# Patient Record
Sex: Female | Born: 1980 | Hispanic: No | Marital: Married | Smoking: Never smoker
Health system: Southern US, Community
[De-identification: ages and names within clinical notes are randomized; demographics above are authoritative.]

## PROBLEM LIST (undated history)

## (undated) ENCOUNTER — Inpatient Hospital Stay (HOSPITAL_COMMUNITY): Payer: Self-pay

## (undated) DIAGNOSIS — O34219 Maternal care for unspecified type scar from previous cesarean delivery: Secondary | ICD-10-CM

## (undated) DIAGNOSIS — O26612 Liver and biliary tract disorders in pregnancy, second trimester: Secondary | ICD-10-CM

## (undated) DIAGNOSIS — F329 Major depressive disorder, single episode, unspecified: Secondary | ICD-10-CM

## (undated) DIAGNOSIS — D509 Iron deficiency anemia, unspecified: Secondary | ICD-10-CM

## (undated) DIAGNOSIS — F32A Depression, unspecified: Secondary | ICD-10-CM

## (undated) DIAGNOSIS — D62 Acute posthemorrhagic anemia: Secondary | ICD-10-CM

## (undated) DIAGNOSIS — K5909 Other constipation: Secondary | ICD-10-CM

## (undated) DIAGNOSIS — K831 Obstruction of bile duct: Secondary | ICD-10-CM

## (undated) DIAGNOSIS — E559 Vitamin D deficiency, unspecified: Secondary | ICD-10-CM

## (undated) HISTORY — DX: Other constipation: K59.09

## (undated) HISTORY — DX: Vitamin D deficiency, unspecified: E55.9

---

## 2011-10-25 ENCOUNTER — Other Ambulatory Visit: Payer: Self-pay | Admitting: Infectious Diseases

## 2011-10-25 ENCOUNTER — Ambulatory Visit
Admission: RE | Admit: 2011-10-25 | Discharge: 2011-10-25 | Disposition: A | Discharge status: Home / Self Care | Payer: No Typology Code available for payment source | Source: Ambulatory Visit | Attending: Infectious Diseases | Admitting: Infectious Diseases

## 2011-10-25 DIAGNOSIS — R7611 Nonspecific reaction to tuberculin skin test without active tuberculosis: Secondary | ICD-10-CM

## 2012-07-28 ENCOUNTER — Encounter (HOSPITAL_COMMUNITY): Payer: Self-pay | Admitting: *Deleted

## 2012-07-28 ENCOUNTER — Inpatient Hospital Stay (HOSPITAL_COMMUNITY): Payer: BC Managed Care – PPO

## 2012-07-28 ENCOUNTER — Inpatient Hospital Stay (HOSPITAL_COMMUNITY)
Admission: AD | Admit: 2012-07-28 | Discharge: 2012-07-28 | Disposition: A | Discharge status: Home / Self Care | Payer: BC Managed Care – PPO | Source: Ambulatory Visit | Attending: Obstetrics & Gynecology | Admitting: Obstetrics & Gynecology

## 2012-07-28 DIAGNOSIS — Z3490 Encounter for supervision of normal pregnancy, unspecified, unspecified trimester: Secondary | ICD-10-CM

## 2012-07-28 DIAGNOSIS — R55 Syncope and collapse: Secondary | ICD-10-CM

## 2012-07-28 DIAGNOSIS — R109 Unspecified abdominal pain: Secondary | ICD-10-CM | POA: Insufficient documentation

## 2012-07-28 DIAGNOSIS — M549 Dorsalgia, unspecified: Secondary | ICD-10-CM | POA: Insufficient documentation

## 2012-07-28 DIAGNOSIS — O99891 Other specified diseases and conditions complicating pregnancy: Secondary | ICD-10-CM | POA: Insufficient documentation

## 2012-07-28 LAB — URINALYSIS, ROUTINE W REFLEX MICROSCOPIC
Bilirubin Urine: NEGATIVE
Ketones, ur: NEGATIVE mg/dL
Protein, ur: NEGATIVE mg/dL
Urobilinogen, UA: 0.2 mg/dL (ref 0.0–1.0)

## 2012-07-28 LAB — URINE MICROSCOPIC-ADD ON

## 2012-07-28 NOTE — MAU Note (Signed)
Lower abd & back pain x 1 week, worse at night.  White discharge, no bleeding.  +HPT & +UPT at medical clinic.

## 2012-07-28 NOTE — MAU Provider Note (Signed)
Chief Complaint: Abdominal Pain and Back Pain   First Provider Initiated Contact with Patient 07/28/12 1742     SUBJECTIVE HPI: Kristine [name] is a 31 y.o. G1P0 at [redacted]w[redacted]d by LMP who presents with 1 wk hx lower abd crampy pain and LBP. Vaginal discharge not irritative but more than usual amount. No vaginal bleeding. No dysuria, urgency, frequency. No N/V/C/D/fever.   No past medical history on file. OB History    Grav Para Term Preterm Abortions TAB SAB Ect Mult Living   1              # Outc Date GA Lbr Len/2nd Wgt Sex Del Anes PTL Lv   1 CUR              No past surgical history on file. History   Social History  . Marital Status: Married    Spouse Name: N/A    Number of Children: N/A  . Years of Education: N/A   Occupational History  . Not on file.   Social History Main Topics  . Smoking status: Never Smoker   . Smokeless tobacco: Not on file  . Alcohol Use: No  . Drug Use: No  . Sexually Active:    Other Topics Concern  . Not on file   Social History Narrative  . No narrative on file   No current facility-administered medications on file prior to encounter.   No current outpatient prescriptions on file prior to encounter.   No Known Allergies  ROS: Pertinent items in HPI  OBJECTIVE Blood pressure 101/68, pulse 84, temperature 97.9 F (36.6 C), temperature source Oral, resp. rate 16, height 5\' 3"  (1.6 m), weight 133 lb 12.8 oz (60.691 kg), last menstrual period 05/28/2012. GENERAL: Well-developed, well-nourished female in no acute distress.  HEENT: Normocephalic HEART: normal rate RESP: normal effort ABDOMEN: Soft, non-tender EXTREMITIES: Nontender, no edema NEURO: Alert and oriented SPECULUM EXAM: NEFG, physiologic discharge, no blood noted, cervix clean BIMANUAL: cervix L/C; uterus 8 wk size, no adnexal tenderness or masses  LAB RESULTS Results for orders placed during the hospital encounter of 07/28/12 (from the past 24 hour(s))  WET PREP, GENITAL      Status: Abnormal   Collection Time   07/28/12  5:50 PM      Component Value Range   Yeast Wet Prep HPF POC NONE SEEN  NONE SEEN   Trich, Wet Prep NONE SEEN  NONE SEEN   Clue Cells Wet Prep HPF POC NONE SEEN  NONE SEEN   WBC, Wet Prep HPF POC FEW (*) NONE SEEN    IMAGING Bedside US> fetal activity and normal HR *RADIOLOGY REPORT*  Clinical Data: 31 year old female with pain, positive urine pregnancy test. Gestational age by LMP 8 weeks 5 days.  OBSTETRIC <14 WK ULTRASOUND  Technique: Transabdominal ultrasound was performed for evaluation of the gestation as well as the maternal uterus and adnexal regions.  Comparison: None.  Intrauterine gestational sac: Single Yolk sac: Visible Embryo: Visible Cardiac Activity: Detected Heart Rate: 176 bpm  CRL: 2.4 mm 9 w 1 d [place] EDC: 03/01/2013  Maternal uterus/Adnexae: No subchorionic hemorrhage or pelvic free fluid. Normal left ovary measuring 2.9 x 1.7 x 1.5 cm. Slightly larger right ovary likely containing a corpus luteum measures 3.4 x 2.9 x 2.6 cm (and corpus luteum up to 2.6 cm.  IMPRESSION: Viable singleton intrauterine pregnancy with estimated gestational age of [redacted] weeks and 1 days by crown-rump length.   Original Report Authenticated By: Odessa Fleming  III, M.D.    ASSESSMENT 1. Abdominal pain complicating pregnancy   2. Viable pregnancy    G1 @[redacted]w[redacted]d  BEGA by LMP confirmed by [place]  PLAN Discharge home    Medication List     As of 07/28/2012  7:33 PM    TAKE these medications         prenatal multivitamin Tabs   Take 1 tablet by mouth at bedtime.          Danae Orleans, CNM 07/28/2012  5:54 PM

## 2012-07-28 NOTE — MAU Provider Note (Signed)
Attestation of Attending Supervision of Advanced Practitioner (CNM/NP): Evaluation and management procedures were performed by the Advanced Practitioner under my supervision and collaboration.  I have reviewed the Advanced Practitioner's note and chart, and I agree with the management and plan.  HARRAWAY-SMITH, Duane Trias 7:41 PM     

## 2012-07-29 LAB — GC/CHLAMYDIA PROBE AMP: GC Probe RNA: NEGATIVE

## 2012-08-11 ENCOUNTER — Inpatient Hospital Stay (HOSPITAL_COMMUNITY)
Admission: AD | Admit: 2012-08-11 | Discharge: 2012-08-11 | Disposition: A | Discharge status: Home / Self Care | Payer: BC Managed Care – PPO | Source: Ambulatory Visit | Attending: Obstetrics and Gynecology | Admitting: Obstetrics and Gynecology

## 2012-08-11 ENCOUNTER — Encounter (HOSPITAL_COMMUNITY): Payer: Self-pay | Admitting: *Deleted

## 2012-08-11 DIAGNOSIS — R55 Syncope and collapse: Secondary | ICD-10-CM

## 2012-08-11 DIAGNOSIS — O2650 Maternal hypotension syndrome, unspecified trimester: Secondary | ICD-10-CM | POA: Insufficient documentation

## 2012-08-11 DIAGNOSIS — Z3490 Encounter for supervision of normal pregnancy, unspecified, unspecified trimester: Secondary | ICD-10-CM

## 2012-08-11 LAB — COMPREHENSIVE METABOLIC PANEL
ALT: 11 U/L (ref 0–35)
AST: 12 U/L (ref 0–37)
Albumin: 3.4 g/dL — ABNORMAL LOW (ref 3.5–5.2)
CO2: 18 mEq/L — ABNORMAL LOW (ref 19–32)
Calcium: 8.8 mg/dL (ref 8.4–10.5)
GFR calc non Af Amer: >90 mL/min (ref >90)
Sodium: 133 mEq/L — ABNORMAL LOW (ref 135–145)

## 2012-08-11 LAB — CBC WITH DIFFERENTIAL/PLATELET
Basophils Absolute: 0 10*3/uL (ref 0.0–0.1)
Eosinophils Relative: 1 % (ref 0–5)
Lymphocytes Relative: 34 % (ref 12–46)
MCV: 86.4 fL (ref 78.0–100.0)
Neutro Abs: 4.7 10*3/uL (ref 1.7–7.7)
Platelets: 264 10*3/uL (ref 150–400)
RDW: 13 % (ref 11.5–15.5)
WBC: 7.9 10*3/uL (ref 4.0–10.5)

## 2012-08-11 LAB — URINALYSIS, ROUTINE W REFLEX MICROSCOPIC
Bilirubin Urine: NEGATIVE
Glucose, UA: NEGATIVE mg/dL
Specific Gravity, Urine: 1.01 (ref 1.005–1.030)
Urobilinogen, UA: 0.2 mg/dL (ref 0.0–1.0)
pH: 8 (ref 5.0–8.0)

## 2012-08-11 LAB — URINE MICROSCOPIC-ADD ON

## 2012-08-11 MED ORDER — DEXTROSE IN LACTATED RINGERS 5 % IV SOLN
INTRAVENOUS | Status: DC
  Administered 2012-08-11: 11:00:00 via INTRAVENOUS

## 2012-08-11 NOTE — MAU Note (Signed)
Pt refused wc to car

## 2012-08-11 NOTE — MAU Note (Signed)
Body feels numb- heavy, can not move. But can feel touch.

## 2012-08-11 NOTE — MAU Provider Note (Signed)
History     CSN: [id number]  Arrival date and time: 08/11/12 4098   None     Chief Complaint  Patient presents with  . Loss of Consciousness   HPI Aimi Essner is 31 y.o. G1P0 [redacted]w[redacted]d weeks presented by EMS after a syncopal episode at work.  She has not had anything to eat since 9pm last.  Episode was witnessed.  Patient slide down to the floor.  Denies injury.  States her body is all numb and she is having difficulty speaking per her report.  Her mother is here and denies history of seizures.  She has not begun prenatal care, waiting for Medicaid approval.   Patient is able to answer questions.      History reviewed. No pertinent past medical history.  History reviewed. No pertinent past surgical history.  Family History  Problem Relation Age of Onset  . Other Neg Hx   . Hearing loss Neg Hx     History  Substance Use Topics  . Smoking status: Never Smoker   . Smokeless tobacco: Never Used  . Alcohol Use: No    Allergies: No Known Allergies  Prescriptions prior to admission  Medication Sig Dispense Refill  . Prenatal Vit-Fe Fumarate-FA (PRENATAL MULTIVITAMIN) TABS Take 1 tablet by mouth at bedtime.        Review of Systems  Neurological: Positive for speech change.       Generalized numbness.  Difficulty speaking  Syncope   Physical Exam   Last menstrual period 05/28/2012.  Physical Exam  Constitutional: She appears well-developed and well-nourished.       Difficulty speaking.  HENT:  Head: Normocephalic.  Respiratory: Effort normal.  Genitourinary:       Not indicated  Neurological:       Patient unable to squeeze by fingers.  Speech is quiet, difficult to ellicit conversation but does answer questions  Skin: Skin is warm and dry.   Results for orders placed during the hospital encounter of 08/11/12 (from the past 24 hour(s))  CBC WITH DIFFERENTIAL     Status: Abnormal   Collection Time   08/11/12 10:35 AM      Component Value Range   WBC 7.9   4.0 - 10.5 K/uL   RBC 3.81 (*) 3.87 - 5.11 MIL/uL   Hemoglobin 11.6 (*) 12.0 - 15.0 g/dL   HCT 11.9 (*) 14.7 - 82.9 %   MCV 86.4  78.0 - 100.0 fL   MCH 30.4  26.0 - 34.0 pg   MCHC 35.3  30.0 - 36.0 g/dL   RDW 56.2  13.0 - 86.5 %   Platelets 264  150 - 400 K/uL   Neutrophils Relative 60  43 - 77 %   Neutro Abs 4.7  1.7 - 7.7 K/uL   Lymphocytes Relative 34  12 - 46 %   Lymphs Abs 2.7  0.7 - 4.0 K/uL   Monocytes Relative 6  3 - 12 %   Monocytes Absolute 0.4  0.1 - 1.0 K/uL   Eosinophils Relative 1  0 - 5 %   Eosinophils Absolute 0.1  0.0 - 0.7 K/uL   Basophils Relative 0  0 - 1 %   Basophils Absolute 0.0  0.0 - 0.1 K/uL  COMPREHENSIVE METABOLIC PANEL     Status: Abnormal   Collection Time   08/11/12 10:35 AM      Component Value Range   Sodium 133 (*) 135 - 145 mEq/L   Potassium 3.0 (*) 3.5 -  5.1 mEq/L   Chloride 102  96 - 112 mEq/L   CO2 18 (*) 19 - 32 mEq/L   Glucose, Bld 90  70 - 99 mg/dL   BUN 7  6 - 23 mg/dL   Creatinine, Ser 1.61 (*) 0.50 - 1.10 mg/dL   Calcium 8.8  8.4 - 09.6 mg/dL   Total Protein 6.3  6.0 - 8.3 g/dL   Albumin 3.4 (*) 3.5 - 5.2 g/dL   AST 12  0 - 37 U/L   ALT 11  0 - 35 U/L   Alkaline Phosphatase 50  39 - 117 U/L   Total Bilirubin 0.6  0.3 - 1.2 mg/dL   GFR calc non Af Amer >90  >90 mL/min   GFR calc Af Amer >90  >90 mL/min  URINALYSIS, ROUTINE W REFLEX MICROSCOPIC     Status: Abnormal   Collection Time   08/11/12 10:55 AM      Component Value Range   Color, Urine YELLOW  YELLOW   APPearance CLEAR  CLEAR   Specific Gravity, Urine 1.010  1.005 - 1.030   pH 8.0  5.0 - 8.0   Glucose, UA NEGATIVE  NEGATIVE mg/dL   Hgb urine dipstick TRACE (*) NEGATIVE   Bilirubin Urine NEGATIVE  NEGATIVE   Ketones, ur 15 (*) NEGATIVE mg/dL   Protein, ur NEGATIVE  NEGATIVE mg/dL   Urobilinogen, UA 0.2  0.0 - 1.0 mg/dL   Nitrite NEGATIVE  NEGATIVE   Leukocytes, UA NEGATIVE  NEGATIVE  URINE MICROSCOPIC-ADD ON     Status: Abnormal   Collection Time   08/11/12  10:55 AM      Component Value Range   Squamous Epithelial / LPF FEW (*) RARE   WBC, UA 0-2  <3 WBC/hpf    MAU Course  Procedures  MDM 10:32  Reported MSE to Dr. Jolayne Panther.  Asked her to come to evaluate patient. 10:36  Dr. Jolayne Panther in the room.  She examined patient as well.   CBC diff and CMP pending.  Dr. Jolayne Panther said if labs are normal, she spoke to the patient about sending her for neuro eval at [facility].  Will continue IV hydration.   12:00 Jolynn, RN reports the patient is much better with  Normal orthostatic BPs.  Reported to Dr. Jolayne Panther.  May discharge to home.  No further evaluation needed. I went in to see patient.  She is alert, oriented, talking, smiling and ready for discharge.  Discussed importance to eat 3 meals and mid-meal snack and stay well hydrated.  Prenatal Vits daily.  Begin prenatal care  Assessment and Plan  A:  Syncopal episode at [redacted]w[redacted]d      IV hydration  P:  Discussed importance of staying well hydrated and eat 5-6 small meals a day     Prenatal Vits qd     Add bananas to diet for K+    Begin prenatal care  Romeka Scifres,EVE M 08/11/2012, 10:25 AM

## 2012-08-11 NOTE — MAU Provider Note (Signed)
Attestation of Attending Supervision of Advanced Practitioner (CNM/NP): Evaluation and management procedures were performed by the Advanced Practitioner under my supervision and collaboration.  I have reviewed the Advanced Practitioner's note and chart, and I agree with the management and plan.  Jaidev Sanger 08/11/2012 3:25 PM

## 2012-08-11 NOTE — MAU Note (Addendum)
Has been having nausea and vomiting, denies diarrhea or fever. Was at work, became dizzy- "sat down' on the floor".  Co-worker told emds she passed out.  Pt confused on arrival- states was at work.  Was sick last night.  Can not remember birthday.

## 2012-08-11 NOTE — MAU Note (Signed)
"  she's back". Alert, color has improved, eyes bright and responsive.  Able to move all limbs; numbness is gone.  Feeling better. Conversing- oriented. Knows birthdate now.

## 2012-09-11 LAB — OB RESULTS CONSOLE HIV ANTIBODY (ROUTINE TESTING): HIV: NONREACTIVE

## 2012-09-11 LAB — OB RESULTS CONSOLE RUBELLA ANTIBODY, IGM: Rubella: IMMUNE

## 2012-09-11 LAB — OB RESULTS CONSOLE HEPATITIS B SURFACE ANTIGEN: Hepatitis B Surface Ag: NEGATIVE

## 2012-09-16 LAB — OB RESULTS CONSOLE GC/CHLAMYDIA
Chlamydia: NEGATIVE
Gonorrhea: NEGATIVE

## 2012-10-26 ENCOUNTER — Inpatient Hospital Stay (HOSPITAL_COMMUNITY): Payer: BC Managed Care – PPO

## 2012-10-26 ENCOUNTER — Encounter (HOSPITAL_COMMUNITY): Payer: Self-pay | Admitting: Obstetrics and Gynecology

## 2012-10-26 ENCOUNTER — Inpatient Hospital Stay (HOSPITAL_COMMUNITY)
Admission: AD | Admit: 2012-10-26 | Discharge: 2012-10-26 | Disposition: A | Discharge status: Home / Self Care | Payer: BC Managed Care – PPO | Source: Ambulatory Visit | Attending: Obstetrics & Gynecology | Admitting: Obstetrics & Gynecology

## 2012-10-26 DIAGNOSIS — O99891 Other specified diseases and conditions complicating pregnancy: Secondary | ICD-10-CM | POA: Insufficient documentation

## 2012-10-26 LAB — URINALYSIS, ROUTINE W REFLEX MICROSCOPIC
Leukocytes, UA: NEGATIVE
Nitrite: NEGATIVE
Specific Gravity, Urine: 1.015 (ref 1.005–1.030)
Urobilinogen, UA: 0.2 mg/dL (ref 0.0–1.0)
pH: 7 (ref 5.0–8.0)

## 2012-10-26 LAB — POCT FERN TEST: POCT Fern Test: NEGATIVE

## 2012-10-26 NOTE — MAU Note (Addendum)
"  At 0930 this morning, I wasn't having any pain but a lot of water came out.  I thought I was peeing on myself, but I had just gone to the [place].  My pants and underwear all got wet.  About 1.5-2 hours later, I started having pressure in my vagina.  My friend called the afterhours nurse and she told us to come right away."

## 2012-10-26 NOTE — Progress Notes (Signed)
Dr. Juliene Pina performing bedside U/S

## 2012-10-26 NOTE — MAU Note (Signed)
Pt presents with complaints of a large gush of clear fluid at 9:30 this morning, States some right lower back pain since she had the leakage. Denies any bleeding

## 2012-10-26 NOTE — MAU Provider Note (Signed)
  History     CSN: [id number]  Arrival date and time: 10/26/12 1420  First Provider Initiated Contact with Patient 10/26/12 1454    Chief Complaint  Patient presents with  . Possible rupture of membranes    HPI 32 yo, G1 at 22 wks, here for leaking fluid and pelvic pressure. Felt gush of fluid at 10 am this morning and cramping since few hours. Was seen in office few days back for decreased FMs, but has been feeling good FMs. No vaginal bleeding.   Family History  Problem Relation Age of Onset  . Other Neg Hx   . Hearing loss Neg Hx     History  Substance Use Topics  . Smoking status: Never Smoker   . Smokeless tobacco: Never Used  . Alcohol Use: No    Allergies: No Known Allergies  Prescriptions prior to admission  Medication Sig Dispense Refill  . Prenatal Vit-Fe Fumarate-FA (PRENATAL MULTIVITAMIN) TABS Take 1 tablet by mouth at bedtime.        ROS Physical Exam  Physical Exam Blood pressure 117/74, pulse 97, temperature 97.9 F (36.6 C), temperature source Oral, resp. rate 18, height 5\' 2"  (1.575 m), weight 143 lb (64.864 kg), last menstrual period 05/28/2012. A&O x 3, NAD Abdomen soft, non tender, no palpable contractions. Speculum exam- neg pooling and neg ferning. Cervix appeared long and closed. NO bleeding   MAU Course  Procedures Pelvic sono for AFI and CL were ordered but there is long wait time and hence bedside sono performed, noted grossly normal amniotic fluid, active fetal movements and fetal cardiac activity. Cervix appeared closed without any funneling.   Assessment and Plan  22 wks pregnancy, no evidence of membrane rupture and no evidence of preterm labor.  PTL and PROM warning signs reviewed. F/up in office in 2 wks as scheduled and sooner as needed.  Loreen Bankson R 10/26/2012, 2:57 PM

## 2012-12-12 ENCOUNTER — Encounter (INDEPENDENT_AMBULATORY_CARE_PROVIDER_SITE_OTHER): Payer: Self-pay

## 2012-12-12 ENCOUNTER — Ambulatory Visit (INDEPENDENT_AMBULATORY_CARE_PROVIDER_SITE_OTHER): Admitting: Surgery

## 2012-12-12 ENCOUNTER — Encounter (INDEPENDENT_AMBULATORY_CARE_PROVIDER_SITE_OTHER): Payer: Self-pay | Admitting: Surgery

## 2012-12-12 VITALS — BP 100/60 | HR 78 | Temp 98.5°F | Resp 18 | Ht 63.0 in | Wt 145.4 lb

## 2012-12-12 DIAGNOSIS — K5909 Other constipation: Secondary | ICD-10-CM

## 2012-12-12 DIAGNOSIS — K5900 Constipation, unspecified: Secondary | ICD-10-CM

## 2012-12-12 DIAGNOSIS — K645 Perianal venous thrombosis: Secondary | ICD-10-CM | POA: Insufficient documentation

## 2012-12-12 HISTORY — DX: Other constipation: K59.09

## 2012-12-12 MED ORDER — OXYCODONE HCL 5 MG PO TABS: 5.0000 mg | ORAL_TABLET | Freq: Four times a day (QID) | ORAL | Status: DC | PRN

## 2012-12-12 NOTE — Patient Instructions (Addendum)
ANORECTAL SURGERY: POST OP INSTRUCTIONS  1. Take your usually prescribed home medications unless otherwise directed. 2. DIET: Follow a light bland diet the first 24 hours after arrival home, such as soup, liquids, crackers, etc.  Be sure to include lots of fluids daily.  Avoid fast food or heavy meals as your are more likely to get nauseated.  Eat a low fat the next few days after surgery.   3. PAIN CONTROL: a. Pain is best controlled by a usual combination of three different methods TOGETHER: i. Ice/Heat ii. Over the counter pain medication iii. Prescription pain medication b. Most patients will experience some swelling and discomfort in the anus/rectal area. and incisions.  Ice packs or heat (30-60 minutes up to 6 times a day) will help. Use ice for the first few days to help decrease swelling and bruising, then switch to heat such as warm towels, sitz baths, warm baths, etc to help relax tight/sore spots and speed recovery.  Some people prefer to use ice alone, heat alone, alternating between ice & heat.  Experiment to what works for you.  Swelling and bruising can take several weeks to resolve.   i. It is helpful to take an over-the-counter pain medication regularly for the first few weeks.  Choose Acetaminophen (Tylenol, etc) 500-650mg  four times a day (every meal & bedtime) c. A  prescription for pain medication (such as oxycodone, hydrocodone, etc) should be given to you upon discharge.  Take your pain medication as prescribed.  i. If you are having problems/concerns with the prescription medicine (does not control pain, nausea, vomiting, rash, itching, etc), please call us [phone] to see if we need to switch you to a different pain medicine that will work better for you and/or control your side effect better. ii. If you need a refill on your pain medication, please contact your pharmacy.  They will contact our office to request authorization. Prescriptions will not be filled after 5 pm or  on week-ends. 4. KEEP YOUR BOWELS REGULAR a. The goal is one bowel movement a day b. Avoid getting constipated.  Between the surgery and the pain medications, it is common to experience some constipation.  Increasing fluid intake and taking a fiber supplement (such as Metamucil, Citrucel, FiberCon, MiraLax, etc) 1-2 times a day regularly will usually help prevent this problem from occurring.  A mild laxative (prune juice, Milk of Magnesia, MiraLax, etc) should be taken according to package directions if there are no bowel movements after 48 hours. c. Watch out for diarrhea.  If you have many loose bowel movements, simplify your diet to bland foods & liquids for a few days.  Stop any stool softeners and decrease your fiber supplement.  Switching to mild anti-diarrheal medications (Kayopectate, Pepto Bismol) can help.  If this worsens or does not improve, please call us.  5. Wound Care a. Remove your bandages the day after surgery.  Unless discharge instructions indicate otherwise, leave your bandage dry and in place overnight.  Remove the bandage during your first bowel movement.   b. Allow the wound packing to fall out over the next few days.  You can trim exposed gauze / ribbon as it falls out.  You do not need to repack the wound unless instructed otherwise.  Wear an absorbent pad or soft cotton gauze in your underwear as needed to catch any drainage and help keep the area  c. Keep the area clean and dry.  Bathe / shower every day.  Keep  the area clean by showering / bathing over the incision / wound.   It is okay to soak an open wound to help wash it.  Wet wipes or showers / gentle washing after bowel movements is often less traumatic than regular toilet paper. d. Kristine [name] may have some styrofoam-like soft packing in the rectum which will come out with the first bowel movement.  e. You will often notice bleeding with bowel movements.  This should slow down by the end of the first week of surgery f. Expect  some drainage.  This should slow down, too, by the end of the first week of surgery.  Wear an absorbent pad or soft cotton gauze in your underwear until the drainage stops. 6. ACTIVITIES as tolerated:   a. You may resume regular (light) daily activities beginning the next day-such as daily self-care, walking, climbing stairs-gradually increasing activities as tolerated.  If you can walk 30 minutes without difficulty, it is safe to try more intense activity such as jogging, treadmill, bicycling, low-impact aerobics, swimming, etc. b. Save the most intensive and strenuous activity for last such as sit-ups, heavy lifting, contact sports, etc  Refrain from any heavy lifting or straining until you are off narcotics for pain control.   c. DO NOT PUSH THROUGH PAIN.  Let pain be your guide: If it hurts to do something, don't do it.  Pain is your body warning you to avoid that activity for another week until the pain goes down. d. You may drive when you are no longer taking prescription pain medication, you can comfortably sit for long periods of time, and you can safely maneuver your car and apply brakes. e. Kristine [name] may have sexual intercourse when it is comfortable.  7. FOLLOW UP in our office a. Please call [facility] at [phone] to set up an appointment to see your surgeon in the office for a follow-up appointment approximately 2 weeks after your surgery. b. Make sure that you call for this appointment the day you arrive home to insure a convenient appointment time. 10. IF YOU HAVE DISABILITY OR FAMILY LEAVE FORMS, BRING THEM TO THE OFFICE FOR PROCESSING.  DO NOT GIVE THEM TO YOUR DOCTOR.        WHEN TO CALL us [phone]: 1. Poor pain control 2. Reactions / problems with new medications (rash/itching, nausea, etc)  3. Fever over 101.5 F (38.5 C) 4. Inability to urinate 5. Nausea and/or vomiting 6. Worsening swelling or bruising 7. Continued bleeding from incision. 8. Increased pain, redness, or  drainage from the incision  The clinic staff is available to answer your questions during regular business hours (8:30am-5pm).  Please don't hesitate to call and ask to speak to one of our nurses for clinical concerns.   A surgeon from [facility] is always on call at the hospitals   If you have a medical emergency, go to the nearest emergency room or call 911.    [facility] [address], [address], [place], [place]  [address] ? MAIN: [phone] ? TOLL FREE: [phone] ? FAX [phone] [email or web address]   HEMORRHOIDS  The rectum is the last foot of your colon, and it naturally stretches to hold stool.  Hemorrhoidal piles are natural clusters of blood vessels that help the rectum and anal canal stretch to hold stool and allow bowel movements to eliminate feces.   Hemorrhoids are abnormally swollen blood vessels in the rectum.  Too much pressure in the rectum  causes hemorrhoids by forcing blood to stretch and bulge the walls of the veins, sometimes even rupturing them.  Hemorrhoids can become like varicose veins you might see on a person's legs.  Most people will develop a flare of hemorrhoids in their lifetime.  When bulging hemorrhoidal veins are irritated, they can swell, burn, itch, cause pain, and bleed.  Most flares will calm down gradually own within a few weeks.  However, once hemorrhoids are created, they are difficult to get rid of completely and tend to flare more easily than the first flare.   Fortunately, good habits and simple medical treatment usually control hemorrhoids well, and surgery is needed only in severe cases. Types of Hemorrhoids:  Internal hemorrhoids usually don't initially hurt or itch; they are deep inside the rectum and usually have no sensation. If they begin to push out (prolapse), pain and burning can occur.  However, internal hemorrhoids can bleed.  Anal bleeding should not be ignored since bleeding  could come from a dangerous source like colorectal cancer, so persistent rectal bleeding should be investigated by a doctor, sometimes with a colonoscopy.  External hemorrhoids cause most of the symptoms - pain, burning, and itching. Nonirritated hemorrhoids can look like small skin tags coming out of the anus.   Thrombosed hemorrhoids can form when a hemorrhoid blood vessel bursts and causes the hemorrhoid to suddenly swell.  A purple blood clot can form in it and become an excruciatingly painful lump at the anus. Because of these unpleasant symptoms, immediate incision and drainage by a surgeon at an office visit can provide much relief of the pain.    PREVENTION Avoiding the most frequent causes listed below will prevent most cases of hemorrhoids: Constipation Hard stools Diarrhea  Constant sitting  Straining with bowel movements Sitting on the toilet for a long time  Severe coughing  episodes Pregnancy / Childbirth  Heavy Lifting  Sometimes avoiding the above triggers is difficult:  How can you avoid sitting all day if you have a seated job? Also, we try to avoid coughing and diarrhea, but sometimes it's beyond your control.  Still, there are some practical hints to help: Keep the anal and genital area clean.  Moistened tissues such as flushable wet wipes are less irritating than toilet paper.  Using irrigating showers or bottle irrigation washing gently cleans this sensitive area.   Avoid dry toilet paper when cleaning after bowel movements.  Marland Kitchen Keep the anal and genital area dry.  Lightly pat the rectal area dry.  Avoid rubbing.  Talcum or baby powders can help GET YOUR STOOLS SOFT.   This is the most important way to prevent irritated hemorrhoids.  Hard stools are like sandpaper to the anorectal canal and will cause more problems.  The goal: ONE SOFT BOWEL MOVEMENT A DAY!  BMs from every other day to 3 times a day is a tolerable range Treat coughing, diarrhea and constipation early since  irritated hemorrhoids may soon follow.  If your main job activity is seated, always stand or walk during your breaks. Make it a point to stand and walk at least 5 minutes every hour and try to shift frequently in your chair to avoid direct rectal pressure.  Always exhale as you strain or lift. Don't hold your breath.  Do not delay or try to prevent a bowel movement when the urge is present. Exercise regularly (walking or jogging 60 minutes a day) to stimulate the bowels to move. No reading or other activity while  on the toilet. If bowel movements take longer than 5 minutes, you are too constipated. AVOID CONSTIPATION Drink plenty of liquids (1 1/2 to 2 quarts of water and other fluids a day unless fluid restricted for another medical condition). Liquids that contain caffeine (coffee a, tea, soft drinks) can be dehydrating and should be avoided until constipation is controlled. Consider minimizing milk, as dairy products may be constipating. Eat plenty of fiber (30g a day ideal, more if needed).  Fiber is the undigested part of plant food that passes into the colon, acting as "natures broom" to encourage bowel motility and movement.  Fiber can absorb and hold large amounts of water. This results in a larger, bulkier stool, which is soft and easier to pass.  Eating foods high in fiber - 12 servings - such as  Vegetables: Root (potatoes, carrots, turnips), Leafy green (lettuce, salad greens, celery, spinach), High residue (cabbage, broccoli, etc.) Fruit: Fresh, Dried (prunes, apricots, cherries), Stewed (applesauce)  Whole grain breads, pasta, whole wheat Bran cereals, muffins, etc. Consider adding supplemental bulking fiber which retains large volumes of water: Psyllium ground seeds --available as Metamucil, Konsyl, Effersyllium, Per Diem Fiber, or the less expensive generic forms.  Citrucel  (methylcellulose wood fiber) . FiberCon (Polycarbophil) Polyethylene Glycol - and "artificial" fiber commonly  called Miralax or Glycolax.  It is helpful for people with gassy or bloated feelings with regular fiber Flax Seed - a less gassy natural fiber  Laxatives can be useful for a short period if constipation is severe Osmotics (Milk of Magnesia, Fleets Phospho-Soda, Magnesium Citrate)  Stimulants (Senokot,   Castor Oil,  Dulcolax, Ex-Lax)    Laxatives are not a good long-term solution as it can stress the bowels and cause too much mineral loss and dehydration.   Avoid taking laxatives for more than 7 days in a row.  AVOID DIARRHEA Switch to liquids and simpler foods for a few days to avoid stressing your intestines further. Avoid dairy products (especially milk & ice cream) for a short time.  The intestines often can lose the ability to digest lactose when stressed. Avoid foods that cause gassiness or bloating.  Typical foods include beans and other legumes, cabbage, broccoli, and dairy foods.  Every person has some sensitivity to other foods, so listen to your body and avoid those foods that trigger problems for you. Adding fiber (Citrucel, Metamucil, FiberCon, Flax seed, Miralax) gradually can help thicken stools by absorbing excess fluid and retrain the intestines to act more normally.  Slowly increase the dose over a few weeks.  Too much fiber too soon can backfire and cause cramping & bloating. Probiotics (such as active yogurt, Align, etc) may help repopulate the intestines and colon with normal bacteria and calm down a sensitive digestive tract.  Most studies show it to be of mild help, though, and such products can be costly. Medicines: Bismuth subsalicylate (ex. Kayopectate, Pepto Bismol) every 30 minutes for up to 6 doses can help control diarrhea.  Avoid if pregnant. Loperamide (Immodium) can slow down diarrhea.  Start with two tablets (4mg  total) first and then try one tablet every 6 hours.  Avoid if you are having fevers or severe pain.  If you are not better or start feeling worse, stop all  medicines and call your doctor for advice Call your doctor if you are getting worse or not better.  Sometimes further testing (cultures, endoscopy, X-ray studies, bloodwork, etc) may be needed to help diagnose and treat the cause of the diarrhea.  TREATMENT OF HEMORRHOID FLARE If these preventive measures fail, you must take action right away! Hemorrhoids are one condition that can be mild in the morning and become intolerable by nightfall. Most hemorrhoidal flares take several weeks to calm down.  These suggestions can help: Warm soaks.  This helps more than any topical medication.  Use up to 8 times a day.  Usually sitz baths or sitting in a warm bathtub helps.  Sitting on moist warm towels are helpful.  Switching to ice packs/cool compresses can be helpful Normalize your bowels.  Extremes of diarrhea or constipation will make hemorrhoids worse.  One soft bowel movement a day is the goal.  Fiber can help get your bowels regular Wet wipes instead of toilet paper Pain control with a NSAID such as ibuprofen (Advil) or naproxen (Aleve) or acetaminophen (Tylenol) around the clock.  Narcotics are constipating and should be minimized if possible Topical creams contain steroids (bydrocortisone) or local anesthetic (xylocaine) can help make pain and itching more tolerable.   EVALUATION If hemorrhoids are still causing problems, you could benefit by an evaluation by a surgeon.  The surgeon will obtain a history and examine you.  If hemorrhoids are diagnosed, some therapies can be offered in the office, usually with an anoscope into the less sensitive area of the rectum: -injection of hemorrhoids (sclerotherapy) can scar the blood vessels of the swollen/enlarged hemorrhoids to help shrink them down to a more normal size -rubber banding of the enlarged hemorrhoids to help shrink them down to a more normal size -drainage of the blood clot causing a thrombosed hemorrhoid,  to relieve the severe pain   While 90%  of the time such problems from hemorrhoids can be managed without preceding to surgery, sometimes the hemorrhoids require a operation to control the problem (uncontrolled bleeding, prolapse, pain, etc.).   This involves being placed under general anesthesia where the surgeon can confirm the diagnosis and remove, suture, or staple the hemorrhoid(s).  Your surgeon can help you treat the problem appropriately.

## 2012-12-12 NOTE — Progress Notes (Signed)
Subjective:     Patient ID: Kristine [name], female   DOB: July 04, 1981, 32 y.o.   MRN: [id number]  HPI  Kristine [name]  [birth year] [id number]  Patient Care Team: Robley Fries, MD as Consulting Physician (Obstetrics and Gynecology)  This patient is a 32 y.o.female who presents today for surgical evaluation at the request of Dr. Juliene Pina.   Reason for visit: Painful hemorrhoid.  Pleasant woman originally from [place].  Going into her third trimester.  Struggles with chronic constipation, especially since she moved to the [place].  Has a bowel movement once a week.  Three days sig oh, had a painful bowel movement and noticed pain and swelling.  His become severe.  She has had anymore problems in the past but usually then mild and treated with creams and suppositories.  Nothing is helping now.  She was concerned.  She saw her obstetrician.  Dr. Juliene Pina sent the patient to [place] for surgical evaluation.  No personal nor family history of GI/colon cancer, inflammatory bowel disease, irritable bowel syndrome, allergy such as Celiac Sprue, dietary/dairy problems, colitis, ulcers nor gastritis.  No recent sick contacts/gastroenteritis.  No travel outside the country.  No changes in diet.    Patient Active Problem List   Diagnosis Date Noted  . Hemorrhoids, external, thrombosed 12/12/2012  . Constipation, chronic 12/12/2012    Past Medical History  Diagnosis Date  . Medical history non-contributory     Past Surgical History  Procedure Laterality Date  . No past surgeries      History   Social History  . Marital Status: Married    Spouse Name: N/A    Number of Children: N/A  . Years of Education: N/A   Occupational History  . Not on file.   Social History Main Topics  . Smoking status: Never Smoker   . Smokeless tobacco: Never Used  . Alcohol Use: No  . Drug Use: No  . Sexually Active: Yes    Birth Control/ Protection: None   Other Topics Concern  . Not on file    Social History Narrative  . No narrative on file    Family History  Problem Relation Age of Onset  . Other Neg Hx   . Hearing loss Neg Hx   . Alcohol abuse Neg Hx   . Arthritis Neg Hx   . Asthma Neg Hx   . Birth defects Neg Hx   . Cancer Neg Hx   . COPD Neg Hx   . Depression Neg Hx   . Diabetes Neg Hx   . Drug abuse Neg Hx   . Early death Neg Hx   . Heart disease Neg Hx   . Hyperlipidemia Neg Hx   . Hypertension Neg Hx   . Kidney disease Neg Hx   . Learning disabilities Neg Hx   . Mental illness Neg Hx   . Mental retardation Neg Hx   . Miscarriages / Stillbirths Neg Hx   . Stroke Neg Hx   . Vision loss Neg Hx     Current Outpatient Prescriptions  Medication Sig Dispense Refill  . Docusate Calcium (STOOL SOFTENER PO) Take by mouth.      . IRON PO Take 1 tablet by mouth daily.      . Prenatal Vit-Fe Fumarate-FA (PRENATAL MULTIVITAMIN) TABS Take 1 tablet by mouth at bedtime.       No current facility-administered medications for this visit.     No Known Allergies  BP 100/60  Pulse  78  Temp(Src) 98.5 F (36.9 C) (Temporal)  Resp 18  Ht 5\' 3"  (1.6 m)  Wt 145 lb 6.4 oz (65.953 kg)  BMI 25.76 kg/m2  LMP 05/28/2012  No results found.   Review of Systems  Constitutional: Negative for fever, chills and diaphoresis.  HENT: Negative for ear pain, sore throat and trouble swallowing.   Eyes: Negative for photophobia and visual disturbance.  Respiratory: Negative for cough and choking.   Cardiovascular: Negative for chest pain and palpitations.  Gastrointestinal: Positive for anal bleeding and rectal pain. Negative for nausea, vomiting, abdominal pain, diarrhea, constipation and abdominal distention.  Genitourinary: Negative for dysuria, frequency and difficulty urinating.  Musculoskeletal: Negative for myalgias and gait problem.  Skin: Negative for color change, pallor and rash.  Neurological: Negative for dizziness, speech difficulty, weakness and numbness.   Hematological: Negative for adenopathy.  Psychiatric/Behavioral: Negative for confusion and agitation. The patient is not nervous/anxious.        Objective:   Physical Exam  Constitutional: She is oriented to person, place, and time. She appears well-developed and well-nourished. No distress.  HENT:  Head: Normocephalic.  Mouth/Throat: Oropharynx is clear and moist. No oropharyngeal exudate.  Eyes: Conjunctivae and EOM are normal. Pupils are equal, round, and reactive to light. No scleral icterus.  Neck: Normal range of motion. No tracheal deviation present.  Cardiovascular: Normal rate and intact distal pulses.   Pulmonary/Chest: Effort normal. No respiratory distress. She exhibits no tenderness.  Abdominal: Soft. She exhibits no distension. There is no tenderness. Hernia confirmed negative in the right inguinal area and confirmed negative in the left inguinal area.     Genitourinary: No vaginal discharge found.  Exam done with assistance of female Medical Assistant in the room.  Perianal skin clean with good hygiene.  No pruritis.  No pilonidal disease.  No fissure.  No abscess/fistula.    Anterior midline thrombosed hemorrhoid.  Exquisitely tender.  No external skin tags / hemorrhoids of significance.    Musculoskeletal: Normal range of motion. She exhibits no tenderness.  Lymphadenopathy:       Right: No inguinal adenopathy present.       Left: No inguinal adenopathy present.  Neurological: She is alert and oriented to person, place, and time. No cranial nerve deficit. She exhibits normal muscle tone. Coordination normal.  Skin: Skin is warm and dry. No rash noted. She is not diaphoretic.  Psychiatric: She has a normal mood and affect. Her behavior is normal.       Assessment:     Painful thrombosed external hemorrhoid     Plan:     Excision:  The anatomy & physiology of the anorectal region was discussed.  The pathophysiology of hemorrhoids and differential diagnosis  was discussed.  Natural history progression  of worsening swelling with eventual resolution over weeks to months was discussed.   Natural history progression  was discussed.   I stressed the importance of a bowel regimen to have daily soft bowel movements to minimize progression of disease.   Goal of one BM / day ideal.  Use of wet wipes, warm baths, avoiding straining, etc were emphasized. The patient's symptoms are not adequately controlled.  Therefore, I recommended incision to drain the thrombosed hemorrhoid..  I went over the technique, risks, benefits, and alternatives.   Questions were answered.  The patient expressed understanding & wished to proceed.  The patient was positioned in the lateral decubitus position.  I placed a field block of local anaesthetic.  Perianal &  rectal examination was done.  Rest of the hemorrhoidal piles seemed normal.  No rectal masses.  I incised & removed the thrombosed hemorrhoid longitidinally.  I closed the wound using 3-0 chromic running suture.  I left a small opening externally for drainage.  The patient a little bit of dizziness and shakiness.  110/60.  No tachycardia.  No syncope.  Fully oriented.However it was mild and went away after a few minutes.    Educational handouts further explaining the pathology, treatment options, and bowel regimen were given as well.   The patient expressed understanding.

## 2012-12-29 ENCOUNTER — Ambulatory Visit (INDEPENDENT_AMBULATORY_CARE_PROVIDER_SITE_OTHER): Payer: BC Managed Care – PPO | Admitting: Surgery

## 2012-12-29 ENCOUNTER — Encounter (INDEPENDENT_AMBULATORY_CARE_PROVIDER_SITE_OTHER): Payer: Self-pay | Admitting: Surgery

## 2012-12-29 VITALS — BP 98/68 | HR 70 | Resp 18 | Ht 63.0 in | Wt 150.0 lb

## 2012-12-29 DIAGNOSIS — K5909 Other constipation: Secondary | ICD-10-CM

## 2012-12-29 DIAGNOSIS — K645 Perianal venous thrombosis: Secondary | ICD-10-CM

## 2012-12-29 DIAGNOSIS — K5900 Constipation, unspecified: Secondary | ICD-10-CM

## 2012-12-29 NOTE — Patient Instructions (Signed)
HEMORRHOIDS  The rectum is the last foot of your colon, and it naturally stretches to hold stool.  Hemorrhoidal piles are natural clusters of blood vessels that help the rectum and anal canal stretch to hold stool and allow bowel movements to eliminate feces.   Hemorrhoids are abnormally swollen blood vessels in the rectum.  Too much pressure in the rectum causes hemorrhoids by forcing blood to stretch and bulge the walls of the veins, sometimes even rupturing them.  Hemorrhoids can become like varicose veins you might see on a person's legs.  Most people will develop a flare of hemorrhoids in their lifetime.  When bulging hemorrhoidal veins are irritated, they can swell, burn, itch, cause pain, and bleed.  Most flares will calm down gradually own within a few weeks.  However, once hemorrhoids are created, they are difficult to get rid of completely and tend to flare more easily than the first flare.   Fortunately, good habits and simple medical treatment usually control hemorrhoids well, and surgery is needed only in severe cases. Types of Hemorrhoids:  Internal hemorrhoids usually don't initially hurt or itch; they are deep inside the rectum and usually have no sensation. If they begin to push out (prolapse), pain and burning can occur.  However, internal hemorrhoids can bleed.  Anal bleeding should not be ignored since bleeding could come from a dangerous source like colorectal cancer, so persistent rectal bleeding should be investigated by a doctor, sometimes with a colonoscopy.  External hemorrhoids cause most of the symptoms - pain, burning, and itching. Nonirritated hemorrhoids can look like small skin tags coming out of the anus.   Thrombosed hemorrhoids can form when a hemorrhoid blood vessel bursts and causes the hemorrhoid to suddenly swell.  A purple blood clot can form in it and become an excruciatingly painful lump at the anus. Because of these unpleasant symptoms, immediate incision and  drainage by a surgeon at an office visit can provide much relief of the pain.    PREVENTION Avoiding the most frequent causes listed below will prevent most cases of hemorrhoids: Constipation Hard stools Diarrhea  Constant sitting  Straining with bowel movements Sitting on the toilet for a long time  Severe coughing  episodes Pregnancy / Childbirth  Heavy Lifting  Sometimes avoiding the above triggers is difficult:  How can you avoid sitting all day if you have a seated job? Also, we try to avoid coughing and diarrhea, but sometimes it's beyond your control.  Still, there are some practical hints to help: Keep the anal and genital area clean.  Moistened tissues such as flushable wet wipes are less irritating than toilet paper.  Using irrigating showers or bottle irrigation washing gently cleans this sensitive area.   Avoid dry toilet paper when cleaning after bowel movements.  Marland Kitchen Keep the anal and genital area dry.  Lightly pat the rectal area dry.  Avoid rubbing.  Talcum or baby powders can help GET YOUR STOOLS SOFT.   This is the most important way to prevent irritated hemorrhoids.  Hard stools are like sandpaper to the anorectal canal and will cause more problems.  The goal: ONE SOFT BOWEL MOVEMENT A DAY!  BMs from every other day to 3 times a day is a tolerable range Treat coughing, diarrhea and constipation early since irritated hemorrhoids may soon follow.  If your main job activity is seated, always stand or walk during your breaks. Make it a point to stand and walk at least 5 minutes every hour  and try to shift frequently in your chair to avoid direct rectal pressure.  Always exhale as you strain or lift. Don't hold your breath.  Do not delay or try to prevent a bowel movement when the urge is present. Exercise regularly (walking or jogging 60 minutes a day) to stimulate the bowels to move. No reading or other activity while on the toilet. If bowel movements take longer than 5 minutes,  you are too constipated. AVOID CONSTIPATION Drink plenty of liquids (1 1/2 to 2 quarts of water and other fluids a day unless fluid restricted for another medical condition). Liquids that contain caffeine (coffee a, tea, soft drinks) can be dehydrating and should be avoided until constipation is controlled. Consider minimizing milk, as dairy products may be constipating. Eat plenty of fiber (30g a day ideal, more if needed).  Fiber is the undigested part of plant food that passes into the colon, acting as "natures broom" to encourage bowel motility and movement.  Fiber can absorb and hold large amounts of water. This results in a larger, bulkier stool, which is soft and easier to pass.  Eating foods high in fiber - 12 servings - such as  Vegetables: Root (potatoes, carrots, turnips), Leafy green (lettuce, salad greens, celery, spinach), High residue (cabbage, broccoli, etc.) Fruit: Fresh, Dried (prunes, apricots, cherries), Stewed (applesauce)  Whole grain breads, pasta, whole wheat Bran cereals, muffins, etc. Consider adding supplemental bulking fiber which retains large volumes of water: Psyllium ground seeds --available as Metamucil, Konsyl, Effersyllium, Per Diem Fiber, or the less expensive generic forms.  Citrucel  (methylcellulose wood fiber) . FiberCon (Polycarbophil) Polyethylene Glycol - and "artificial" fiber commonly called Miralax or Glycolax.  It is helpful for people with gassy or bloated feelings with regular fiber Flax Seed - a less gassy natural fiber  Laxatives can be useful for a short period if constipation is severe Osmotics (Milk of Magnesia, Fleets Phospho-Soda, Magnesium Citrate)  Stimulants (Senokot,   Castor Oil,  Dulcolax, Ex-Lax)    Laxatives are not a good long-term solution as it can stress the bowels and cause too much mineral loss and dehydration.   Avoid taking laxatives for more than 7 days in a row.  AVOID DIARRHEA Switch to liquids and simpler foods for a few  days to avoid stressing your intestines further. Avoid dairy products (especially milk & ice cream) for a short time.  The intestines often can lose the ability to digest lactose when stressed. Avoid foods that cause gassiness or bloating.  Typical foods include beans and other legumes, cabbage, broccoli, and dairy foods.  Every person has some sensitivity to other foods, so listen to your body and avoid those foods that trigger problems for you. Adding fiber (Citrucel, Metamucil, FiberCon, Flax seed, Miralax) gradually can help thicken stools by absorbing excess fluid and retrain the intestines to act more normally.  Slowly increase the dose over a few weeks.  Too much fiber too soon can backfire and cause cramping & bloating. Probiotics (such as active yogurt, Align, etc) may help repopulate the intestines and colon with normal bacteria and calm down a sensitive digestive tract.  Most studies show it to be of mild help, though, and such products can be costly. Medicines: Bismuth subsalicylate (ex. Kayopectate, Pepto Bismol) every 30 minutes for up to 6 doses can help control diarrhea.  Avoid if pregnant. Loperamide (Immodium) can slow down diarrhea.  Start with two tablets (48m total) first and then try one tablet every 6 hours.  Avoid if you are having fevers or severe pain.  If you are not better or start feeling worse, stop all medicines and call your doctor for advice Call your doctor if you are getting worse or not better.  Sometimes further testing (cultures, endoscopy, X-ray studies, bloodwork, etc) may be needed to help diagnose and treat the cause of the diarrhea. TREATMENT OF HEMORRHOID FLARE If these preventive measures fail, you must take action right away! Hemorrhoids are one condition that can be mild in the morning and become intolerable by nightfall. Most hemorrhoidal flares take several weeks to calm down.  These suggestions can help: Warm soaks.  This helps more than any topical  medication.  Use up to 8 times a day.  Usually sitz baths or sitting in a warm bathtub helps.  Sitting on moist warm towels are helpful.  Switching to ice packs/cool compresses can be helpful Normalize your bowels.  Extremes of diarrhea or constipation will make hemorrhoids worse.  One soft bowel movement a day is the goal.  Fiber can help get your bowels regular Wet wipes instead of toilet paper Pain control with a NSAID such as ibuprofen (Advil) or naproxen (Aleve) or acetaminophen (Tylenol) around the clock.  Narcotics are constipating and should be minimized if possible Topical creams contain steroids (bydrocortisone) or local anesthetic (xylocaine) can help make pain and itching more tolerable.   EVALUATION If hemorrhoids are still causing problems, you could benefit by an evaluation by a surgeon.  The surgeon will obtain a history and examine you.  If hemorrhoids are diagnosed, some therapies can be offered in the office, usually with an anoscope into the less sensitive area of the rectum: -injection of hemorrhoids (sclerotherapy) can scar the blood vessels of the swollen/enlarged hemorrhoids to help shrink them down to a more normal size -rubber banding of the enlarged hemorrhoids to help shrink them down to a more normal size -drainage of the blood clot causing a thrombosed hemorrhoid,  to relieve the severe pain   While 90% of the time such problems from hemorrhoids can be managed without preceding to surgery, sometimes the hemorrhoids require a operation to control the problem (uncontrolled bleeding, prolapse, pain, etc.).   This involves being placed under general anesthesia where the surgeon can confirm the diagnosis and remove, suture, or staple the hemorrhoid(s).  Your surgeon can help you treat the problem appropriately.

## 2012-12-29 NOTE — Progress Notes (Signed)
Subjective:     Patient ID: Kristine [name], female   DOB: 05-24-1981, 32 y.o.   MRN: [id number]  HPI   Kristine [name]  23-Jan-1981 [id number]  Patient Care Team: Robley Fries, MD as Consulting Physician (Obstetrics and Gynecology)  This patient is a 32 y.o.female who presents today for surgical evaluation at the request of Dr. Juliene Pina.   Reason for visit: Painful hemorrhoid.  Pleasant woman originally from [place].  Going into her third trimester.  Struggles with chronic constipation, especially since she moved to the [place].  Has a bowel movement once a week.  She developed a painful thrombosed hemorrhoid.  Dr. Juliene Pina sent the patient to [place] for surgical evaluation. I did incision and drainage of the thrombosed hemorrhoid earlier this month.  Since that was done, she is "98% better."  However, she does note blood with bowel movements.  It concerns her.  Some mild sensitivity but not severe.  She is taking Metamucil once a day.  Some nausea with meals but better on an empty stomach.  Still feels constipated but moving bowels more regularly.  No fevers or chills.  No drainage.    Patient Active Problem List   Diagnosis Date Noted  . Hemorrhoids, external, thrombosed s/p excision 12/12/2012 12/12/2012  . Constipation, chronic 12/12/2012    Past Medical History  Diagnosis Date  . Medical history non-contributory   . Constipation, chronic 12/12/2012    Past Surgical History  Procedure Laterality Date  . No past surgeries      History   Social History  . Marital Status: Married    Spouse Name: N/A    Number of Children: N/A  . Years of Education: N/A   Occupational History  . Not on file.   Social History Main Topics  . Smoking status: Never Smoker   . Smokeless tobacco: Never Used  . Alcohol Use: No  . Drug Use: No  . Sexually Active: Yes    Birth Control/ Protection: None   Other Topics Concern  . Not on file   Social History Narrative  . No  narrative on file    Family History  Problem Relation Age of Onset  . Other Neg Hx   . Hearing loss Neg Hx   . Alcohol abuse Neg Hx   . Arthritis Neg Hx   . Asthma Neg Hx   . Birth defects Neg Hx   . Cancer Neg Hx   . COPD Neg Hx   . Depression Neg Hx   . Diabetes Neg Hx   . Drug abuse Neg Hx   . Early death Neg Hx   . Heart disease Neg Hx   . Hyperlipidemia Neg Hx   . Hypertension Neg Hx   . Kidney disease Neg Hx   . Learning disabilities Neg Hx   . Mental illness Neg Hx   . Mental retardation Neg Hx   . Miscarriages / Stillbirths Neg Hx   . Stroke Neg Hx   . Vision loss Neg Hx     Current Outpatient Prescriptions  Medication Sig Dispense Refill  . Docusate Calcium (STOOL SOFTENER PO) Take by mouth.      . IRON PO Take 1 tablet by mouth daily.      . Prenatal Vit-Fe Fumarate-FA (PRENATAL MULTIVITAMIN) TABS Take 1 tablet by mouth at bedtime.      Marland Kitchen oxyCODONE (OXY IR/ROXICODONE) 5 MG immediate release tablet Take 1-2 tablets (5-10 mg total) by mouth every 6 (six) hours  as needed for pain.  30 tablet  0   No current facility-administered medications for this visit.     No Known Allergies  BP 98/68  Pulse 70  Resp 18  Ht 5\' 3"  (1.6 m)  Wt 150 lb (68.04 kg)  BMI 26.58 kg/m2  LMP 05/28/2012  No results found.   Review of Systems  Constitutional: Negative for fever, chills and diaphoresis.  HENT: Negative for ear pain, sore throat and trouble swallowing.   Eyes: Negative for photophobia and visual disturbance.  Respiratory: Negative for cough and choking.   Cardiovascular: Negative for chest pain and palpitations.  Gastrointestinal: Positive for anal bleeding. Negative for nausea, vomiting, abdominal pain, diarrhea, constipation, abdominal distention and rectal pain.  Genitourinary: Negative for dysuria, frequency and difficulty urinating.  Musculoskeletal: Negative for myalgias and gait problem.  Skin: Negative for color change, pallor and rash.  Neurological:  Negative for dizziness, speech difficulty, weakness and numbness.  Hematological: Negative for adenopathy.  Psychiatric/Behavioral: Negative for confusion and agitation. The patient is not nervous/anxious.        Objective:   Physical Exam  Constitutional: She is oriented to person, place, and time. She appears well-developed and well-nourished. No distress.  HENT:  Head: Normocephalic.  Mouth/Throat: Oropharynx is clear and moist. No oropharyngeal exudate.  Eyes: Conjunctivae and EOM are normal. Pupils are equal, round, and reactive to light. No scleral icterus.  Neck: Normal range of motion. No tracheal deviation present.  Cardiovascular: Normal rate and intact distal pulses.   Pulmonary/Chest: Effort normal. No respiratory distress. She exhibits no tenderness.  Abdominal: Soft. She exhibits no distension. There is no tenderness. Hernia confirmed negative in the right inguinal area and confirmed negative in the left inguinal area.     Genitourinary: No vaginal discharge found.  Exam done with assistance of female Medical Assistant in the room.  Perianal skin clean with good hygiene.  No pruritis.  No pilonidal disease.  No fissure.  No abscess/fistula.  No external wound  Tolerated DRE.  Anterior midline narrow wound at hemorrhoidectomy site in anal canal - much improved.  No skin tags / hemorrhoids of significance.  No prolapse   Musculoskeletal: Normal range of motion. She exhibits no tenderness.  Lymphadenopathy:       Right: No inguinal adenopathy present.       Left: No inguinal adenopathy present.  Neurological: She is alert and oriented to person, place, and time. No cranial nerve deficit. She exhibits normal muscle tone. Coordination normal.  Skin: Skin is warm and dry. No rash noted. She is not diaphoretic.  Psychiatric: She has a normal mood and affect. Her behavior is normal.       Assessment:     Painful thrombosed external hemorrhoid 2 weeks s/p I&D    Plan:      I reassured her that the area is much improved.  Mild bleeding until the raw wound closes up and is normal.  That should improve over the next month.  Increase fiber to twice a day so that her bowels are more soft and more regular.  She feels reassured.  I did discuss hemorrhoid management/prevention with her as well:  The anatomy & physiology of the anorectal region was discussed.  The pathophysiology of hemorrhoids and differential diagnosis was discussed.  Natural history progression  was discussed.   I stressed the importance of a bowel regimen to have daily soft bowel movements to minimize progression of disease.   Goal of one BM /  day ideal.  Use of wet wipes, warm baths, avoiding straining, etc were emphasized.  Educational handouts further explaining the pathology, treatment options, and bowel regimen were given as well.   The patient expressed understanding.

## 2013-02-19 ENCOUNTER — Encounter (HOSPITAL_COMMUNITY): Payer: Self-pay | Admitting: *Deleted

## 2013-02-19 ENCOUNTER — Inpatient Hospital Stay (HOSPITAL_COMMUNITY)
Admission: AD | Admit: 2013-02-19 | Discharge: 2013-02-22 | DRG: 765 | Disposition: A | Discharge status: Home / Self Care | Source: Ambulatory Visit | Attending: Obstetrics & Gynecology | Admitting: Obstetrics & Gynecology

## 2013-02-19 DIAGNOSIS — Z22330 Carrier of Group B streptococcus: Secondary | ICD-10-CM

## 2013-02-19 DIAGNOSIS — K649 Unspecified hemorrhoids: Secondary | ICD-10-CM | POA: Diagnosis present

## 2013-02-19 DIAGNOSIS — O9902 Anemia complicating childbirth: Secondary | ICD-10-CM | POA: Diagnosis present

## 2013-02-19 DIAGNOSIS — D509 Iron deficiency anemia, unspecified: Secondary | ICD-10-CM | POA: Diagnosis present

## 2013-02-19 DIAGNOSIS — O99892 Other specified diseases and conditions complicating childbirth: Secondary | ICD-10-CM | POA: Diagnosis present

## 2013-02-19 DIAGNOSIS — O368190 Decreased fetal movements, unspecified trimester, not applicable or unspecified: Secondary | ICD-10-CM | POA: Diagnosis present

## 2013-02-19 DIAGNOSIS — O878 Other venous complications in the puerperium: Secondary | ICD-10-CM | POA: Diagnosis present

## 2013-02-19 HISTORY — DX: Iron deficiency anemia, unspecified: D50.9

## 2013-02-19 HISTORY — DX: Acute posthemorrhagic anemia: D62

## 2013-02-19 NOTE — MAU Note (Signed)
Pt presents with complaint of contractions.  

## 2013-02-20 ENCOUNTER — Encounter (HOSPITAL_COMMUNITY)
Admission: AD | Disposition: A | Discharge status: Home / Self Care | Payer: Self-pay | Source: Ambulatory Visit | Attending: Obstetrics & Gynecology

## 2013-02-20 ENCOUNTER — Encounter (HOSPITAL_COMMUNITY): Payer: Self-pay | Admitting: Anesthesiology

## 2013-02-20 ENCOUNTER — Inpatient Hospital Stay (HOSPITAL_COMMUNITY): Admitting: Anesthesiology

## 2013-02-20 ENCOUNTER — Encounter (HOSPITAL_COMMUNITY): Payer: Self-pay | Admitting: *Deleted

## 2013-02-20 LAB — CBC
HCT: 32.4 % — ABNORMAL LOW (ref 36.0–46.0)
MCH: 30.6 pg (ref 26.0–34.0)
MCV: 90 fL (ref 78.0–100.0)
RBC: 3.6 MIL/uL — ABNORMAL LOW (ref 3.87–5.11)
WBC: 13.2 10*3/uL — ABNORMAL HIGH (ref 4.0–10.5)

## 2013-02-20 LAB — ABO/RH: ABO/RH(D): O POS

## 2013-02-20 SURGERY — Surgical Case
Anesthesia: Epidural | Wound class: Clean Contaminated

## 2013-02-20 MED ORDER — SIMETHICONE 80 MG PO CHEW
80.0000 mg | CHEWABLE_TABLET | Freq: Three times a day (TID) | ORAL | Status: DC
  Administered 2013-02-20 – 2013-02-22 (×6): 80 mg via ORAL

## 2013-02-20 MED ORDER — IBUPROFEN 600 MG PO TABS
600.0000 mg | ORAL_TABLET | Freq: Four times a day (QID) | ORAL | Status: DC
  Administered 2013-02-20 – 2013-02-22 (×7): 600 mg via ORAL
  Filled 2013-02-20 (×6): qty 1

## 2013-02-20 MED ORDER — MORPHINE SULFATE (PF) 0.5 MG/ML IJ SOLN
INTRAMUSCULAR | Status: DC | PRN
  Administered 2013-02-20: 1 mg via INTRAVENOUS

## 2013-02-20 MED ORDER — ONDANSETRON HCL 4 MG/2ML IJ SOLN: 4.0000 mg | Freq: Four times a day (QID) | INTRAMUSCULAR | Status: DC | PRN

## 2013-02-20 MED ORDER — DIPHENHYDRAMINE HCL 25 MG PO CAPS
25.0000 mg | ORAL_CAPSULE | Freq: Four times a day (QID) | ORAL | Status: DC | PRN
  Filled 2013-02-20: qty 1

## 2013-02-20 MED ORDER — EPHEDRINE 5 MG/ML INJ
10.0000 mg | INTRAVENOUS | Status: DC | PRN
  Filled 2013-02-20: qty 4

## 2013-02-20 MED ORDER — FLEET ENEMA 7-19 GM/118ML RE ENEM: 1.0000 | ENEMA | Freq: Once | RECTAL | Status: DC

## 2013-02-20 MED ORDER — KETOROLAC TROMETHAMINE 30 MG/ML IJ SOLN: 30.0000 mg | Freq: Four times a day (QID) | INTRAMUSCULAR | Status: AC | PRN

## 2013-02-20 MED ORDER — MEPERIDINE HCL 25 MG/ML IJ SOLN: 6.2500 mg | INTRAMUSCULAR | Status: DC | PRN

## 2013-02-20 MED ORDER — NALBUPHINE SYRINGE 5 MG/0.5 ML
5.0000 mg | INJECTION | INTRAMUSCULAR | Status: DC | PRN
  Filled 2013-02-20: qty 1

## 2013-02-20 MED ORDER — SODIUM BICARBONATE 8.4 % IV SOLN
INTRAVENOUS | Status: AC
  Filled 2013-02-20: qty 50

## 2013-02-20 MED ORDER — KETOROLAC TROMETHAMINE 60 MG/2ML IM SOLN
INTRAMUSCULAR | Status: AC
  Administered 2013-02-20: 60 mg via INTRAMUSCULAR
  Filled 2013-02-20: qty 2

## 2013-02-20 MED ORDER — ONDANSETRON HCL 4 MG/2ML IJ SOLN
4.0000 mg | Freq: Three times a day (TID) | INTRAMUSCULAR | Status: DC | PRN
Start: 2013-02-20 — End: 2013-02-22

## 2013-02-20 MED ORDER — PHENYLEPHRINE 40 MCG/ML (10ML) SYRINGE FOR IV PUSH (FOR BLOOD PRESSURE SUPPORT): 80.0000 ug | PREFILLED_SYRINGE | INTRAVENOUS | Status: DC | PRN

## 2013-02-20 MED ORDER — SIMETHICONE 80 MG PO CHEW: 80.0000 mg | CHEWABLE_TABLET | ORAL | Status: DC | PRN

## 2013-02-20 MED ORDER — PRENATAL MULTIVITAMIN CH
1.0000 | ORAL_TABLET | Freq: Every day | ORAL | Status: DC
  Administered 2013-02-21 – 2013-02-22 (×2): 1 via ORAL
  Filled 2013-02-20 (×4): qty 1

## 2013-02-20 MED ORDER — TETANUS-DIPHTH-ACELL PERTUSSIS 5-2.5-18.5 LF-MCG/0.5 IM SUSP
0.5000 mL | Freq: Once | INTRAMUSCULAR | Status: DC
  Filled 2013-02-20: qty 0.5

## 2013-02-20 MED ORDER — CEFAZOLIN SODIUM-DEXTROSE 2-3 GM-% IV SOLR
INTRAVENOUS | Status: AC
  Filled 2013-02-20: qty 50

## 2013-02-20 MED ORDER — NALOXONE HCL 1 MG/ML IJ SOLN
1.0000 ug/kg/h | INTRAVENOUS | Status: DC | PRN
  Filled 2013-02-20: qty 2

## 2013-02-20 MED ORDER — LACTATED RINGERS IV SOLN
INTRAVENOUS | Status: DC
  Administered 2013-02-20: 23:00:00 via INTRAVENOUS

## 2013-02-20 MED ORDER — MENTHOL 3 MG MT LOZG
1.0000 | LOZENGE | OROMUCOSAL | Status: DC | PRN
  Administered 2013-02-20: 3 mg via ORAL
  Filled 2013-02-20 (×2): qty 9

## 2013-02-20 MED ORDER — NALOXONE HCL 0.4 MG/ML IJ SOLN: 0.4000 mg | INTRAMUSCULAR | Status: DC | PRN

## 2013-02-20 MED ORDER — ONDANSETRON HCL 4 MG/2ML IJ SOLN
INTRAMUSCULAR | Status: DC | PRN
  Administered 2013-02-20: 4 mg via INTRAVENOUS

## 2013-02-20 MED ORDER — TERBUTALINE SULFATE 1 MG/ML IJ SOLN: 0.2500 mg | Freq: Once | INTRAMUSCULAR | Status: AC

## 2013-02-20 MED ORDER — ONDANSETRON HCL 4 MG PO TABS: 4.0000 mg | ORAL_TABLET | ORAL | Status: DC | PRN

## 2013-02-20 MED ORDER — DIPHENHYDRAMINE HCL 25 MG PO CAPS
25.0000 mg | ORAL_CAPSULE | ORAL | Status: DC | PRN
  Filled 2013-02-20: qty 1

## 2013-02-20 MED ORDER — SCOPOLAMINE 1 MG/3DAYS TD PT72
MEDICATED_PATCH | TRANSDERMAL | Status: AC
  Administered 2013-02-20: 1.5 mg via TRANSDERMAL
  Filled 2013-02-20: qty 1

## 2013-02-20 MED ORDER — IBUPROFEN 600 MG PO TABS: 600.0000 mg | ORAL_TABLET | Freq: Four times a day (QID) | ORAL | Status: DC | PRN

## 2013-02-20 MED ORDER — OXYTOCIN BOLUS FROM INFUSION
500.0000 mL | INTRAVENOUS | Status: DC
Start: 2013-02-20 — End: 2013-02-20

## 2013-02-20 MED ORDER — LACTATED RINGERS IV SOLN
INTRAVENOUS | Status: DC
  Administered 2013-02-20: 07:00:00 via INTRAUTERINE

## 2013-02-20 MED ORDER — METOCLOPRAMIDE HCL 5 MG/ML IJ SOLN
10.0000 mg | Freq: Three times a day (TID) | INTRAMUSCULAR | Status: DC | PRN
  Administered 2013-02-20: 10 mg via INTRAVENOUS
  Filled 2013-02-20: qty 2

## 2013-02-20 MED ORDER — OXYTOCIN 10 UNIT/ML IJ SOLN
40.0000 [IU] | INTRAVENOUS | Status: DC | PRN
  Administered 2013-02-20: 40 [IU] via INTRAVENOUS

## 2013-02-20 MED ORDER — DIBUCAINE 1 % RE OINT
1.0000 "application " | TOPICAL_OINTMENT | RECTAL | Status: DC | PRN
  Filled 2013-02-20: qty 28

## 2013-02-20 MED ORDER — LACTATED RINGERS IV SOLN
INTRAVENOUS | Status: DC | PRN
  Administered 2013-02-20: 11:00:00 via INTRAVENOUS

## 2013-02-20 MED ORDER — NALBUPHINE SYRINGE 5 MG/0.5 ML
10.0000 mg | INJECTION | Freq: Once | INTRAMUSCULAR | Status: AC
  Administered 2013-02-20: 10 mg via INTRAVENOUS
  Filled 2013-02-20: qty 1

## 2013-02-20 MED ORDER — NALBUPHINE HCL 10 MG/ML IJ SOLN: 10.0000 mg | INTRAMUSCULAR | Status: DC | PRN

## 2013-02-20 MED ORDER — EPHEDRINE 5 MG/ML INJ: 10.0000 mg | INTRAVENOUS | Status: DC | PRN

## 2013-02-20 MED ORDER — SODIUM BICARBONATE 8.4 % IV SOLN
INTRAVENOUS | Status: DC | PRN
  Administered 2013-02-20: 5 mL via EPIDURAL

## 2013-02-20 MED ORDER — LACTATED RINGERS IV SOLN
500.0000 mL | Freq: Once | INTRAVENOUS | Status: AC
  Administered 2013-02-20: 500 mL via INTRAVENOUS

## 2013-02-20 MED ORDER — MORPHINE SULFATE (PF) 0.5 MG/ML IJ SOLN
INTRAMUSCULAR | Status: DC | PRN
  Administered 2013-02-20: 4 mg via EPIDURAL

## 2013-02-20 MED ORDER — LACTATED RINGERS IV SOLN
500.0000 mL | INTRAVENOUS | Status: DC | PRN
  Administered 2013-02-20 (×2): 500 mL via INTRAVENOUS

## 2013-02-20 MED ORDER — DIPHENHYDRAMINE HCL 50 MG/ML IJ SOLN: 12.5000 mg | INTRAMUSCULAR | Status: DC | PRN

## 2013-02-20 MED ORDER — FENTANYL 2.5 MCG/ML BUPIVACAINE 1/10 % EPIDURAL INFUSION (WH - ANES)
14.0000 mL/h | INTRAMUSCULAR | Status: DC | PRN
  Administered 2013-02-20: 14 mL/h via EPIDURAL
  Filled 2013-02-20: qty 125

## 2013-02-20 MED ORDER — LIDOCAINE HCL (PF) 1 % IJ SOLN: 30.0000 mL | INTRAMUSCULAR | Status: DC | PRN

## 2013-02-20 MED ORDER — OXYCODONE-ACETAMINOPHEN 5-325 MG PO TABS: 1.0000 | ORAL_TABLET | ORAL | Status: DC | PRN

## 2013-02-20 MED ORDER — CITRIC ACID-SODIUM CITRATE 334-500 MG/5ML PO SOLN
30.0000 mL | ORAL | Status: DC | PRN
  Administered 2013-02-20: 30 mL via ORAL
  Filled 2013-02-20: qty 15

## 2013-02-20 MED ORDER — PHENYLEPHRINE 40 MCG/ML (10ML) SYRINGE FOR IV PUSH (FOR BLOOD PRESSURE SUPPORT)
80.0000 ug | PREFILLED_SYRINGE | INTRAVENOUS | Status: DC | PRN
  Filled 2013-02-20: qty 5

## 2013-02-20 MED ORDER — TERBUTALINE SULFATE 1 MG/ML IJ SOLN
INTRAMUSCULAR | Status: AC
  Administered 2013-02-20: 0.25 mg via SUBCUTANEOUS
  Filled 2013-02-20: qty 1

## 2013-02-20 MED ORDER — ONDANSETRON HCL 4 MG/2ML IJ SOLN: 4.0000 mg | INTRAMUSCULAR | Status: DC | PRN

## 2013-02-20 MED ORDER — LACTATED RINGERS IV SOLN
INTRAVENOUS | Status: DC
  Administered 2013-02-20 (×2): via INTRAVENOUS

## 2013-02-20 MED ORDER — SODIUM CHLORIDE 0.9 % IJ SOLN: 3.0000 mL | INTRAMUSCULAR | Status: DC | PRN

## 2013-02-20 MED ORDER — SCOPOLAMINE 1 MG/3DAYS TD PT72: 1.0000 | MEDICATED_PATCH | Freq: Once | TRANSDERMAL | Status: DC

## 2013-02-20 MED ORDER — MORPHINE SULFATE 0.5 MG/ML IJ SOLN
INTRAMUSCULAR | Status: AC
  Filled 2013-02-20: qty 10

## 2013-02-20 MED ORDER — 0.9 % SODIUM CHLORIDE (POUR BTL) OPTIME
TOPICAL | Status: DC | PRN
  Administered 2013-02-20: 1000 mL

## 2013-02-20 MED ORDER — ACETAMINOPHEN 325 MG PO TABS: 650.0000 mg | ORAL_TABLET | ORAL | Status: DC | PRN

## 2013-02-20 MED ORDER — FENTANYL CITRATE 0.05 MG/ML IJ SOLN: 25.0000 ug | INTRAMUSCULAR | Status: DC | PRN

## 2013-02-20 MED ORDER — OXYTOCIN 40 UNITS IN LACTATED RINGERS INFUSION - SIMPLE MED: 62.5000 mL/h | INTRAVENOUS | Status: AC

## 2013-02-20 MED ORDER — PENICILLIN G POTASSIUM 5000000 UNITS IJ SOLR
5.0000 10*6.[IU] | Freq: Once | INTRAVENOUS | Status: AC
  Administered 2013-02-20: 5 10*6.[IU] via INTRAVENOUS
  Filled 2013-02-20: qty 5

## 2013-02-20 MED ORDER — ZOLPIDEM TARTRATE 5 MG PO TABS: 5.0000 mg | ORAL_TABLET | Freq: Every evening | ORAL | Status: DC | PRN

## 2013-02-20 MED ORDER — PENICILLIN G POTASSIUM 5000000 UNITS IJ SOLR
2.5000 10*6.[IU] | INTRAVENOUS | Status: DC
  Administered 2013-02-20: 2.5 10*6.[IU] via INTRAVENOUS
  Filled 2013-02-20 (×4): qty 2.5

## 2013-02-20 MED ORDER — NALBUPHINE SYRINGE 5 MG/0.5 ML
5.0000 mg | INJECTION | INTRAMUSCULAR | Status: DC | PRN
Start: 2013-02-20 — End: 2013-02-22
  Filled 2013-02-20: qty 1

## 2013-02-20 MED ORDER — OXYTOCIN 10 UNIT/ML IJ SOLN
INTRAMUSCULAR | Status: AC
  Filled 2013-02-20: qty 4

## 2013-02-20 MED ORDER — OXYTOCIN 40 UNITS IN LACTATED RINGERS INFUSION - SIMPLE MED: 62.5000 mL/h | INTRAVENOUS | Status: DC

## 2013-02-20 MED ORDER — DIPHENHYDRAMINE HCL 50 MG/ML IJ SOLN: 25.0000 mg | INTRAMUSCULAR | Status: DC | PRN

## 2013-02-20 MED ORDER — ONDANSETRON HCL 4 MG/2ML IJ SOLN
INTRAMUSCULAR | Status: AC
  Filled 2013-02-20: qty 2

## 2013-02-20 MED ORDER — WITCH HAZEL-GLYCERIN EX PADS: 1.0000 "application " | MEDICATED_PAD | CUTANEOUS | Status: DC | PRN

## 2013-02-20 MED ORDER — SENNOSIDES-DOCUSATE SODIUM 8.6-50 MG PO TABS
2.0000 | ORAL_TABLET | Freq: Every day | ORAL | Status: DC
  Administered 2013-02-20 – 2013-02-21 (×2): 2 via ORAL

## 2013-02-20 MED ORDER — KETOROLAC TROMETHAMINE 60 MG/2ML IM SOLN: 60.0000 mg | Freq: Once | INTRAMUSCULAR | Status: AC | PRN

## 2013-02-20 MED ORDER — LANOLIN HYDROUS EX OINT: 1.0000 "application " | TOPICAL_OINTMENT | CUTANEOUS | Status: DC | PRN

## 2013-02-20 MED ORDER — LIDOCAINE-EPINEPHRINE (PF) 2 %-1:200000 IJ SOLN
INTRAMUSCULAR | Status: AC
  Filled 2013-02-20: qty 20

## 2013-02-20 MED ORDER — LIDOCAINE HCL (PF) 1 % IJ SOLN
INTRAMUSCULAR | Status: DC | PRN
  Administered 2013-02-20 (×2): 5 mL

## 2013-02-20 MED ORDER — CEFAZOLIN SODIUM-DEXTROSE 2-3 GM-% IV SOLR
2.0000 g | INTRAVENOUS | Status: AC
  Administered 2013-02-20: 2 g via INTRAVENOUS
  Filled 2013-02-20: qty 50

## 2013-02-20 SURGICAL SUPPLY — 39 items
BENZOIN TINCTURE PRP APPL 2/3 (GAUZE/BANDAGES/DRESSINGS) IMPLANT
CLAMP CORD UMBIL (MISCELLANEOUS) IMPLANT
CLOTH BEACON ORANGE TIMEOUT ST (SAFETY) ×2 IMPLANT
CONTAINER PREFILL 10% NBF 15ML (MISCELLANEOUS) IMPLANT
DRAPE LG THREE QUARTER DISP (DRAPES) ×2 IMPLANT
DRSG OPSITE POSTOP 4X10 (GAUZE/BANDAGES/DRESSINGS) ×2 IMPLANT
DURAPREP 26ML APPLICATOR (WOUND CARE) ×2 IMPLANT
ELECT REM PT RETURN 9FT ADLT (ELECTROSURGICAL) ×2
ELECTRODE REM PT RTRN 9FT ADLT (ELECTROSURGICAL) ×1 IMPLANT
EXTRACTOR VACUUM KIWI (MISCELLANEOUS) IMPLANT
EXTRACTOR VACUUM M CUP 4 TUBE (SUCTIONS) IMPLANT
GLOVE BIO SURGEON STRL SZ 6.5 (GLOVE) ×2 IMPLANT
GLOVE BIO SURGEON STRL SZ7 (GLOVE) ×2 IMPLANT
GLOVE BIOGEL PI IND STRL 7.0 (GLOVE) ×3 IMPLANT
GLOVE BIOGEL PI INDICATOR 7.0 (GLOVE) ×3
GLOVE SURG SS PI 6.5 STRL IVOR (GLOVE) ×2 IMPLANT
GOWN STRL REIN XL XLG (GOWN DISPOSABLE) ×6 IMPLANT
KIT ABG SYR 3ML LUER SLIP (SYRINGE) ×2 IMPLANT
NEEDLE HYPO 25X5/8 SAFETYGLIDE (NEEDLE) ×2 IMPLANT
NS IRRIG 1000ML POUR BTL (IV SOLUTION) ×2 IMPLANT
PACK C SECTION WH (CUSTOM PROCEDURE TRAY) ×2 IMPLANT
PAD OB MATERNITY 4.3X12.25 (PERSONAL CARE ITEMS) ×2 IMPLANT
RTRCTR C-SECT PINK 25CM LRG (MISCELLANEOUS) IMPLANT
STAPLER VISISTAT 35W (STAPLE) IMPLANT
STRIP CLOSURE SKIN 1/4X4 (GAUZE/BANDAGES/DRESSINGS) IMPLANT
SUT MNCRL 0 VIOLET CTX 36 (SUTURE) ×3 IMPLANT
SUT MONOCRYL 0 CTX 36 (SUTURE) ×3
SUT PLAIN 0 NONE (SUTURE) IMPLANT
SUT PLAIN 2 0 (SUTURE)
SUT PLAIN ABS 2-0 CT1 27XMFL (SUTURE) IMPLANT
SUT VIC AB 0 CT1 27 (SUTURE) ×2
SUT VIC AB 0 CT1 27XBRD ANBCTR (SUTURE) ×2 IMPLANT
SUT VIC AB 2-0 CT1 27 (SUTURE) ×2
SUT VIC AB 2-0 CT1 TAPERPNT 27 (SUTURE) ×2 IMPLANT
SUT VIC AB 4-0 KS 27 (SUTURE) IMPLANT
SUT VICRYL 0 TIES 12 18 (SUTURE) IMPLANT
TOWEL OR 17X24 6PK STRL BLUE (TOWEL DISPOSABLE) ×6 IMPLANT
TRAY FOLEY CATH 14FR (SET/KITS/TRAYS/PACK) IMPLANT
WATER STERILE IRR 1000ML POUR (IV SOLUTION) ×2 IMPLANT

## 2013-02-20 NOTE — Anesthesia Preprocedure Evaluation (Addendum)
Anesthesia Evaluation  Patient identified by MRN, date of birth, ID band Patient awake    Reviewed: Allergy & Precautions, H&P , Patient's Chart, lab work & pertinent test results  Airway Mallampati: II TM Distance: >3 FB Neck ROM: full    Dental no notable dental hx.    Pulmonary neg pulmonary ROS,  breath sounds clear to auscultation  Pulmonary exam normal       Cardiovascular negative cardio ROS  Rhythm:regular Rate:Normal     Neuro/Psych negative neurological ROS  negative psych ROS   GI/Hepatic negative GI ROS, Neg liver ROS,   Endo/Other  negative endocrine ROS  Renal/GU negative Renal ROS     Musculoskeletal   Abdominal   Peds  Hematology negative hematology ROS (+)   Anesthesia Other Findings   Reproductive/Obstetrics (+) Pregnancy (fetal intolerance to labor --> c/s)                          Anesthesia Physical Anesthesia Plan  ASA: II and emergent  Anesthesia Plan: Epidural   Post-op Pain Management:    Induction:   Airway Management Planned:   Additional Equipment:   Intra-op Plan:   Post-operative Plan:   Informed Consent: I have reviewed the patients History and Physical, chart, labs and discussed the procedure including the risks, benefits and alternatives for the proposed anesthesia with the patient or authorized representative who has indicated his/her understanding and acceptance.     Plan Discussed with: Surgeon and CRNA  Anesthesia Plan Comments:        Anesthesia Quick Evaluation

## 2013-02-20 NOTE — Progress Notes (Signed)
Dr mody updated on fhr with 9 minute prolonged decel now with variables, currently attending surgery asks RN to prepare pt for C/section and is coming tosee pt

## 2013-02-20 NOTE — Progress Notes (Signed)
Kristine [name] is a 32 y.o. G1P0 at [redacted]w[redacted]d dates c/w 9 wk sono in MAU. Admitted for observation due to prolonged deceleration noted in MAU when presented for labor check last night.  Overnight there have been 3 episodes of deceleration down to 60s and lasting 3 minutes, after left lateral, O2 and hydration.  S/p Nubain for contraction pain and was able to rest.  BP 107/52  Pulse 90  Temp(Src) 97.6 F (36.4 C) (Oral)  Resp 20  Ht 5\' 3"  (1.6 m)  Wt 155 lb (70.308 kg)  BMI 27.46 kg/m2  SpO2 100%  LMP 05/28/2012   FHT:   FHR: 120 bpm, variability: moderate,  accelerations:  Present,  decelerations:  Present 3 prolonged decel down to 60 lasting . UC:   irregular, every 4-6 minutes SVE:   Dilation: 2.5 Effacement (%): 70 Station: -2 Exam by:: Dr Juliene Pina  VTX AROM done, very minimal AF noted, was moderate meconium stained. IUPC placed for Amnioinfusion  Assessment / Plan: Admission for recurrent severe prolonged decelerations with early labor. GBS+, on PCN per protocol. AROM for labor AOL, will start pitocin if UCs not regular over next hour and assess for fetal tolerance of labor. Lateral position, Amnioinfusion, O2 mask.  Overall category II at times but I in b/w decels, and ok to continue labor.  Epidural ok if UCs regular and stronger.  EFW 7 lbs  Kristine [name] R 02/20/2013, 6:59 AM

## 2013-02-20 NOTE — Transfer of Care (Signed)
Immediate Anesthesia Transfer of Care Note  Patient: Kristine [name]  Procedure(s) Performed: Procedure(s): CESAREAN SECTION (N/A)  Patient Location: PACU  Anesthesia Type:Epidural  Level of Consciousness: awake  Airway & Oxygen Therapy: Patient Spontanous Breathing  Post-op Assessment: Report given to PACU RN and Post -op Vital signs reviewed and stable  Post vital signs: stable  Complications: No apparent anesthesia complications

## 2013-02-20 NOTE — Op Note (Signed)
Low Transverse Cesarean Section Procedure Note   Kristine [name] 02/20/13  Indications: Fetal Distress, recurrent prolonged decelerations, remote from delivery.   Pre-operative Diagnosis: fetal intolerance to labor, recurrent prolonged decelerations.   Post-operative Diagnosis: Same   Surgeon: Robley Fries, MD   Assistants: Alphonsus Sias. Ernestina Penna, MD   Anesthesia: epidural   Procedure Details:  The patient was seen in the Labor Room. Patient was admitted overnight in early labor due to fetal deceleration noted in MAU when she came for labor check. Overnight there were several prolonged decels lasting 3-5 minutes. Then we noted a 9 minute decel that recovered followed by recurrent variable decels with contractions. Terbutaline injection resolved contractions and decels, but considering she was 3 cm dilated, remote from delivery, c-section delivery was advised and she agreed. The risks, benefits, complications, treatment options, and expected outcomes were discussed with the patient. The patient concurred with the proposed plan, giving informed consent. identified as Kristine [name] and the procedure verified as C-Section Delivery. A Time Out was held in the OR and the above information confirmed. 2 gm Ancef given. After induction of Epidural anesthesia, the patient was draped and prepped in the usual sterile manner, indwelling foley was draining clear urine.  A Pfannenstiel Incision was made and carried down through the subcutaneous tissue to the fascia. Fascial incision was made and extended transversely. The fascia was separated from the underlying rectus tissue superiorly and inferiorly. The peritoneum was identified and entered. Peritoneal incision was extended longitudinally. The utero-vesical peritoneal reflection was incised transversely. A low transverse uterine incision was made. Delivered from cephalic OT presentation was a BOY with Apgar scores of 8 at one minute and 9 at five minutes.  Nuchal and body cord noted. Cord ph was sent the umbilical cord was clamped and cut cord blood was obtained for evaluation. The placenta was removed Intact and appeared normal. The uterine outline, tubes and ovaries appeared normal. The uterine incision was closed with running locked sutures of 0 Monocryl followed by a second imbricating layer.  Hemostasis was observed. Peritoneum closed with 2-0 Vicryl. Muscles approximated in midline.  The fascia was then reapproximated with running sutures of 0Vicryl. The subcuticular closure was performed using 4-0Vicryl. Sterile dressing applied.  Instrument, sponge, and needle counts were correct prior the abdominal closure and were correct at the conclusion of the case.   Findings: Female infant delivered Cephalic from OT position without difficulty from pelvic inlet, nuchal cord x 1 and body cord noted. Apgars 8 and 9 at 1 and 5 min. Cord pH normal. Normal placenta, thin cord.    Estimated Blood Loss: 800 cc  Total IV Fluids: LR  Urine Output: 200 CC OF clear urine  Specimens: cord gas and cord blood  Complications: no complications  Disposition: PACU - hemodynamically stable.   Maternal Condition: stable   Baby condition / location:  nursery-stable  Attending Attestation: I performed the procedure.   Signed: Surgeon(s): Robley Fries, MD Alphonsus Sias Ernestina Penna, MD

## 2013-02-20 NOTE — Anesthesia Procedure Notes (Signed)
Epidural Patient location during procedure: OB Start time: 02/20/2013 8:09 AM  Staffing Anesthesiologist: Angus Seller., Harrell Gave. Performed by: anesthesiologist   Preanesthetic Checklist Completed: patient identified, site marked, surgical consent, pre-op evaluation, timeout performed, IV checked, risks and benefits discussed and monitors and equipment checked  Epidural Patient position: sitting Prep: site prepped and draped and DuraPrep Patient monitoring: continuous pulse ox and blood pressure Approach: midline Injection technique: LOR air and LOR saline  Needle:  Needle type: Tuohy  Needle gauge: 17 G Needle length: 9 cm and 9 Needle insertion depth: 5 cm cm Catheter type: closed end flexible Catheter size: 19 Gauge Catheter at skin depth: 10 cm Test dose: negative  Assessment Events: blood not aspirated, injection not painful, no injection resistance, negative IV test and no paresthesia  Additional Notes Patient identified.  Risk benefits discussed including failed block, incomplete pain control, headache, nerve damage, paralysis, blood pressure changes, nausea, vomiting, reactions to medication both toxic or allergic, and postpartum back pain.  Patient expressed understanding and wished to proceed.  All questions were answered.  Sterile technique used throughout procedure and epidural site dressed with sterile barrier dressing. No paresthesia or other complications noted.The patient did not experience any signs of intravascular injection such as tinnitus or metallic taste in mouth nor signs of intrathecal spread such as rapid motor block. Please see nursing notes for vital signs.

## 2013-02-20 NOTE — Anesthesia Postprocedure Evaluation (Signed)
  Anesthesia Post Note  Patient: Kristine [name]  Procedure(s) Performed: Procedure(s) (LRB): CESAREAN SECTION (N/A)  Anesthesia type: Epidural  Patient location: PACU  Post pain: Pain level controlled  Post assessment: Post-op Vital signs reviewed  Last Vitals:  Filed Vitals:   02/20/13 1215  BP: 96/55  Pulse: 94  Temp:   Resp: 22    Post vital signs: Reviewed  Level of consciousness: awake  Complications: No apparent anesthesia complications

## 2013-02-20 NOTE — Progress Notes (Signed)
Patient ID: Kristine [name], female   DOB: 04/22/1981, 32 y.o.   MRN: [id number] Pt admitted over night for decel seen in MAU and also c/o decreased FMs later. Overnight occasional decels continued that were 3-5 min long with good recover and back to category I. Then a 9 min prolonged decel noted after epidural ( later) that recovered and then [facility] noted variable decels with UCs. Gave terbutaline and recovered FHT to cagetory I.  Will proceed with c/section since remote from delivery and fetal intolerance to labor.   Hilary Hertz, MD

## 2013-02-20 NOTE — H&P (Addendum)
Kristine [name] is a 32 y.o. female G1 at 38.4 wks, presented to MAU for labor check, having UCs all day and progressively stronger since 7 pm. No leaking fluid and no bleeding. Good FMs.  In MAU she was under observation when 3 min decel in FHT noted down to 60 and returned to baseline in 3 min and hence admission was advised. At this point she is being admitted for admission and observation, extended FHT and possible labor AOL.   [facility], Dr Juliene Pina. Started care at 15 wks. Several visits for back pain, contractions, decreased FMs with reassuring testing in office.  Labs nl (no screening). Anatomy sono nl, failed 1 hr Glucola, passed 3 hr GTT. Hemorrhoids in preg, s/p Gen surg consult and I&D, Interval growth sono for S<D noted normal fetal growth, AC at 10%, low nl AFI at 36 wks.   Maternal Medical History:  Reason for admission: Contractions.     OB History   Grav Para Term Preterm Abortions TAB SAB Ect Mult Living   1              Past Medical History  Diagnosis Date  . Medical history non-contributory   . Constipation, chronic 12/12/2012   Past Surgical History  Procedure Laterality Date  . No past surgeries     Family History: family history is negative for Other, and Hearing loss, and Alcohol abuse, and Arthritis, and Asthma, and Birth defects, and Cancer, and COPD, and Depression, and Diabetes, and Drug abuse, and Early death, and Heart disease, and Hyperlipidemia, and Hypertension, and Kidney disease, and Learning disabilities, and Mental illness, and Mental retardation, and Miscarriages / Stillbirths, and Stroke, and Vision loss, . Social History:  reports that she has never smoked. She has never used smokeless tobacco. She reports that she does not drink alcohol or use illicit drugs.   Prenatal Transfer Tool  Maternal Diabetes: No Genetic Screening: Declined  Maternal Ultrasounds/Referrals: Normal Fetal Ultrasounds or other Referrals:  None Maternal Substance  Abuse:  No Significant Maternal Medications:  None Significant Maternal Lab Results:  Lab values include: Group B Strep positive Other Comments:  Consaguous marriage (husband is 1st cousin) Anatomy sono normal, Female  ROS  Neg HA/vision changes/RUQ pain, fever, bleeding.    Blood pressure 101/57, pulse 82, temperature 97.7 F (36.5 C), temperature source Oral, resp. rate 18, height 5\' 3"  (1.6 m), weight 156 lb (70.761 kg), last menstrual period 05/28/2012, SpO2 100.00%. Exam Physical Exam  A&O x 3, no acute distress. Pleasant HEENT neg, no thyromegaly Lungs CTA bilat CV RRR, S1S2 normal Abdo soft, non tender, non acute Extr no edema/ tenderness Pelvic 1-2/50%/-3/VTX. FHT  130s/ + accels and mod variability, one late decel noted at she was placed on the monitor and recent severe decel to 60s for total 3 min noted.  Toco irritability  Assessment/Plan: 32 yo G1 at 38.4 wks, admitted for observation due to fetal deceleration down to 60s and 3 min to recover, and possible labor augmentation. GBS prophylaxis w PCN per protocol.    Kristine [name] R 02/20/2013, 12:01 AM

## 2013-02-21 ENCOUNTER — Encounter (HOSPITAL_COMMUNITY): Payer: Self-pay | Admitting: Obstetrics and Gynecology

## 2013-02-21 DIAGNOSIS — D509 Iron deficiency anemia, unspecified: Secondary | ICD-10-CM

## 2013-02-21 DIAGNOSIS — D62 Acute posthemorrhagic anemia: Secondary | ICD-10-CM

## 2013-02-21 HISTORY — DX: Iron deficiency anemia, unspecified: D50.9

## 2013-02-21 HISTORY — DX: Acute posthemorrhagic anemia: D62

## 2013-02-21 LAB — CBC
HCT: 24.8 % — ABNORMAL LOW (ref 36.0–46.0)
Hemoglobin: 8.5 g/dL — ABNORMAL LOW (ref 12.0–15.0)
MCH: 31.4 pg (ref 26.0–34.0)
MCHC: 34.3 g/dL (ref 30.0–36.0)
RBC: 2.71 MIL/uL — ABNORMAL LOW (ref 3.87–5.11)

## 2013-02-21 MED ORDER — POLYSACCHARIDE IRON COMPLEX 150 MG PO CAPS
150.0000 mg | ORAL_CAPSULE | Freq: Two times a day (BID) | ORAL | Status: DC
  Administered 2013-02-21 – 2013-02-22 (×3): 150 mg via ORAL
  Filled 2013-02-21 (×5): qty 1

## 2013-02-21 NOTE — Progress Notes (Signed)
Patient ID: Kristine [name], female   DOB: 08-04-1981, 32 y.o.   MRN: [id number] Subjective: POD# 1 Information for the patient's newborn:  Tyyne, Cliett [[id number]]  female  / circ yes  Reports feeling well Feeding: breast Patient reports tolerating PO.  Breast symptoms: none Pain controlled with ibuprofen (OTC) Denies HA/SOB/C/P/N/V/dizziness. Flatus none. She reports vaginal bleeding as normal, without clots.  [name], urinating without difficult.     Objective:   VS:  Filed Vitals:   02/20/13 2016 02/20/13 2215 02/21/13 0215 02/21/13 0626  BP: 92/58 97/58 86/48  84/46  Pulse: 80 88 81 81  Temp:  99.1 F (37.3 C) 98.7 F (37.1 C) 98.1 F (36.7 C)  TempSrc:  Oral Oral Oral  Resp:  18 18 18   Height:      Weight:      SpO2:  94% 97% 96%        Recent Labs  02/20/13 0005 02/21/13 0600  WBC 13.2* 14.2*  HGB 11.0* 8.5*  HCT 32.4* 24.8*  PLT 260 209     Blood type: --/--/O POS (07/11 0005)  Rubella: Immune (01/30 0000)     Physical Exam:  General: alert, cooperative and no distress CV: Regular rate and rhythm Resp: clear Abdomen: soft, nontender, normal bowel sounds Incision: Tegaderm and Honeycomb dressing in place - no apparent drainage see through tegaderm Uterine Fundus: firm, 1 FB below umbilicus, nontender Lochia: minimal Ext: extremities normal, atraumatic, no cyanosis or edema, Homans sign is negative, no sign of DVT and no edema, redness or tenderness in the calves or thighs      Assessment/Plan: 32 y.o.   POD# 1.  s/p Cesarean Delivery.  Indications: fetal distress                Principal Problem:   Postpartum care following cesarean delivery (7/11) Active Problems:   Anemia, iron deficiency  Doing well, stable.               Advance diet as tolerated Ambulate Routine post-op care  Kenard Gower, MSN, CNM 02/21/2013, 11:38 AM

## 2013-02-21 NOTE — Anesthesia Postprocedure Evaluation (Signed)
Anesthesia Post Note  Patient: Kristine [name]  Procedure(s) Performed: Procedure(s) (LRB): CESAREAN SECTION (N/A)  Anesthesia type: Epidural  Patient location: Mother/Baby  Post pain: Pain level controlled  Post assessment: Post-op Vital signs reviewed  Last Vitals:  Filed Vitals:   02/21/13 0626  BP: 84/46  Pulse: 81  Temp: 36.7 C  Resp: 18    Post vital signs: Reviewed  Level of consciousness: awake  Complications: No apparent anesthesia complications

## 2013-02-22 MED ORDER — POLYSACCHARIDE IRON COMPLEX 150 MG PO CAPS: 150.0000 mg | ORAL_CAPSULE | Freq: Two times a day (BID) | ORAL | Status: DC

## 2013-02-22 MED ORDER — OXYCODONE-ACETAMINOPHEN 5-325 MG PO TABS: 1.0000 | ORAL_TABLET | Freq: Four times a day (QID) | ORAL | Status: DC | PRN

## 2013-02-22 MED ORDER — IBUPROFEN 600 MG PO TABS: 600.0000 mg | ORAL_TABLET | Freq: Four times a day (QID) | ORAL | Status: DC

## 2013-02-22 NOTE — Progress Notes (Signed)
Patient ID: Kristine Kristine [name] Kristine [name], female   DOB: 16-Jan-1981, 32 y.o.   MRN: [id number] Subjective: POD# 2 Information for the patient's newborn:  Kristine Kristine [name], Kristine [name] [[id number]]  female  / circ yes  Reports feeling well - ready for d/c Feeding: breast Patient reports tolerating PO.  Breast symptoms: none Pain controlled with ibuprofen (OTC) and narcotic analgesics including Percocet Denies HA/SOB/C/P/N/V/dizziness. Flatus (+). She reports vaginal bleeding as normal, without clots.  [name], urinating without difficult.     Objective:   VS:  Filed Vitals:   02/21/13 0626 02/21/13 1045 02/21/13 1915 02/22/13 0619  BP: 84/46 95/60 94/55  86/54  Pulse: 81 84 98 79  Temp: 98.1 F (36.7 C) 98.3 F (36.8 C) 98.1 F (36.7 C) 98.1 F (36.7 C)  TempSrc: Oral Oral Oral Oral  Resp: 18 18 18 19   Height:      Weight:      SpO2: 96% 97%        Recent Labs  02/20/13 0005 02/21/13 0600  WBC 13.2* 14.2*  HGB 11.0* 8.5*  HCT 32.4* 24.8*  PLT 260 209     Blood type: --/--/O POS (07/11 0005)  Rubella: Immune (01/30 0000)     Physical Exam:  General: alert, cooperative and no distress CV: Regular rate and rhythm Resp: clear Abdomen: soft, nontender, normal bowel sounds Incision: Tegaderm and Honeycomb in place Uterine Fundus: firm, 2FB below umbilicus, nontender Lochia: minimal Ext: extremities normal, atraumatic, no cyanosis or edema and Homans sign is negative, no sign of DVT      Assessment/Plan: 32 y.o.   POD# 2.  s/p Cesarean Delivery.  Indications: fetal distress                Principal Problem:   Postpartum care following cesarean delivery (7/11) Active Problems:   Anemia, iron deficiency  Doing well, stable.               Advance diet as tolerated Continue routine post-op care Remove Tegaderm and Honeycomb dressing on Day #8 postpartum  Discharge home today  [name], M, MSN, CNM 02/22/2013, 1:22 PM

## 2013-02-22 NOTE — Discharge Summary (Signed)
Obstetric Discharge Summary Reason for Admission: induction of labor Prenatal Procedures: none Intrapartum Procedures: cesarean: low cervical, transverse Postpartum Procedures: none Complications-Operative and Postpartum: none Hemoglobin  Date Value Range Status  02/21/2013 8.5* 12.0 - 15.0 g/dL Final     REPEATED TO VERIFY     DELTA CHECK NOTED     HCT  Date Value Range Status  02/21/2013 24.8* 36.0 - 46.0 % Final     REPEATED TO VERIFY    Physical Exam:  General: alert, cooperative and no distress Lochia: appropriate Uterine Fundus: firm, midline, U-2 Incision: Tegaderm and Honeycomb in place DVT Evaluation: No evidence of DVT seen on physical exam. Negative Homan's sign. No cords or calf tenderness. No significant calf/ankle edema.  Discharge Diagnoses: Failed induction and Iron Deficiency Anemia  Discharge Information: Date: 02/22/2013 Activity: pelvic rest Diet: routine Medications: PNV, Ibuprofen and Percocet Condition: stable Instructions: refer to practice specific booklet Discharge to: home Follow-up Information   Follow up with MODY,VAISHALI R, MD. Schedule an appointment as soon as possible for a visit in 6 weeks.   Contact information:   [address] [place] [address] [phone]       Newborn Data: Live born female  Birth Weight: 5 lb 14 oz (2665 g) APGAR: 9, 9  Home with mother.  Kenard Gower, MSN, CNM 02/22/2013, 1:32 PM

## 2013-02-23 ENCOUNTER — Encounter (HOSPITAL_COMMUNITY): Payer: Self-pay | Admitting: Obstetrics & Gynecology

## 2013-02-23 NOTE — Discharge Summary (Signed)
Reviewed and agree with note and plan. V.Wilbur Oakland, MD  

## 2013-03-12 ENCOUNTER — Encounter (HOSPITAL_COMMUNITY): Payer: Self-pay | Admitting: *Deleted

## 2013-05-08 ENCOUNTER — Other Ambulatory Visit: Payer: Self-pay | Admitting: Obstetrics & Gynecology

## 2013-05-08 DIAGNOSIS — N6320 Unspecified lump in the left breast, unspecified quadrant: Secondary | ICD-10-CM

## 2013-05-11 ENCOUNTER — Other Ambulatory Visit: Payer: Self-pay | Admitting: Obstetrics & Gynecology

## 2013-05-11 ENCOUNTER — Ambulatory Visit
Admission: RE | Admit: 2013-05-11 | Discharge: 2013-05-11 | Disposition: A | Discharge status: Home / Self Care | Source: Ambulatory Visit | Attending: Obstetrics & Gynecology | Admitting: Obstetrics & Gynecology

## 2013-05-11 DIAGNOSIS — N6320 Unspecified lump in the left breast, unspecified quadrant: Secondary | ICD-10-CM

## 2013-08-10 ENCOUNTER — Ambulatory Visit
Admission: RE | Admit: 2013-08-10 | Discharge: 2013-08-10 | Disposition: A | Discharge status: Home / Self Care | Source: Ambulatory Visit | Attending: Obstetrics & Gynecology | Admitting: Obstetrics & Gynecology

## 2013-08-10 DIAGNOSIS — N6320 Unspecified lump in the left breast, unspecified quadrant: Secondary | ICD-10-CM

## 2014-06-14 ENCOUNTER — Encounter (HOSPITAL_COMMUNITY): Payer: Self-pay | Admitting: *Deleted

## 2015-09-06 ENCOUNTER — Emergency Department (INDEPENDENT_AMBULATORY_CARE_PROVIDER_SITE_OTHER)
Admission: EM | Admit: 2015-09-06 | Discharge: 2015-09-06 | Disposition: A | Discharge status: Home / Self Care | Source: Home / Self Care | Attending: Emergency Medicine | Admitting: Emergency Medicine

## 2015-09-06 ENCOUNTER — Encounter (HOSPITAL_COMMUNITY): Payer: Self-pay | Admitting: Emergency Medicine

## 2015-09-06 DIAGNOSIS — J00 Acute nasopharyngitis [common cold]: Secondary | ICD-10-CM

## 2015-09-06 DIAGNOSIS — R51 Headache: Secondary | ICD-10-CM

## 2015-09-06 DIAGNOSIS — R519 Headache, unspecified: Secondary | ICD-10-CM

## 2015-09-06 MED ORDER — KETOROLAC TROMETHAMINE 60 MG/2ML IM SOLN
INTRAMUSCULAR | Status: AC
  Filled 2015-09-06: qty 2

## 2015-09-06 MED ORDER — AMOXICILLIN 500 MG PO CAPS: 500.0000 mg | ORAL_CAPSULE | Freq: Three times a day (TID) | ORAL | Status: DC

## 2015-09-06 MED ORDER — KETOROLAC TROMETHAMINE 60 MG/2ML IM SOLN
60.0000 mg | Freq: Once | INTRAMUSCULAR | Status: AC
  Administered 2015-09-06: 60 mg via INTRAMUSCULAR

## 2015-09-06 NOTE — Discharge Instructions (Signed)
Cough, Adult °A cough helps to clear your throat and lungs. A cough may last only 2-3 weeks (acute), or it may last longer than 8 weeks (chronic). Many different things can cause a cough. A cough may be a sign of an illness or another medical condition. °HOME CARE °· Pay attention to any changes in your cough. °· Take medicines only as told by your doctor. °· If you were prescribed an antibiotic medicine, take it as told by your doctor. Do not stop taking it even if you start to feel better. °· Talk with your doctor before you try using a cough medicine. °· Drink enough fluid to keep your pee (urine) clear or pale yellow. °· If the air is dry, use a cold steam vaporizer or humidifier in your home. °· Stay away from things that make you cough at work or at home. °· If your cough is worse at night, try using extra pillows to raise your head up higher while you sleep. °· Do not smoke, and try not to be around smoke. If you need help quitting, ask your doctor. °· Do not have caffeine. °· Do not drink alcohol. °· Rest as needed. °GET HELP IF: °· You have new problems (symptoms). °· You cough up yellow fluid (pus). °· Your cough does not get better after 2-3 weeks, or your cough gets worse. °· Medicine does not help your cough and you are not sleeping well. °· You have pain that gets worse or pain that is not helped with medicine. °· You have a fever. °· You are losing weight and you do not know why. °· You have night sweats. °GET HELP RIGHT AWAY IF: °· You cough up blood. °· You have trouble breathing. °· Your heartbeat is very fast. °  °This information is not intended to replace advice given to you by your health care provider. Make sure you discuss any questions you have with your health care provider. °  °Document Released: 04/12/2011 Document Revised: 04/20/2015 Document Reviewed: 10/06/2014 °Elsevier Interactive Patient Education ©2016 [employer] ° °Upper Respiratory Infection, Adult °Most upper respiratory  infections (URIs) are caused by a virus. A URI affects the nose, throat, and upper air passages. The most common type of URI is often called "the common cold." °HOME CARE  °· Take medicines only as told by your doctor. °· Gargle warm saltwater or take cough drops to comfort your throat as told by your doctor. °· Use a warm mist humidifier or inhale steam from a shower to increase air moisture. This may make it easier to breathe. °· Drink enough fluid to keep your pee (urine) clear or pale yellow. °· Eat soups and other clear broths. °· Have a healthy diet. °· Rest as needed. °· Go back to work when your fever is gone or your doctor says it is okay. °· You may need to stay home longer to avoid giving your URI to others. °· You can also wear a face mask and wash your hands often to prevent spread of the virus. °· Use your inhaler more if you have asthma. °· Do not use any tobacco products, including cigarettes, chewing tobacco, or electronic cigarettes. If you need help quitting, ask your doctor. °GET HELP IF: °· You are getting worse, not better. °· Your symptoms are not helped by medicine. °· You have chills. °· You are getting more short of breath. °· You have brown or red mucus. °· You have yellow or brown discharge from   your nose. °· You have pain in your face, especially when you bend forward. °· You have a fever. °· You have puffy (swollen) neck glands. °· You have pain while swallowing. °· You have white areas in the back of your throat. °GET HELP RIGHT AWAY IF:  °· You have very bad or constant: °· Headache. °· Ear pain. °· Pain in your forehead, behind your eyes, and over your cheekbones (sinus pain). °· Chest pain. °· You have long-lasting (chronic) lung disease and any of the following: °· Wheezing. °· Long-lasting cough. °· Coughing up blood. °· A change in your usual mucus. °· You have a stiff neck. °· You have changes in your: °· Vision. °· Hearing. °· Thinking. °· Mood. °MAKE SURE YOU:  °· Understand  these instructions. °· Will watch your condition. °· Will get help right away if you are not doing well or get worse. °  °This information is not intended to replace advice given to you by your health care provider. Make sure you discuss any questions you have with your health care provider. °  °Document Released: 01/16/2008 Document Revised: 12/14/2014 Document Reviewed: 11/04/2013 °Elsevier Interactive Patient Education ©2016 Elsevier Inc. ° ° °

## 2015-09-06 NOTE — ED Provider Notes (Signed)
CSN: [id number]     Arrival date & time 09/06/15  1716 History   First MD Initiated Contact with Patient 09/06/15 1921     Chief Complaint  Patient presents with  . URI   (Consider location/radiation/quality/duration/timing/severity/associated sxs/prior Treatment) HPI Headache cough cold symptoms for the last several days. No over-the-counter medications are helping. Past Medical History  Diagnosis Date  . Medical history non-contributory   . Constipation, chronic 12/12/2012  . Postpartum care following cesarean delivery (7/11) 02/21/2013  . Acute blood loss anemia 02/21/2013  . Anemia, iron deficiency 02/21/2013   Past Surgical History  Procedure Laterality Date  . No past surgeries    . Cesarean section N/A 02/20/2013    Procedure: CESAREAN SECTION;  Surgeon: Robley Fries, MD;  Location: [facility];  Service: Obstetrics;  Laterality: N/A;   Family History  Problem Relation Age of Onset  . Other Neg Hx   . Hearing loss Neg Hx   . Alcohol abuse Neg Hx   . Arthritis Neg Hx   . Asthma Neg Hx   . Birth defects Neg Hx   . Cancer Neg Hx   . COPD Neg Hx   . Depression Neg Hx   . Diabetes Neg Hx   . Drug abuse Neg Hx   . Early death Neg Hx   . Heart disease Neg Hx   . Hyperlipidemia Neg Hx   . Hypertension Neg Hx   . Kidney disease Neg Hx   . Learning disabilities Neg Hx   . Mental illness Neg Hx   . Mental retardation Neg Hx   . Miscarriages / Stillbirths Neg Hx   . Stroke Neg Hx   . Vision loss Neg Hx    Social History  Substance Use Topics  . Smoking status: Never Smoker   . Smokeless tobacco: Never Used  . Alcohol Use: No   OB History    Gravida Para Term Preterm AB TAB SAB Ectopic Multiple Living   Review of Systems ROS +'ve cough, Headache  Denies:  NAUSEA, ABDOMINAL PAIN, CHEST PAIN, CONGESTION, DYSURIA, SHORTNESS OF BREATH  Allergies  Review of patient's allergies indicates no known allergies.  Home Medications   Prior to Admission  medications   Medication Sig Start Date End Date Taking? Authorizing Provider  amoxicillin (AMOXIL) 500 MG capsule Take 1 capsule (500 mg total) by mouth 3 (three) times daily. 09/06/15   Tharon Aquas, PA  hydrocortisone cream 1 % Apply 1 application topically at bedtime. Applies to stomach.    Historical Provider, MD  ibuprofen (ADVIL,MOTRIN) 600 MG tablet Take 1 tablet (600 mg total) by mouth every 6 (six) hours. 02/22/13   [name], CNM  iron polysaccharides (NIFEREX) 150 MG capsule Take 1 capsule (150 mg total) by mouth 2 (two) times daily with a meal. 02/22/13   [name], CNM  oxyCODONE-acetaminophen (PERCOCET/ROXICET) 5-325 MG per tablet Take 1-2 tablets by mouth every 6 (six) hours as needed for pain. 02/22/13   [name], CNM  Prenatal Vit-Fe Fumarate-FA (PRENATAL MULTIVITAMIN) TABS Take 1 tablet by mouth at bedtime.    Historical Provider, MD   Meds Ordered and Administered this Visit   Medications  ketorolac (TORADOL) injection 60 mg (not administered)    BP 105/76 mmHg  Pulse 73  Temp(Src) 97.8 F (36.6 C) (Oral)  Resp 22  SpO2 99%  LMP 08/23/2015 No data found.   Physical  Exam  Constitutional: She is oriented to person, place, and time. She appears well-developed and well-nourished. No distress.  HENT:  Head: Normocephalic and atraumatic.  Right Ear: External ear normal.  Left Ear: External ear normal.  Mouth/Throat: Oropharynx is clear and moist.  Eyes: Conjunctivae and EOM are normal. Pupils are equal, round, and reactive to light.  Neck: Neck supple.  Cardiovascular: Normal rate.   Pulmonary/Chest: Effort normal and breath sounds normal.  Musculoskeletal: Normal range of motion.  Neurological: She is alert and oriented to person, place, and time.  Skin: Skin is warm and dry.  Psychiatric: She has a normal mood and affect. Her behavior is normal. Judgment and thought content normal.  Nursing note and vitals reviewed.   ED Course  Procedures  (including critical care time)  Labs Review Labs Reviewed - No data to display  Imaging Review No results found.   Visual Acuity Review  Right Eye Distance:   Left Eye Distance:   Bilateral Distance:    Right Eye Near:   Left Eye Near:    Bilateral Near:        Toradol given with good relief of symptoms. MDM   1. Nasopharyngitis acute   2. Acute nonintractable headache, unspecified headache type   Patient is advised to continue home symptomatic treatment. Prescription for amoxil and toradol sent pharmacy patient has indicated. Patient is advised that if there are new or worsening symptoms or attend the emergency department, or contact primary care provider. Instructions of care provided discharged home in stable condition.  THIS NOTE WAS GENERATED USING A VOICE RECOGNITION SOFTWARE PROGRAM. ALL REASONABLE EFFORTS  WERE MADE TO PROOFREAD THIS DOCUMENT FOR ACCURACY.     Tharon Aquas, PA 09/08/15 [phone]

## 2015-09-06 NOTE — ED Notes (Signed)
Pt d/c by Frank P, PA 

## 2015-09-06 NOTE — ED Notes (Signed)
C/o cold sx onset yest associated w/HA, BA, dry cough, CP due to cough, fever, emesis A&O x4... No acute distress.

## 2015-09-08 MED ORDER — KETOROLAC TROMETHAMINE 10 MG PO TABS: 10.0000 mg | ORAL_TABLET | Freq: Four times a day (QID) | ORAL | Status: DC | PRN

## 2016-05-04 LAB — OB RESULTS CONSOLE ABO/RH: RH TYPE: POSITIVE

## 2016-05-04 LAB — OB RESULTS CONSOLE ANTIBODY SCREEN: Antibody Screen: NEGATIVE

## 2016-05-04 LAB — OB RESULTS CONSOLE HIV ANTIBODY (ROUTINE TESTING): HIV: NONREACTIVE

## 2016-05-04 LAB — OB RESULTS CONSOLE GC/CHLAMYDIA
Chlamydia: NEGATIVE
Gonorrhea: NEGATIVE

## 2016-05-04 LAB — OB RESULTS CONSOLE HEPATITIS B SURFACE ANTIGEN: HEP B S AG: NEGATIVE

## 2016-05-04 LAB — OB RESULTS CONSOLE RUBELLA ANTIBODY, IGM: RUBELLA: IMMUNE

## 2016-05-04 LAB — OB RESULTS CONSOLE RPR: RPR: NONREACTIVE

## 2016-08-11 ENCOUNTER — Inpatient Hospital Stay (HOSPITAL_COMMUNITY)
Admission: AD | Admit: 2016-08-11 | Discharge: 2016-08-11 | Disposition: A | Discharge status: Home / Self Care | Source: Ambulatory Visit | Attending: Obstetrics & Gynecology | Admitting: Obstetrics & Gynecology

## 2016-08-11 ENCOUNTER — Encounter (HOSPITAL_COMMUNITY): Payer: Self-pay | Admitting: *Deleted

## 2016-08-11 DIAGNOSIS — O26892 Other specified pregnancy related conditions, second trimester: Secondary | ICD-10-CM | POA: Diagnosis present

## 2016-08-11 DIAGNOSIS — Z3A26 26 weeks gestation of pregnancy: Secondary | ICD-10-CM | POA: Diagnosis not present

## 2016-08-11 DIAGNOSIS — B379 Candidiasis, unspecified: Secondary | ICD-10-CM

## 2016-08-11 DIAGNOSIS — O98811 Other maternal infectious and parasitic diseases complicating pregnancy, first trimester: Secondary | ICD-10-CM | POA: Diagnosis not present

## 2016-08-11 DIAGNOSIS — Z3A24 24 weeks gestation of pregnancy: Secondary | ICD-10-CM | POA: Diagnosis not present

## 2016-08-11 DIAGNOSIS — R109 Unspecified abdominal pain: Secondary | ICD-10-CM | POA: Insufficient documentation

## 2016-08-11 DIAGNOSIS — B373 Candidiasis of vulva and vagina: Secondary | ICD-10-CM | POA: Diagnosis not present

## 2016-08-11 LAB — URINALYSIS, ROUTINE W REFLEX MICROSCOPIC
BILIRUBIN URINE: NEGATIVE
Glucose, UA: NEGATIVE mg/dL
Ketones, ur: NEGATIVE mg/dL
NITRITE: NEGATIVE
PH: 7 (ref 5.0–8.0)
Protein, ur: NEGATIVE mg/dL
SPECIFIC GRAVITY, URINE: 1.004 — AB (ref 1.005–1.030)

## 2016-08-11 LAB — WET PREP, GENITAL
CLUE CELLS WET PREP: NONE SEEN
Sperm: NONE SEEN
Trich, Wet Prep: NONE SEEN

## 2016-08-11 MED ORDER — TERCONAZOLE 0.4 % VA CREA: 1.0000 | TOPICAL_CREAM | Freq: Every day | VAGINAL | 0 refills | Status: DC

## 2016-08-11 NOTE — Discharge Instructions (Signed)

## 2016-08-11 NOTE — MAU Note (Addendum)
Have had some abd pain  off and on today. Had pink vag d/c in panties tonight. No pain now just occ tightness in abd. Yesterday leaked white, milky fld one time

## 2016-08-12 DIAGNOSIS — B373 Candidiasis of vulva and vagina: Secondary | ICD-10-CM | POA: Diagnosis not present

## 2016-08-12 DIAGNOSIS — O98811 Other maternal infectious and parasitic diseases complicating pregnancy, first trimester: Secondary | ICD-10-CM

## 2016-08-12 DIAGNOSIS — Z3A24 24 weeks gestation of pregnancy: Secondary | ICD-10-CM | POA: Diagnosis not present

## 2016-08-12 NOTE — MAU Provider Note (Signed)
Chief Complaint:  Vaginal Bleeding   None    HPI: Kristine [name] is a [age] y.o. G2P1001 at 5465w4d who presents to maternity admissions reporting pink discharge this evening and pain at work for 10-15 minutes. .  Location: lower abdomen Quality: cramping Severity: 6/10 in pain scale Duration: 10-15 min Context: unrelated to movement, intercourse, urination Timing: sudden onset Modifying factors:  Signs and symptoms: had white milky discharge yesterday and has had occasional vaginal itching   Denies contractions, leakage of fluid or vaginal bleeding. Good fetal movement.   Pregnancy Course:   Past Medical History: Past Medical History:  Diagnosis Date  . Acute blood loss anemia 02/21/2013  . Anemia, iron deficiency 02/21/2013  . Constipation, chronic 12/12/2012  . Medical history non-contributory   . Postpartum care following cesarean delivery (7/11) 02/21/2013    Past obstetric history: OB History  Gravida Para Term Preterm AB Living  2 1 1     1   SAB TAB Ectopic Multiple Live Births          1    # Outcome Date GA Lbr Len/2nd Weight Sex Delivery Anes PTL Lv  2 Current           1 Term 02/20/13 5146w5d  2.665 kg (5 lb 14 oz) M CS-LTranv EPI  LIV      Past Surgical History: Past Surgical History:  Procedure Laterality Date  . CESAREAN SECTION N/A 02/20/2013   Procedure: CESAREAN SECTION;  Surgeon: Robley FriesVaishali R Mody, MD;  Location: [facility];  Service: Obstetrics;  Laterality: N/A;  . NO PAST SURGERIES       Family History: Family History  Problem Relation Age of Onset  . Other Neg Hx   . Hearing loss Neg Hx   . Alcohol abuse Neg Hx   . Arthritis Neg Hx   . Asthma Neg Hx   . Birth defects Neg Hx   . Cancer Neg Hx   . COPD Neg Hx   . Depression Neg Hx   . Diabetes Neg Hx   . Drug abuse Neg Hx   . Early death Neg Hx   . Heart disease Neg Hx   . Hyperlipidemia Neg Hx   . Hypertension Neg Hx   . Kidney disease Neg Hx   . Learning disabilities Neg Hx   . Mental  illness Neg Hx   . Mental retardation Neg Hx   . Miscarriages / Stillbirths Neg Hx   . Stroke Neg Hx   . Vision loss Neg Hx     Social History: Social History  Substance Use Topics  . Smoking status: Never Smoker  . Smokeless tobacco: Never Used  . Alcohol use No    Allergies: No Known Allergies  Meds:  No prescriptions prior to admission.    I have reviewed patient's Past Medical Hx, Surgical Hx, Family Hx, Social Hx, medications and allergies.   ROS:  Review of Systems  Constitutional: Negative.   HENT: Negative.   Eyes: Negative.   Respiratory: Negative.   Cardiovascular: Negative.   Gastrointestinal: Positive for abdominal pain.  Endocrine: Negative.   Genitourinary: Positive for vaginal discharge.  Musculoskeletal: Negative.   Skin: Negative.   Allergic/Immunologic: Negative.   Neurological: Negative.   Hematological: Negative.   Psychiatric/Behavioral: Negative.     Physical Exam  No data found.  Constitutional: Well-developed, well-nourished female in no acute distress.  Cardiovascular: normal rate Respiratory: normal effort GI: Abd soft, non-tender, gravid appropriate for gestational age. Pos BS x  4 MS: Extremities nontender, no edema, normal ROM Neurologic: Alert and oriented x 4.  GU: Neg CVAT.  Pelvic: NEFG, adherent white discharge on the vaginal walls, no blood, cervix clean. No CVAT     FHT:  Baseline 140, moderate variability, accelerations present, no decelerations Contractions: none    Labs: Results for orders placed or performed during the hospital encounter of 08/11/16 (from the past 24 hour(s))  Urinalysis, Routine w reflex microscopic     Status: Abnormal   Collection Time: 08/11/16  9:05 PM  Result Value Ref Range   Color, Urine STRAW (A) YELLOW   APPearance CLEAR CLEAR   Specific Gravity, Urine 1.004 (L) 1.005 - 1.030   pH 7.0 5.0 - 8.0   Glucose, UA NEGATIVE NEGATIVE mg/dL   Hgb urine dipstick SMALL (A) NEGATIVE   Bilirubin  Urine NEGATIVE NEGATIVE   Ketones, ur NEGATIVE NEGATIVE mg/dL   Protein, ur NEGATIVE NEGATIVE mg/dL   Nitrite NEGATIVE NEGATIVE   Leukocytes, UA TRACE (A) NEGATIVE   RBC / HPF 0-5 0 - 5 RBC/hpf   WBC, UA 0-5 0 - 5 WBC/hpf   Bacteria, UA RARE (A) NONE SEEN   Squamous Epithelial / LPF 0-5 (A) NONE SEEN  Wet prep, genital     Status: Abnormal   Collection Time: 08/11/16 10:33 PM  Result Value Ref Range   Yeast Wet Prep HPF POC PRESENT (A) NONE SEEN   Trich, Wet Prep NONE SEEN NONE SEEN   Clue Cells Wet Prep HPF POC NONE SEEN NONE SEEN   WBC, Wet Prep HPF POC FEW (A) NONE SEEN   Sperm NONE SEEN     Imaging:  No results found.  MAU Course:  MDM:  Assessment: 1. Yeast infection involving the vagina and surrounding area      Plan: Discharge home in stable condition with RX for terazole.  Plan of care discussed with Dr. Seymour BarsLavoie, who agrees.  Allergies as of 08/12/2016   No Known Allergies     Medication List    Notice   Cannot display discharge medications because the patient has not yet been admitted.     Charlesetta GaribaldiKathryn Lorraine [place], CNM 08/12/2016 5:32 AM

## 2016-08-13 HISTORY — DX: Maternal care for unspecified type scar from previous cesarean delivery: O34.219

## 2016-08-13 LAB — CULTURE, OB URINE: Culture: 30000 — AB

## 2016-08-13 NOTE — Progress Notes (Signed)
Call from lab reporting Group Beta Strep test was positive in urine culture.

## 2016-08-14 LAB — GC/CHLAMYDIA PROBE AMP (~~LOC~~) NOT AT ARMC
CHLAMYDIA, DNA PROBE: NEGATIVE
Neisseria Gonorrhea: NEGATIVE

## 2016-08-30 LAB — OB RESULTS CONSOLE GBS: GBS: POSITIVE

## 2016-11-16 ENCOUNTER — Encounter (HOSPITAL_COMMUNITY): Payer: Self-pay

## 2016-11-16 ENCOUNTER — Telehealth (HOSPITAL_COMMUNITY): Payer: Self-pay | Admitting: *Deleted

## 2016-11-16 NOTE — Telephone Encounter (Signed)
Preadmission screen  

## 2016-11-19 ENCOUNTER — Other Ambulatory Visit: Payer: Self-pay | Admitting: Obstetrics & Gynecology

## 2016-11-21 ENCOUNTER — Encounter (HOSPITAL_COMMUNITY)
Admission: RE | Admit: 2016-11-21 | Discharge: 2016-11-21 | Disposition: A | Discharge status: Home / Self Care | Source: Ambulatory Visit | Attending: Obstetrics & Gynecology | Admitting: Obstetrics & Gynecology

## 2016-11-21 LAB — CBC WITH DIFFERENTIAL/PLATELET
BASOS PCT: 0 %
Basophils Absolute: 0 10*3/uL (ref 0.0–0.1)
Eosinophils Absolute: 0.1 10*3/uL (ref 0.0–0.7)
Eosinophils Relative: 0 %
HEMATOCRIT: 36.5 % (ref 36.0–46.0)
HEMOGLOBIN: 12.5 g/dL (ref 12.0–15.0)
LYMPHS ABS: 2.5 10*3/uL (ref 0.7–4.0)
Lymphocytes Relative: 22 %
MCH: 32.1 pg (ref 26.0–34.0)
MCHC: 34.2 g/dL (ref 30.0–36.0)
MCV: 93.8 fL (ref 78.0–100.0)
MONOS PCT: 4 %
Monocytes Absolute: 0.4 10*3/uL (ref 0.1–1.0)
NEUTROS ABS: 8.3 10*3/uL — AB (ref 1.7–7.7)
NEUTROS PCT: 74 %
Platelets: 255 10*3/uL (ref 150–400)
RBC: 3.89 MIL/uL (ref 3.87–5.11)
RDW: 14.3 % (ref 11.5–15.5)
WBC: 11.2 10*3/uL — ABNORMAL HIGH (ref 4.0–10.5)

## 2016-11-21 LAB — TYPE AND SCREEN
ABO/RH(D): O POS
ANTIBODY SCREEN: NEGATIVE

## 2016-11-21 NOTE — Patient Instructions (Signed)
20 Conna Terada  11/21/2016   Your procedure is scheduled on:  11/22/2016  Enter through the Main Entrance of [facility] at 1100 AM.  Pick up the phone at the desk and dial [phone].   Call this number if you have problems the morning of surgery: [phone]   Remember:   Do not eat food:After Midnight.  Do not drink clear liquids: After Midnight.  Take these medicines the morning of surgery with A SIP OF WATER: na   Do not wear jewelry, make-up or nail polish.  Do not wear lotions, powders, or perfumes. Do not wear deodorant.  Do not shave 48 hours prior to surgery.  Do not bring valuables to the hospital.  [facility] is not   responsible for any belongings or valuables brought to the hospital.  Contacts, dentures or bridgework may not be worn into surgery.  Leave suitcase in the car. After surgery it may be brought to your room.  For patients admitted to the hospital, checkout time is 11:00 AM the day of              discharge.   Patients discharged the day of surgery will not be allowed to drive             home.  Name and phone number of your driver: na  Special Instructions:   N/A   Please read over the following fact sheets that you were given:   Surgical Site Infection Prevention

## 2016-11-22 ENCOUNTER — Inpatient Hospital Stay (HOSPITAL_COMMUNITY)
Admission: RE | Admit: 2016-11-22 | Discharge: 2016-11-24 | DRG: 766 | Disposition: A | Discharge status: Home / Self Care | Source: Ambulatory Visit | Attending: Obstetrics & Gynecology | Admitting: Obstetrics & Gynecology

## 2016-11-22 ENCOUNTER — Inpatient Hospital Stay (HOSPITAL_COMMUNITY): Admitting: Anesthesiology

## 2016-11-22 ENCOUNTER — Encounter (HOSPITAL_COMMUNITY)
Admission: RE | Disposition: A | Discharge status: Home / Self Care | Payer: Self-pay | Source: Ambulatory Visit | Attending: Obstetrics & Gynecology

## 2016-11-22 ENCOUNTER — Encounter (HOSPITAL_COMMUNITY): Payer: Self-pay | Admitting: Obstetrics & Gynecology

## 2016-11-22 DIAGNOSIS — B373 Candidiasis of vulva and vagina: Secondary | ICD-10-CM

## 2016-11-22 DIAGNOSIS — Z8249 Family history of ischemic heart disease and other diseases of the circulatory system: Secondary | ICD-10-CM

## 2016-11-22 DIAGNOSIS — O99824 Streptococcus B carrier state complicating childbirth: Secondary | ICD-10-CM | POA: Diagnosis present

## 2016-11-22 DIAGNOSIS — O34211 Maternal care for low transverse scar from previous cesarean delivery: Secondary | ICD-10-CM | POA: Diagnosis present

## 2016-11-22 DIAGNOSIS — B3731 Acute candidiasis of vulva and vagina: Secondary | ICD-10-CM

## 2016-11-22 DIAGNOSIS — Z3A39 39 weeks gestation of pregnancy: Secondary | ICD-10-CM | POA: Diagnosis not present

## 2016-11-22 DIAGNOSIS — O34219 Maternal care for unspecified type scar from previous cesarean delivery: Secondary | ICD-10-CM | POA: Diagnosis present

## 2016-11-22 LAB — RPR: RPR: NONREACTIVE

## 2016-11-22 SURGERY — Surgical Case
Anesthesia: Spinal

## 2016-11-22 MED ORDER — SIMETHICONE 80 MG PO CHEW
80.0000 mg | CHEWABLE_TABLET | Freq: Three times a day (TID) | ORAL | Status: DC
  Administered 2016-11-23 – 2016-11-24 (×4): 80 mg via ORAL
  Filled 2016-11-22 (×4): qty 1

## 2016-11-22 MED ORDER — PHENYLEPHRINE 8 MG IN D5W 100 ML (0.08MG/ML) PREMIX OPTIME
INJECTION | INTRAVENOUS | Status: DC | PRN
  Administered 2016-11-22: 60 ug/min via INTRAVENOUS

## 2016-11-22 MED ORDER — FENTANYL CITRATE (PF) 100 MCG/2ML IJ SOLN
INTRAMUSCULAR | Status: DC | PRN
  Administered 2016-11-22: 10 ug via INTRATHECAL

## 2016-11-22 MED ORDER — MEPERIDINE HCL 25 MG/ML IJ SOLN: 6.2500 mg | INTRAMUSCULAR | Status: DC | PRN

## 2016-11-22 MED ORDER — MENTHOL 3 MG MT LOZG: 1.0000 | LOZENGE | OROMUCOSAL | Status: DC | PRN

## 2016-11-22 MED ORDER — OXYTOCIN 10 UNIT/ML IJ SOLN
INTRAMUSCULAR | Status: AC
  Filled 2016-11-22: qty 4

## 2016-11-22 MED ORDER — NALBUPHINE HCL 10 MG/ML IJ SOLN: 5.0000 mg | Freq: Once | INTRAMUSCULAR | Status: DC | PRN

## 2016-11-22 MED ORDER — DIPHENHYDRAMINE HCL 25 MG PO CAPS: 25.0000 mg | ORAL_CAPSULE | Freq: Four times a day (QID) | ORAL | Status: DC | PRN

## 2016-11-22 MED ORDER — NALBUPHINE HCL 10 MG/ML IJ SOLN: 5.0000 mg | INTRAMUSCULAR | Status: DC | PRN

## 2016-11-22 MED ORDER — MORPHINE SULFATE (PF) 4 MG/ML IV SOLN: 1.0000 mg | INTRAVENOUS | Status: DC | PRN

## 2016-11-22 MED ORDER — NALOXONE HCL 0.4 MG/ML IJ SOLN: 0.4000 mg | INTRAMUSCULAR | Status: DC | PRN

## 2016-11-22 MED ORDER — BUPIVACAINE IN DEXTROSE 0.75-8.25 % IT SOLN
INTRATHECAL | Status: DC | PRN
  Administered 2016-11-22: 1.2 mL via INTRATHECAL

## 2016-11-22 MED ORDER — OXYTOCIN 10 UNIT/ML IJ SOLN
INTRAMUSCULAR | Status: DC | PRN
  Administered 2016-11-22: 40 [IU] via INTRAVENOUS

## 2016-11-22 MED ORDER — OXYCODONE HCL 5 MG PO TABS: 5.0000 mg | ORAL_TABLET | ORAL | Status: DC | PRN

## 2016-11-22 MED ORDER — SCOPOLAMINE 1 MG/3DAYS TD PT72: 1.0000 | MEDICATED_PATCH | Freq: Once | TRANSDERMAL | Status: DC

## 2016-11-22 MED ORDER — ZOLPIDEM TARTRATE 5 MG PO TABS: 5.0000 mg | ORAL_TABLET | Freq: Every evening | ORAL | Status: DC | PRN

## 2016-11-22 MED ORDER — CEFAZOLIN SODIUM-DEXTROSE 2-3 GM-% IV SOLR: INTRAVENOUS | Status: DC | PRN

## 2016-11-22 MED ORDER — SODIUM CHLORIDE 0.9% FLUSH: 3.0000 mL | INTRAVENOUS | Status: DC | PRN

## 2016-11-22 MED ORDER — ONDANSETRON HCL 4 MG/2ML IJ SOLN
INTRAMUSCULAR | Status: DC | PRN
  Administered 2016-11-22: 4 mg via INTRAVENOUS

## 2016-11-22 MED ORDER — SIMETHICONE 80 MG PO CHEW
80.0000 mg | CHEWABLE_TABLET | ORAL | Status: DC
  Administered 2016-11-23 (×2): 80 mg via ORAL
  Filled 2016-11-22 (×2): qty 1

## 2016-11-22 MED ORDER — SOD CITRATE-CITRIC ACID 500-334 MG/5ML PO SOLN
30.0000 mL | Freq: Once | ORAL | Status: AC
  Administered 2016-11-22: 30 mL via ORAL
  Filled 2016-11-22: qty 15

## 2016-11-22 MED ORDER — SODIUM CHLORIDE 0.9 % IR SOLN
Status: DC | PRN
  Administered 2016-11-22: 1000 mL

## 2016-11-22 MED ORDER — PRENATAL MULTIVITAMIN CH
1.0000 | ORAL_TABLET | Freq: Every day | ORAL | Status: DC
  Administered 2016-11-23 – 2016-11-24 (×2): 1 via ORAL
  Filled 2016-11-22 (×2): qty 1

## 2016-11-22 MED ORDER — MORPHINE SULFATE (PF) 0.5 MG/ML IJ SOLN
INTRAMUSCULAR | Status: AC
  Filled 2016-11-22: qty 10

## 2016-11-22 MED ORDER — OXYTOCIN 40 UNITS IN LACTATED RINGERS INFUSION - SIMPLE MED: 2.5000 [IU]/h | INTRAVENOUS | Status: AC

## 2016-11-22 MED ORDER — EPHEDRINE 5 MG/ML INJ
INTRAVENOUS | Status: AC
  Filled 2016-11-22: qty 10

## 2016-11-22 MED ORDER — TETANUS-DIPHTH-ACELL PERTUSSIS 5-2.5-18.5 LF-MCG/0.5 IM SUSP: 0.5000 mL | Freq: Once | INTRAMUSCULAR | Status: DC

## 2016-11-22 MED ORDER — CEFAZOLIN SODIUM-DEXTROSE 2-4 GM/100ML-% IV SOLN
2.0000 g | INTRAVENOUS | Status: AC
  Administered 2016-11-22: 2 g via INTRAVENOUS
  Filled 2016-11-22: qty 100

## 2016-11-22 MED ORDER — DIBUCAINE 1 % RE OINT: 1.0000 "application " | TOPICAL_OINTMENT | RECTAL | Status: DC | PRN

## 2016-11-22 MED ORDER — MORPHINE SULFATE (PF) 0.5 MG/ML IJ SOLN
INTRAMUSCULAR | Status: DC | PRN
Start: 2016-11-22 — End: 2016-11-22
  Administered 2016-11-22: .2 mg via INTRATHECAL

## 2016-11-22 MED ORDER — KETOROLAC TROMETHAMINE 30 MG/ML IJ SOLN: 30.0000 mg | Freq: Four times a day (QID) | INTRAMUSCULAR | Status: DC | PRN

## 2016-11-22 MED ORDER — LACTATED RINGERS IV SOLN
INTRAVENOUS | Status: DC
  Administered 2016-11-22 (×3): via INTRAVENOUS

## 2016-11-22 MED ORDER — BUPIVACAINE IN DEXTROSE 0.75-8.25 % IT SOLN
INTRATHECAL | Status: AC
  Filled 2016-11-22: qty 2

## 2016-11-22 MED ORDER — ONDANSETRON HCL 4 MG/2ML IJ SOLN
4.0000 mg | Freq: Three times a day (TID) | INTRAMUSCULAR | Status: DC | PRN
  Administered 2016-11-22: 4 mg via INTRAVENOUS
  Filled 2016-11-22: qty 2

## 2016-11-22 MED ORDER — COCONUT OIL OIL
1.0000 "application " | TOPICAL_OIL | Status: DC | PRN
  Administered 2016-11-24: 1 via TOPICAL
  Filled 2016-11-22: qty 120

## 2016-11-22 MED ORDER — NALOXONE HCL 2 MG/2ML IJ SOSY: 1.0000 ug/kg/h | PREFILLED_SYRINGE | INTRAVENOUS | Status: DC | PRN

## 2016-11-22 MED ORDER — WITCH HAZEL-GLYCERIN EX PADS: 1.0000 "application " | MEDICATED_PAD | CUTANEOUS | Status: DC | PRN

## 2016-11-22 MED ORDER — FENTANYL CITRATE (PF) 100 MCG/2ML IJ SOLN
INTRAMUSCULAR | Status: AC
  Filled 2016-11-22: qty 2

## 2016-11-22 MED ORDER — SENNOSIDES-DOCUSATE SODIUM 8.6-50 MG PO TABS
2.0000 | ORAL_TABLET | ORAL | Status: DC
  Administered 2016-11-23 (×2): 2 via ORAL
  Filled 2016-11-22 (×2): qty 2

## 2016-11-22 MED ORDER — LACTATED RINGERS IV SOLN
INTRAVENOUS | Status: DC | PRN
  Administered 2016-11-22: 14:00:00 via INTRAVENOUS

## 2016-11-22 MED ORDER — PHENYLEPHRINE 8 MG IN D5W 100 ML (0.08MG/ML) PREMIX OPTIME
INJECTION | INTRAVENOUS | Status: AC
  Filled 2016-11-22: qty 100

## 2016-11-22 MED ORDER — KETOROLAC TROMETHAMINE 30 MG/ML IJ SOLN
30.0000 mg | Freq: Four times a day (QID) | INTRAMUSCULAR | Status: DC | PRN
  Administered 2016-11-22: 30 mg via INTRAMUSCULAR

## 2016-11-22 MED ORDER — LACTATED RINGERS IV SOLN
INTRAVENOUS | Status: DC
  Administered 2016-11-22: 125 mL/h via INTRAVENOUS

## 2016-11-22 MED ORDER — PROMETHAZINE HCL 25 MG/ML IJ SOLN: 6.2500 mg | INTRAMUSCULAR | Status: DC | PRN

## 2016-11-22 MED ORDER — ACETAMINOPHEN 325 MG PO TABS
650.0000 mg | ORAL_TABLET | ORAL | Status: DC | PRN
  Administered 2016-11-24: 650 mg via ORAL
  Filled 2016-11-22: qty 2

## 2016-11-22 MED ORDER — IBUPROFEN 600 MG PO TABS
600.0000 mg | ORAL_TABLET | Freq: Four times a day (QID) | ORAL | Status: DC
  Administered 2016-11-23 – 2016-11-24 (×7): 600 mg via ORAL
  Filled 2016-11-22 (×7): qty 1

## 2016-11-22 MED ORDER — KETOROLAC TROMETHAMINE 30 MG/ML IJ SOLN
INTRAMUSCULAR | Status: AC
  Filled 2016-11-22: qty 1

## 2016-11-22 MED ORDER — DIPHENHYDRAMINE HCL 25 MG PO CAPS: 25.0000 mg | ORAL_CAPSULE | ORAL | Status: DC | PRN

## 2016-11-22 MED ORDER — EPHEDRINE SULFATE 50 MG/ML IJ SOLN
INTRAMUSCULAR | Status: DC | PRN
  Administered 2016-11-22: 10 mg via INTRAVENOUS

## 2016-11-22 MED ORDER — DIPHENHYDRAMINE HCL 50 MG/ML IJ SOLN: 12.5000 mg | INTRAMUSCULAR | Status: DC | PRN

## 2016-11-22 MED ORDER — SIMETHICONE 80 MG PO CHEW: 80.0000 mg | CHEWABLE_TABLET | ORAL | Status: DC | PRN

## 2016-11-22 MED ORDER — SCOPOLAMINE 1 MG/3DAYS TD PT72
1.0000 | MEDICATED_PATCH | Freq: Once | TRANSDERMAL | Status: DC
  Administered 2016-11-22: 1.5 mg via TRANSDERMAL
  Filled 2016-11-22: qty 1

## 2016-11-22 MED ORDER — MIDAZOLAM HCL 2 MG/2ML IJ SOLN: 0.5000 mg | Freq: Once | INTRAMUSCULAR | Status: DC | PRN

## 2016-11-22 MED ORDER — ONDANSETRON HCL 4 MG/2ML IJ SOLN
INTRAMUSCULAR | Status: AC
  Filled 2016-11-22: qty 2

## 2016-11-22 SURGICAL SUPPLY — 35 items
BENZOIN TINCTURE PRP APPL 2/3 (GAUZE/BANDAGES/DRESSINGS) ×2 IMPLANT
CHLORAPREP W/TINT 26ML (MISCELLANEOUS) ×2 IMPLANT
CLAMP CORD UMBIL (MISCELLANEOUS) IMPLANT
CLOTH BEACON ORANGE TIMEOUT ST (SAFETY) ×2 IMPLANT
CONTAINER PREFILL 10% NBF 15ML (MISCELLANEOUS) IMPLANT
DRSG OPSITE POSTOP 4X10 (GAUZE/BANDAGES/DRESSINGS) ×2 IMPLANT
ELECT REM PT RETURN 9FT ADLT (ELECTROSURGICAL) ×2
ELECTRODE REM PT RTRN 9FT ADLT (ELECTROSURGICAL) ×1 IMPLANT
EXTRACTOR VACUUM KIWI (MISCELLANEOUS) IMPLANT
EXTRACTOR VACUUM M CUP 4 TUBE (SUCTIONS) IMPLANT
GAUZE SPONGE 4X4 12PLY STRL LF (GAUZE/BANDAGES/DRESSINGS) ×4 IMPLANT
GLOVE BIO SURGEON STRL SZ7 (GLOVE) ×2 IMPLANT
GLOVE BIOGEL PI IND STRL 7.0 (GLOVE) ×2 IMPLANT
GLOVE BIOGEL PI INDICATOR 7.0 (GLOVE) ×2
GOWN STRL REUS W/TWL LRG LVL3 (GOWN DISPOSABLE) ×4 IMPLANT
KIT ABG SYR 3ML LUER SLIP (SYRINGE) IMPLANT
NEEDLE HYPO 25X5/8 SAFETYGLIDE (NEEDLE) IMPLANT
NS IRRIG 1000ML POUR BTL (IV SOLUTION) ×2 IMPLANT
PACK C SECTION WH (CUSTOM PROCEDURE TRAY) ×2 IMPLANT
PAD ABD 7.5X8 STRL (GAUZE/BANDAGES/DRESSINGS) ×2 IMPLANT
PAD OB MATERNITY 4.3X12.25 (PERSONAL CARE ITEMS) ×2 IMPLANT
RTRCTR C-SECT PINK 25CM LRG (MISCELLANEOUS) IMPLANT
STRIP CLOSURE SKIN 1/2X4 (GAUZE/BANDAGES/DRESSINGS) ×2 IMPLANT
SUT MON AB-0 CT1 36 (SUTURE) ×4 IMPLANT
SUT PLAIN 0 NONE (SUTURE) IMPLANT
SUT PLAIN 2 0 (SUTURE)
SUT PLAIN ABS 2-0 CT1 27XMFL (SUTURE) IMPLANT
SUT VIC AB 0 CT1 27 (SUTURE) ×2
SUT VIC AB 0 CT1 27XBRD ANBCTR (SUTURE) ×2 IMPLANT
SUT VIC AB 2-0 CT1 27 (SUTURE) ×1
SUT VIC AB 2-0 CT1 TAPERPNT 27 (SUTURE) ×1 IMPLANT
SUT VIC AB 4-0 KS 27 (SUTURE) ×2 IMPLANT
SUT VICRYL 0 TIES 12 18 (SUTURE) IMPLANT
TOWEL OR 17X24 6PK STRL BLUE (TOWEL DISPOSABLE) ×2 IMPLANT
TRAY FOLEY BAG SILVER LF 14FR (SET/KITS/TRAYS/PACK) IMPLANT

## 2016-11-22 NOTE — Lactation Note (Signed)
This note was copied from a baby's chart. Lactation Consultation Note  Patient Name: Kristine [name] [id number] Date: 11/22/2016 Reason for consult: Initial assessment   Initial assessment with Exp BF mom of 1 hour old infant in PACU. Mom had infant latched and feeding on the left breast when LC entered room. Mom reports infant nursed in OR prior to coming to PACU. Infant actively feeding with flanged lips, rhythmic suckles and intermittent swallows. Mom denies pain/pinching.   Mom with soft compressible breasts and areola with everted nipples. Colostrum not expressible from right breast, infant latched to left breast. Mom reports she plans to BF for 36 years. She Bf her 36 yo for 6 months and he self weaned.   Enc mom to feed infant STS 8-12 x in 24 hours offering both breasts with each feeding. Enc mom to use pillow and head support with feeding. Enc mom to massage/compress breast with feeding. Mom says she is aware of how to hand express, enc mom to hand express before and after each feeding. Feeding log given with instruction for use. Reviewed BF basics, pillow and head support, colostrum, milk coming to volume, NB nutritional needs, STS and hand expression.   BF Resource Handout and LC Brochure given, mom informed of IP/OP services, BF Support Groups and LC phone #. Enc mom to call out for feeding assistance as needed. Mom without further questions/concerns at this time. Mom is a [facility] Client and is aware to call and make appt post d/c.    Maternal Data Formula Feeding for Exclusion: No Reason for exclusion: Mother's choice to formula and breast feed on admission Has patient been taught Hand Expression?: Yes Does the patient have breastfeeding experience prior to this delivery?: Yes  Feeding Feeding Type: Breast Fed Length of feed: 10 min  LATCH Score/Interventions Latch: Grasps breast easily, tongue down, lips flanged, rhythmical sucking.  Audible Swallowing: Spontaneous and  intermittent  Type of Nipple: Everted at rest and after stimulation  Comfort (Breast/Nipple): Soft / non-tender     Hold (Positioning): No assistance needed to correctly position infant at breast. Intervention(s): Breastfeeding basics reviewed;Support Pillows;Position options;Skin to skin  LATCH Score: 10  Lactation Tools Discussed/Used WIC Program: Yes   Consult Status Consult Status: Follow-up Date: 11/23/16 Follow-up type: In-patient    Silas Flood Jhoselyn Ruffini 11/22/2016, 3:11 PM

## 2016-11-22 NOTE — Transfer of Care (Signed)
Immediate Anesthesia Transfer of Care Note  Patient: Kristine [name]  Procedure(s) Performed: Procedure(s) with comments: REPEAT CESAREAN SECTION (N/A) - EDD: 11/29/16  Patient Location: PACU  Anesthesia Type:Spinal  Level of Consciousness: awake, alert , oriented and patient cooperative  Airway & Oxygen Therapy: Patient Spontanous Breathing  Post-op Assessment: Report given to RN and Post -op Vital signs reviewed and stable  Post vital signs: Reviewed and stable  Last Vitals: There were no vitals filed for this visit.  Last Pain: There were no vitals filed for this visit.       Complications: No apparent anesthesia complications

## 2016-11-22 NOTE — Addendum Note (Signed)
Addendum  created 11/22/16 1917 by Junious Silk, CRNA   Charge Capture section accepted, Sign clinical note, Visit diagnoses modified

## 2016-11-22 NOTE — Anesthesia Postprocedure Evaluation (Signed)
Anesthesia Post Note  Patient: Kristine [name]  Procedure(s) Performed: Procedure(s) (LRB): REPEAT CESAREAN SECTION (N/A)  Patient location during evaluation: PACU Anesthesia Type: Spinal Level of consciousness: awake and alert, patient cooperative and oriented Pain management: pain level controlled Vital Signs Assessment: post-procedure vital signs reviewed and stable Respiratory status: spontaneous breathing, nonlabored ventilation and respiratory function stable Cardiovascular status: blood pressure returned to baseline and stable Postop Assessment: no signs of nausea or vomiting, patient able to bend at knees and spinal receding Anesthetic complications: no        Last Vitals:  Vitals:   11/22/16 1545 11/22/16 1600  BP: 104/71 (!) 102/58  Pulse: 68 70  Resp: 16 (!) 22  Temp:  36.2 C    Last Pain:  Vitals:   11/22/16 1600  TempSrc: Oral   Pain Goal:                 Kristine [name],E. Tomeko Scoville

## 2016-11-22 NOTE — Progress Notes (Signed)
Pt reqesting orthostatics be delayed til after bath and hour of skin to skin

## 2016-11-22 NOTE — Anesthesia Procedure Notes (Signed)
Spinal  Patient location during procedure: OR End time: 11/22/2016 1:27 PM Staffing Anesthesiologist: Jairo Ben Performed: anesthesiologist  Preanesthetic Checklist Completed: patient identified, surgical consent, pre-op evaluation, timeout performed, IV checked, risks and benefits discussed and monitors and equipment checked Spinal Block Patient position: sitting Prep: site prepped and draped and DuraPrep Patient monitoring: blood pressure, continuous pulse ox and heart rate Approach: midline Location: L3-4 Injection technique: single-shot Needle Needle type: Pencan  Needle gauge: 24 G Needle length: 9 cm Catheter size: 19 g Additional Notes Pt identified in Operating room.  Monitors applied. Working IV access confirmed. Sterile prep, drape lumbar spine.  1% lido local L 3,4.  #24ga Pencan into clear CSF L 3,4.  9 mg 0.75% Bupivacaine with dextrose, fentanyl, morphine injected with asp CSF beginning and end of injection.  Patient asymptomatic, VSS, no heme aspirated, tolerated well.  Sandford Craze, MD

## 2016-11-22 NOTE — Anesthesia Preprocedure Evaluation (Signed)
Anesthesia Evaluation  Patient identified by MRN, date of birth, ID band Patient awake    Reviewed: Allergy & Precautions, NPO status , Patient's Chart, lab work & pertinent test results  History of Anesthesia Complications Negative for: history of anesthetic complications  Airway Mallampati: II  TM Distance: >3 FB Neck ROM: Full    Dental  (+) Dental Advisory Given   Pulmonary neg pulmonary ROS,    breath sounds clear to auscultation       Cardiovascular negative cardio ROS   Rhythm:Regular Rate:Normal + Systolic murmurs (II/VI)    Neuro/Psych negative neurological ROS     GI/Hepatic negative GI ROS, Neg liver ROS,   Endo/Other  negative endocrine ROS  Renal/GU negative Renal ROS     Musculoskeletal   Abdominal   Peds  Hematology negative hematology ROS (+) plt 255k   Anesthesia Other Findings   Reproductive/Obstetrics (+) Pregnancy                             Anesthesia Physical Anesthesia Plan  ASA: II  Anesthesia Plan: Spinal   Post-op Pain Management:    Induction:   Airway Management Planned: Natural Airway  Additional Equipment:   Intra-op Plan:   Post-operative Plan:   Informed Consent: I have reviewed the patients History and Physical, chart, labs and discussed the procedure including the risks, benefits and alternatives for the proposed anesthesia with the patient or authorized representative who has indicated his/her understanding and acceptance.   Dental advisory given  Plan Discussed with: CRNA and Surgeon  Anesthesia Plan Comments: (Plan routine monitors, SAB)        Anesthesia Quick Evaluation

## 2016-11-22 NOTE — OR Nursing (Signed)
Gold bangle bracelets on right wrist wrapped up family aware of potential risk  Anesthesia and Dr. Juliene Pina aware.

## 2016-11-22 NOTE — H&P (Signed)
Kristine [name] is a 36 y.o. female presenting for repeat C/section at 39 wks.  Lots of Ob visits for contractions, back pain, pelvic pressure, requiring to cut back work (CNA),otherwise uncomplicated pregnancy. Wants to deliver ASAP, declines to wait for TOLAC.   OB History    Gravida Para Term Preterm AB Living   SAB TAB Ectopic Multiple Live Births           1     Past Medical History:  Diagnosis Date  . Acute blood loss anemia 02/21/2013  . Anemia, iron deficiency 02/21/2013  . Constipation, chronic 12/12/2012  . Medical history non-contributory   . Postpartum care following cesarean delivery (7/11) 02/21/2013  . Previous cesarean delivery, antepartum condition or complication 11/22/2016   Past Surgical History:  Procedure Laterality Date  . CESAREAN SECTION N/A 02/20/2013   Procedure: CESAREAN SECTION;  Surgeon: Robley Fries, MD;  Location: [facility];  Service: Obstetrics;  Laterality: N/A;  . NO PAST SURGERIES     Family History: family history includes Cancer in her mother; Hypertension in her mother and paternal uncle. Social History:  reports that she has never smoked. She has never used smokeless tobacco. She reports that she does not drink alcohol or use drugs.     Maternal Diabetes: No Genetic Screening: Declined Maternal Ultrasounds/Referrals: Normal but last sono at 37 wks -SGA 5'7" at 15% and AC at 3%, normal Dopplers and normal AFI Fetal Ultrasounds or other Referrals:  None Maternal Substance Abuse:  No Significant Maternal Medications:  None Significant Maternal Lab Results:  Lab values include: Group B Strep positive Other Comments:  None  ROS neg History   Last menstrual period 08/23/2015, unknown if currently breastfeeding. Exam Physical Exam  Physical exam:  A&O x 3, no acute distress. Pleasant HEENT neg, no thyromegaly Lungs CTA bilat CV RRR, S1S2 normal Abdo soft, non tender, non acute Extr no edema/ tenderness Pelvic defr FHT-  present 140s Toco - random per pt   Prenatal labs: ABO, Rh: --/--/O POS (04/11 1053) Antibody: NEG (04/11 1053) Rubella: Immune (09/22 0000) RPR: Non Reactive (04/11 1053)  HBsAg: Negative (09/22 0000)  HIV: Non-reactive (09/22 0000)  GBS: Positive (01/18 0000)   Assessment/Plan: 36 yo G2P1, here for Repeat C/section per request at 39 wks.  Risks/complications of surgery reviewed incl infection, bleeding, damage to internal organs including bladder, bowels, ureters, blood vessels, other risks from anesthesia, VTE and delayed complications of any surgery, complications in future surgery reviewed. Also discussed neonatal complications incl difficult delivery, laceration, vacuum assistance, TTN etc. Pt understands and agrees, all concerns addressed.       Kristine [name] R 11/22/2016, 12:46 PM

## 2016-11-22 NOTE — Anesthesia Postprocedure Evaluation (Addendum)
Anesthesia Post Note  Patient: Kristine [name]  Procedure(s) Performed: Procedure(s) (LRB): REPEAT CESAREAN SECTION (N/A)  Patient location during evaluation: Mother Baby Anesthesia Type: Spinal Level of consciousness: awake and alert Pain management: pain level controlled Vital Signs Assessment: post-procedure vital signs reviewed and stable Respiratory status: spontaneous breathing, nonlabored ventilation and respiratory function stable Cardiovascular status: stable Postop Assessment: no headache, no backache and epidural receding Anesthetic complications: no        Last Vitals:  Vitals:   11/22/16 1740 11/22/16 1832  BP: (!) 105/55 101/61  Pulse: 72 75  Resp: 18 17  Temp: 36.5 C 36.7 C    Last Pain:  Vitals:   11/22/16 1832  TempSrc: Oral   Pain Goal:                 Junious Silk

## 2016-11-22 NOTE — Op Note (Signed)
Cesarean Section Procedure Note   Kristine [name]  11/22/2016  Indications: Scheduled Proceedure/Maternal Request for repeat C-section, 39 wks    Pre-operative Diagnosis: Previous Cesarean Section.   Post-operative Diagnosis: Same   Surgeon:  Shea Evans, MD  Assistants: Marlinda Mike, CNM  Anesthesia: spinal   Procedure Details:  The patient was seen in the Holding Room. The risks, benefits, complications, treatment options, and expected outcomes were discussed with the patient. The patient concurred with the proposed plan, giving informed consent. identified as [name] and the procedure verified as C-Section Delivery. A Time Out was held and the above information confirmed. 2 gm Ancef given.  After induction of anesthesia, the patient was draped and prepped in the usual sterile manner and foley was placed. A pfannenstiel incision was made and carried down through the subcutaneous tissue to the fascia. Fascial incision was made and extended transversely. The fascia was separated from the underlying rectus tissue superiorly and inferiorly. The peritoneum was identified and entered. Peritoneal incision was extended longitudinally. Alexis retractor placed.  The utero-vesical peritoneal reflection was incised transversely and the bladder flap was bluntly freed from the lower uterine segment. A low transverse uterine incision was made. Delivered from cephalic presentation was a vigorous FEMALE infant at 13.56 hours on 11/22/16, with Apgar scores of 8 at one minute and 9 at five minutes. Cord ph was not sent. Delayed cord clamping done at 1 minute. Umbilical cord blood was obtained for evaluation. The placenta was removed Intact and appeared normal. The uterine outline, tubes and ovaries appeared normal. The uterine incision was closed with running locked sutures of followed by a second imbricating layer.  Hemostasis was observed. Peritoneal closure done with 2-0 Vicryl. The  fascia was then reapproximated with running sutures of 0Vicryl. The subcuticular closure was performed using 2-0plain gut. The skin was closed with 4-0Vicryl. Sterile dressings placed.   Instrument, sponge, and needle counts were correct prior the abdominal closure and were correct at the conclusion of the case.    Findings:  Baby GIRL, born at 13.56 hours on 11/22/2016 from [facility] hysterotomy. Clear amniotic fluid. Apgars 8 and 9. Cord thin, placenta normal. Normal uterus and bilateral tubes and ovaries    Estimated Blood Loss: 600CC  Total IV Fluids: 2100 ml lr   Urine Output: 200 CCCC OF clear urine  Specimens: cord blood   Complications: no complications  Disposition: PACU - hemodynamically stable.   Maternal Condition: stable   Baby condition / location:  Couplet care / Skin to Skin  Attending Attestation: I performed the procedure.   Signed: Surgeon(s): Shea Evans, MD

## 2016-11-23 ENCOUNTER — Encounter (HOSPITAL_COMMUNITY): Payer: Self-pay | Admitting: *Deleted

## 2016-11-23 LAB — CBC
HEMATOCRIT: 31.3 % — AB (ref 36.0–46.0)
Hemoglobin: 10.5 g/dL — ABNORMAL LOW (ref 12.0–15.0)
MCH: 31.7 pg (ref 26.0–34.0)
MCHC: 33.5 g/dL (ref 30.0–36.0)
MCV: 94.6 fL (ref 78.0–100.0)
PLATELETS: 225 10*3/uL (ref 150–400)
RBC: 3.31 MIL/uL — AB (ref 3.87–5.11)
RDW: 14.3 % (ref 11.5–15.5)
WBC: 12.3 10*3/uL — ABNORMAL HIGH (ref 4.0–10.5)

## 2016-11-23 LAB — BIRTH TISSUE RECOVERY COLLECTION (PLACENTA DONATION)

## 2016-11-23 NOTE — Clinical Social Work Maternal (Signed)
  CLINICAL SOCIAL WORK MATERNAL/CHILD NOTE  Patient Details  Name: Kristine [name] MRN: [id number] Date of Birth: 29-May-1981  Date:  11/23/2016  Clinical Social Worker Initiating Note:  Laurey Arrow Date/ Time Initiated:  11/23/16/1544     Child's Name:  Kristine [name]   Legal Guardian:  Mother   Need for Interpreter:  None   Date of Referral:  11/16/16     Reason for Referral:  Behavioral Health Issues, including SI  (hx of anxiety/depression)   Referral Source:  [facility]   Address:  [address] [place] [address]  Phone number:  [phone]   Household Members:  Self, Minor Children, Spouse (MOB and FOB resides with FOB's mother and brother. )   Natural Supports (not living in the home):  Friends   Professional Supports: None   Employment:     Type of Work:     Education:  Economist:  Multimedia programmer   Other Resources:      Cultural/Religious Considerations Which May Impact Care:  Per [employer] Presenter, broadcasting, MOB is Muslim  Strengths:  Ability to meet basic needs , Understanding of illness, Home prepared for child , Pediatrician chosen    Risk Factors/Current Problems:  Mental Health Concerns    Cognitive State:  Alert , Able to Concentrate , Linear Thinking , Insightful , Goal Oriented    Mood/Affect:  Bright , Happy , Interested , Comfortable    CSW Assessment: CSW met with MOB to complete an assessment for hx anxiety/depression and PPD. MOB was resting in bed and infant was asleep in bassinet.  With MOB's permission, CSW asked MOB's guest to leave the room in effort to meet with MOB in private. 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  MOB reported that MOB's symptoms initiated when MOB move to the [place] from MOB's native country.  MOB expressed that MOB missed her family and found it difficult for MOB to cope.  CSW normalized and validated MOB's thoughts  and feelings.  MOB denied currently being on medication sn communicated that MOB discontinued Zoloft bout 2 months ago.  MOB also denied being in counseling. CSW educated MOB about PPD.  CSW also encouraged MOB to seek medical attention if needed for increased signs and symptoms for PPD. CSW offered MOB resources for outpatient counseling, and MOB declined. MOB expressed that MOB feels comfortable asking for help and will reach out to MOB's OBGYN if help is warranted. CSW gave MOB a PPD checklist and encouraged MOB to utilize it weekly; MOB agreed. CSW provided MOB with CSW contact information and encouraged MOB to reach out to CSW if MOB had any questions or concerns.  MOB reports having a wealth of support from FOB's immediate and extended local family.   CSW Plan/Description:  Information/Referral to [employer] , No Further Intervention Required/No Barriers to Discharge   Laurey Arrow, MSW, LCSW Clinical Social Work [phone]    Dimple Nanas, LCSW 11/23/2016, 3:47 PM

## 2016-11-23 NOTE — Progress Notes (Signed)
Post Partum Day 1, RC/s POD#1  Subjective: no complaints, up ad lib, voiding and tolerating PO, flatus +. C/s pain controlled with oral agents, happy.   Objective: Blood pressure (!) 94/54, pulse 77, temperature 98.2 F (36.8 C), temperature source Oral, resp. rate 16, last menstrual period 08/23/2015, SpO2 98 %, unknown if currently breastfeeding.  Physical Exam:  General: alert and cooperative Lochia: appropriate Uterine Fundus: firm, active bowel sounds  Incision: dressing dry DVT Evaluation: No evidence of DVT seen on physical exam.   Recent Labs  11/21/16 1053 11/23/16 0652  HGB 12.5 10.5*  HCT 36.5 31.3*   O(+) Rub Immune  GIRL, with mother, breast feeding well    Assessment/Plan: Breastfeeding and Lactation consult  Routine PP care Possible D/c home -early D/c on POD#2 tomorrow    LOS: 1 day   Kristine [name] R 11/23/2016, 1:44 PM

## 2016-11-23 NOTE — Lactation Note (Signed)
This note was copied from a baby's chart. Lactation Consultation Note  Patient Name: Kristine [name] Date: 11/23/2016 Reason for consult: Follow-up assessment;Infant weight loss (3% weight loss , bili at 10 = 3.5 ) Baby is 25 hours old and has been to the breast consistently since birth. Baby has been exclusively breast feeding and not supplementing. Mom came in as breast / formula.  Per mom feels breast feeding is going well, and denies sore nipples.  LC recommended STS feedings - breast massage, hand express, latch , and after 1st breast offer 2nd breast.  LC reviewed cluster feeding.    Maternal Data    Feeding  LATCH Score/Interventions                Intervention(s): Breastfeeding basics reviewed     Lactation Tools Discussed/Used     Consult Status Consult Status: Follow-up Date: 11/24/16 Follow-up type: In-patient    Kristine [name] 11/23/2016, 3:33 PM

## 2016-11-24 MED ORDER — OXYCODONE HCL 5 MG PO TABS: 5.0000 mg | ORAL_TABLET | ORAL | 0 refills | Status: DC | PRN

## 2016-11-24 MED ORDER — COCONUT OIL OIL
1.0000 | TOPICAL_OIL | 0 refills | Status: DC | PRN
Start: 2016-11-24 — End: 2017-07-07

## 2016-11-24 MED ORDER — IBUPROFEN 600 MG PO TABS: 600.0000 mg | ORAL_TABLET | Freq: Four times a day (QID) | ORAL | 0 refills | Status: DC

## 2016-11-24 NOTE — Discharge Summary (Signed)
OB Discharge Summary     Patient Name: Kristine [name] DOB: [birth year] MRN: [id number]  Date of admission: 11/22/2016 Delivering MD: MODY, VAISHALI   Date of discharge: 11/24/2016  Admitting diagnosis: Previous Cesarean Section Intrauterine pregnancy: [redacted]w[redacted]d     Secondary diagnosis:  Principal Problem:   Postpartum care following cesarean delivery (4/12) Active Problems:   Previous cesarean delivery, antepartum condition or complication   Cesarean delivery delivered      Discharge diagnosis: Term Pregnancy Delivered                                                                                                Post partum procedures:none  Complications: None  Hospital course:  Sceduled C/S   36 y.o. yo G2P2002 at [redacted]w[redacted]d was admitted to the hospital 11/22/2016 for scheduled cesarean section with the following indication:Elective Repeat.  Membrane Rupture Time/Date: 1:54 PM ,11/22/2016   Patient delivered a Viable girl infant.11/22/2016  Details of operation can be found in separate operative note.  Pateint had an uncomplicated postpartum course.  [name], tolerating a regular diet, passing flatus, and urinating well. Patient is discharged home in stable condition on  11/25/16         Physical exam  Vitals:   11/23/16 1400 11/23/16 1830 11/23/16 2013 11/24/16 0500  BP: (!) 95/52 (!) 96/54  109/74  Pulse: 75 72  88  Resp: Temp: 97.9 F (36.6 C) 98.1 F (36.7 C)  98.3 F (36.8 C)  TempSrc: Oral Oral  Oral  SpO2:      Weight:   75.8 kg (167 lb)   Height:    (1.6 m)    General: alert, cooperative and no distress Lochia: appropriate Uterine Fundus: firm Incision: Dressing is clean, dry, and intact DVT Evaluation: No cords or calf tenderness. No significant calf/ankle edema. Labs: Lab Results  Component Value Date   WBC 12.3 (H) 11/23/2016   HGB 10.5 (L) 11/23/2016   HCT 31.3 (L) 11/23/2016   MCV 94.6 11/23/2016   PLT 225 11/23/2016   CMP  Latest Ref Rng & Units 08/11/2012  Glucose 70 - 99 mg/dL 90  BUN 6 - 23 mg/dL 7  Creatinine 4.09 - 8.11 mg/dL 9.14(N)  Sodium 829 - 562 mEq/L 133(L)  Potassium 3.5 - 5.1 mEq/L 3.0(L)  Chloride 96 - 112 mEq/L 102  CO2 19 - 32 mEq/L 18(L)  Calcium 8.4 - 10.5 mg/dL 8.8  Total Protein 6.0 - 8.3 g/dL 6.3  Total Bilirubin 0.3 - 1.2 mg/dL 0.6  Alkaline Phos 39 - 117 U/L 50  AST 0 - 37 U/L 12  ALT 0 - 35 U/L 11    Discharge instruction: per After Visit Summary and "Baby and Me Booklet".  After visit meds:  Allergies as of 11/24/2016   No Known Allergies     Medication List    TAKE these medications   cholecalciferol 1000 units tablet Commonly known as:  VITAMIN D Take 1,000 Units by mouth at bedtime.   coconut oil Oil Apply 1 application topically as needed.   ferrous  sulfate 325 (65 FE) MG EC tablet Take 325 mg by mouth at bedtime.   hydrocortisone cream 1 % Apply 1 application topically 3 (three) times daily as needed for itching (applied to stomach).   ibuprofen 600 MG tablet Commonly known as:  ADVIL,MOTRIN Take 1 tablet (600 mg total) by mouth every 6 (six) hours.   oxyCODONE 5 MG immediate release tablet Commonly known as:  Oxy IR/ROXICODONE Take 1 tablet (5 mg total) by mouth every 4 (four) hours as needed (pain scale 4-7).   terconazole 0.4 % vaginal cream Commonly known as:  TERAZOL 7 Place 1 applicator vaginally at bedtime.   VITAFOL ULTRA 29-0.6-0.4-200 MG Caps Take 1 tablet by mouth at bedtime.       Diet: routine diet  Activity: Advance as tolerated. Pelvic rest for 6 weeks.   Outpatient follow up:6 weeks  Postpartum contraception: Not Discussed  Newborn Data: Live born female [name] Birth Weight: 7 lb 3.9 oz (3285 g) APGAR: 8, 9  Baby Feeding: Breast Disposition:home with mother   11/24/2016 Neta Mends, CNM

## 2016-11-24 NOTE — Progress Notes (Signed)
Subjective: POD# 1 Information for the patient's newborn:  Kristine, [name] [[id number]]  female  Baby name: Kristine [name]  Reports feeling well, desires DC home Feeding: breast and bottle Patient reports tolerating PO.  Breast symptoms: none Pain controlled with PO meds Denies HA/SOB/C/P/N/V/dizziness. Flatus present. She reports vaginal bleeding as normal, without clots.  [name], urinating without difficulty.     Objective:   VS:    Vitals:   11/23/16 1400 11/23/16 1830 11/23/16 2013 11/24/16 0500  BP: (!) 95/52 (!) 96/54  109/74  Pulse: 75 72  88  Resp: Temp: 97.9 F (36.6 C) 98.1 F (36.7 C)  98.3 F (36.8 C)  TempSrc: Oral Oral  Oral  SpO2:      Weight:   75.8 kg (167 lb)   Height:    (1.6 m)      Intake/Output Summary (Last 24 hours) at 11/24/16 1055 Last data filed at 11/23/16 1400  Gross per 24 hour  Intake                0 ml  Output             1150 ml  Net            -1150 ml        Recent Labs  11/23/16 0652  WBC 12.3*  HGB 10.5*  HCT 31.3*  PLT 225     Blood type: --/--/O POS (04/11 1053)  Rubella: Immune (09/22 0000)     Physical Exam:  General: alert, cooperative and no distress Abdomen: soft, nontender, normal bowel sounds Incision: dry, intact and serous and 1/3 of dressing drainage present Uterine Fundus: firm, below umbilicus, nontender Lochia: minimal Ext: edema +1 pedal, no cords or tenderness      Assessment/Plan: 36 y.o.   POD# 2. [id number]                  Principal Problem:   Postpartum care following cesarean delivery (4/12) Active Problems:   Previous cesarean delivery, antepartum condition or complication   Cesarean delivery delivered   Doing well, stable.                           DC home today w/ instructions  F/U at [facility] in 6 weeks and PRN   Neta Mends, CNM, MSN 11/24/2016, 10:55 AM

## 2017-02-25 NOTE — Addendum Note (Signed)
Addendum  created 02/25/17 1741 by Jairo BenJackson, Marasia Newhall, MD   Sign clinical note

## 2017-07-07 ENCOUNTER — Other Ambulatory Visit: Payer: Self-pay

## 2017-07-07 ENCOUNTER — Emergency Department (HOSPITAL_COMMUNITY)
Admission: EM | Admit: 2017-07-07 | Discharge: 2017-07-07 | Disposition: A | Discharge status: Home / Self Care | Attending: Emergency Medicine | Admitting: Emergency Medicine

## 2017-07-07 ENCOUNTER — Encounter (HOSPITAL_COMMUNITY): Payer: Self-pay | Admitting: *Deleted

## 2017-07-07 DIAGNOSIS — Z209 Contact with and (suspected) exposure to unspecified communicable disease: Secondary | ICD-10-CM | POA: Diagnosis not present

## 2017-07-07 DIAGNOSIS — B9789 Other viral agents as the cause of diseases classified elsewhere: Secondary | ICD-10-CM | POA: Insufficient documentation

## 2017-07-07 DIAGNOSIS — R0981 Nasal congestion: Secondary | ICD-10-CM | POA: Insufficient documentation

## 2017-07-07 DIAGNOSIS — M791 Myalgia, unspecified site: Secondary | ICD-10-CM | POA: Diagnosis not present

## 2017-07-07 DIAGNOSIS — R05 Cough: Secondary | ICD-10-CM | POA: Diagnosis present

## 2017-07-07 DIAGNOSIS — R509 Fever, unspecified: Secondary | ICD-10-CM | POA: Insufficient documentation

## 2017-07-07 DIAGNOSIS — J069 Acute upper respiratory infection, unspecified: Secondary | ICD-10-CM | POA: Diagnosis not present

## 2017-07-07 MED ORDER — BENZONATATE 100 MG PO CAPS
100.0000 mg | ORAL_CAPSULE | Freq: Three times a day (TID) | ORAL | 0 refills | Status: DC
Start: 2017-07-07 — End: 2018-03-11

## 2017-07-07 NOTE — ED Triage Notes (Signed)
The pt has had  A cough headache since yesterday and some anterior neck pain she had a sl fever yesterday  None today  lmp oct 25th

## 2017-07-07 NOTE — ED Provider Notes (Signed)
[facility] Provider Note   CSN: [id number] Arrival date & time: 07/07/17  0422     History   Chief Complaint Chief Complaint  Patient presents with  . Cough    HPI Kristine [name] is a 36 y.o. female.  Patient presents with c/o cough for the past 1-2 days. She reports low grade fever, facial pressure, some congestion. She reports generalized body aches. Several family members have been sick at home including husband and both children. No vomiting.   The history is provided by the patient. No language interpreter was used.    Past Medical History:  Diagnosis Date  . Acute blood loss anemia 02/21/2013  . Anemia, iron deficiency 02/21/2013  . Constipation, chronic 12/12/2012  . Medical history non-contributory   . Postpartum care following cesarean delivery (7/11) 02/21/2013  . Previous cesarean delivery, antepartum condition or complication 11/22/2016    Patient Active Problem List   Diagnosis Date Noted  . Previous cesarean delivery, antepartum condition or complication 11/22/2016  . Cesarean delivery delivered 11/22/2016  . Postpartum care following cesarean delivery (4/12) 11/22/2016  . Hemorrhoids, external, thrombosed s/p excision 12/12/2012 12/12/2012  . Constipation, chronic 12/12/2012    Past Surgical History:  Procedure Laterality Date  . CESAREAN SECTION N/A 02/20/2013   Procedure: CESAREAN SECTION;  Surgeon: Robley FriesVaishali R Mody, MD;  Location: [facility];  Service: Obstetrics;  Laterality: N/A;  . CESAREAN SECTION N/A 11/22/2016   Procedure: REPEAT CESAREAN SECTION;  Surgeon: Shea EvansVaishali Mody, MD;  [facility];  Service: Obstetrics;  Laterality: N/A;  EDD: 11/29/16  . NO PAST SURGERIES      OB History    Gravida Para Term Preterm AB Living   2 2 2     2    SAB TAB Ectopic Multiple Live Births         0 2       Home Medications    Prior to Admission medications   Medication Sig Start Date End Date Taking?  Authorizing Provider  acetaminophen (TYLENOL) 325 MG tablet Take by mouth every 6 (six) hours as needed.   Yes [provider]  benzonatate (TESSALON) 100 MG capsule Take 1 capsule (100 mg total) by mouth every 8 (eight) hours. 07/07/17   Elpidio AnisUpstill, Tayshaun Kroh, PA-C  oxyCODONE (OXY IR/ROXICODONE) 5 MG immediate release tablet Take 1 tablet (5 mg total) by mouth every 4 (four) hours as needed (pain scale 4-7). Patient not taking: Reported on 07/07/2017 11/24/16   Neta MendsPaul, Daniela C, CNM    Family History Family History  Problem Relation Age of Onset  . Hypertension Mother   . Cancer Mother        uterus  . Hypertension Paternal Uncle   . Other Neg Hx   . Hearing loss Neg Hx   . Alcohol abuse Neg Hx   . Arthritis Neg Hx   . Asthma Neg Hx   . Birth defects Neg Hx   . COPD Neg Hx   . Depression Neg Hx   . Diabetes Neg Hx   . Drug abuse Neg Hx   . Early death Neg Hx   . Heart disease Neg Hx   . Hyperlipidemia Neg Hx   . Kidney disease Neg Hx   . Learning disabilities Neg Hx   . Mental illness Neg Hx   . Mental retardation Neg Hx   . Miscarriages / Stillbirths Neg Hx   . Stroke Neg Hx   . Vision loss Neg  Hx     Social History Social History   Tobacco Use  . Smoking status: Never Smoker  . Smokeless tobacco: Never Used  Substance Use Topics  . Alcohol use: No  . Drug use: No     Allergies   Patient has no known allergies.   Review of Systems Review of Systems  Constitutional: Negative for chills and fever.  HENT: Positive for congestion, sinus pressure and voice change.   Respiratory: Positive for cough. Negative for shortness of breath.   Cardiovascular: Negative.   Gastrointestinal: Negative.  Negative for nausea and vomiting.  Musculoskeletal: Positive for myalgias.  Skin: Negative.   Neurological: Negative.      Physical Exam Updated Vital Signs BP 128/74 (BP Location: Right Arm)   Pulse 77   Temp 98.7 F (37.1 C) (Oral)   Resp 18   Ht 5\' 2"  (1.575  m)   Wt 75.8 kg (167 lb)   SpO2 99%   BMI 30.54 kg/m   Physical Exam  Constitutional: She is oriented to person, place, and time. She appears well-developed and well-nourished.  HENT:  Head: Normocephalic.  Right Ear: Tympanic membrane normal.  Left Ear: Tympanic membrane normal.  Nose: Nose normal.  Mouth/Throat: Oropharynx is clear and moist.  Neck: Normal range of motion. Neck supple.  Cardiovascular: Normal rate and regular rhythm.  Pulmonary/Chest: Effort normal and breath sounds normal.  Abdominal: Soft. Bowel sounds are normal. There is no tenderness. There is no rebound and no guarding.  Musculoskeletal: Normal range of motion.  Neurological: She is alert and oriented to person, place, and time.  Skin: Skin is warm and dry. No rash noted.  Psychiatric: She has a normal mood and affect.     ED Treatments / Results  Labs (all labs ordered are listed, but only abnormal results are displayed) Labs Reviewed - No data to display  EKG  EKG Interpretation None       Radiology No results found.  Procedures Procedures (including critical care time)  Medications Ordered in ED Medications - No data to display   Initial Impression / Assessment and Plan / ED Course  I have reviewed the triage vital signs and the nursing notes.  Pertinent labs & imaging results that were available during my care of the patient were reviewed by me and considered in my medical decision making (see chart for details).     Patient presents with URI symptoms. Normal exam with normal VS. She is very well appearing. No concern for bacterial infection. With multiple family members with same symptoms, strongly suspect viral process.  Final Clinical Impressions(s) / ED Diagnoses   Final diagnoses:  Viral URI with cough    ED Discharge Orders        Ordered    benzonatate (TESSALON) 100 MG capsule  Every 8 hours     07/07/17 0506       Elpidio AnisUpstill, Pruitt Taboada, PA-C 07/07/17 0603      Gilda CreasePollina, Christopher J, MD 07/07/17 [phone]

## 2017-09-30 LAB — OB RESULTS CONSOLE HIV ANTIBODY (ROUTINE TESTING): HIV: NONREACTIVE

## 2017-09-30 LAB — OB RESULTS CONSOLE GC/CHLAMYDIA
Chlamydia: NEGATIVE
GC PROBE AMP, GENITAL: NEGATIVE

## 2017-09-30 LAB — OB RESULTS CONSOLE ABO/RH: RH TYPE: POSITIVE

## 2017-09-30 LAB — OB RESULTS CONSOLE ANTIBODY SCREEN: Antibody Screen: NEGATIVE

## 2017-09-30 LAB — OB RESULTS CONSOLE RUBELLA ANTIBODY, IGM: Rubella: IMMUNE

## 2017-09-30 LAB — OB RESULTS CONSOLE RPR: RPR: NONREACTIVE

## 2017-09-30 LAB — OB RESULTS CONSOLE HEPATITIS B SURFACE ANTIGEN: Hepatitis B Surface Ag: NEGATIVE

## 2017-09-30 NOTE — Progress Notes (Signed)
Psychiatric Initial Adult Assessment   Patient Identification: Kristine [name] MRN:  [id number] Date of Evaluation:  10/01/2017 Referral Source: Jordan Hawks, PA-C  Chief Complaint:  "I have no life" Chief Complaint    Depression; Psychiatric Evaluation     Visit Diagnosis:    ICD-10-CM   1. MDD (major depressive disorder), recurrent episode, moderate (HCC) F33.1     History of Present Illness:   Kristine [name] is a 37 y.o. year old female with a history of depression, postpartum, who is referred for depression.   She states that she is here for depression.  She believes that it has been getting better after starting sertraline 2 months ago.  She also found out that she is [redacted] weeks pregnant.  She feels better now that she has "new hope." She reports history of depression since 2014 when she was pregnant and her husband let for [employer]. She has been on and off sertraline since then. She talks about her mother in law, who "control" her. She feels that she does not have any right to do anything financially and emotionally. Although she reports that her husband has been supportive, she believes that her husband is also "under control (of her mother in law)."  She believes that her depression got worse several months ago, when she was trying to go to nursing school, taking care of her children and working; she had to study at midnight. She could not continue school as she became pregnant. She talks about an episode in January, when she was very upset and angry that her sister-in-law bought a car. She was very upset about this as her sister does nothing but the patient work all the time, although she still has no money. She impulsively took a bottle of sertraline and tried to take some pills as a suicidal act. Her husband intervened it ans she vomit afterwards. She denies any SI currently.   She endorses insomnia, taking care of her 68-month-old daughter.  She has fair energy and motivation.   She has very poor appetite and lost five pounds over the months. She feels irritable and has crying spells.  She has SIB of hitting her head, or hitting her hand on the table.  She tries to do it to feel calmer; last happened yesterday. She feels anxious, tense and has panic attacks. She denies alcohol use or drug use. She is breastfeeding.    Wt Readings from Last 3 Encounters:  10/01/17 152 lb (68.9 kg)  07/07/17 167 lb (75.8 kg)  11/23/16 167 lb (75.8 kg)    Associated Signs/Symptoms: Depression Symptoms:  depressed mood, insomnia, fatigue, difficulty concentrating, (Hypo) Manic Symptoms:  denies decreased need for sleep, euphoria Anxiety Symptoms:  Excessive Worry, Panic Symptoms, Psychotic Symptoms:  denies AH, VH, paranoia PTSD Symptoms: Negative  Past Psychiatric History:  Outpatient: postpartum depression in 2014 Psychiatry admission: denies Previous suicide attempt: tried to overdose sertraline in January 2019, SIB of hitting her head Past trials of medication: sertraline History of violence: denies  Previous Psychotropic Medications: No   Substance Abuse History in the last 12 months:  No.  Consequences of Substance Abuse: NA  Past Medical History:  Past Medical History:  Diagnosis Date  . Acute blood loss anemia 02/21/2013  . Anemia, iron deficiency 02/21/2013  . Constipation, chronic 12/12/2012  . Medical history non-contributory   . Postpartum care following cesarean delivery (7/11) 02/21/2013  . Previous cesarean delivery, antepartum condition or complication 11/22/2016  . Vitamin D deficiency, unspecified  Past Surgical History:  Procedure Laterality Date  . CESAREAN SECTION N/A 02/20/2013   Procedure: CESAREAN SECTION;  Surgeon: Robley FriesVaishali R Mody, MD;  Location: [facility];  Service: Obstetrics;  Laterality: N/A;  . CESAREAN SECTION N/A 11/22/2016   Procedure: REPEAT CESAREAN SECTION;  Surgeon: Shea EvansVaishali Mody, MD;  [facility];  Service:  Obstetrics;  Laterality: N/A;  EDD: 11/29/16  . NO PAST SURGERIES      Family Psychiatric History:  denies  Family History:  Family History  Problem Relation Age of Onset  . Hypertension Mother   . Cancer Mother        uterus  . Hypertension Paternal Uncle   . Other Neg Hx   . Hearing loss Neg Hx   . Alcohol abuse Neg Hx   . Arthritis Neg Hx   . Asthma Neg Hx   . Birth defects Neg Hx   . COPD Neg Hx   . Depression Neg Hx   . Diabetes Neg Hx   . Drug abuse Neg Hx   . Early death Neg Hx   . Heart disease Neg Hx   . Hyperlipidemia Neg Hx   . Kidney disease Neg Hx   . Learning disabilities Neg Hx   . Mental illness Neg Hx   . Mental retardation Neg Hx   . Miscarriages / Stillbirths Neg Hx   . Stroke Neg Hx   . Vision loss Neg Hx     Social History:   Social History   Socioeconomic History  . Marital status: Married    Spouse name: None  . Number of children: None  . Years of education: None  . Highest education level: None  Social Needs  . Financial resource strain: None  . Food insecurity - worry: None  . Food insecurity - inability: None  . Transportation needs - medical: None  . Transportation needs - non-medical: None  Occupational History  . None  Tobacco Use  . Smoking status: Never Smoker  . Smokeless tobacco: Never Used  Substance and Sexual Activity  . Alcohol use: No  . Drug use: No  . Sexual activity: Yes    Birth control/protection: None  Other Topics Concern  . None  Social History Narrative  . 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  Her parents were Archivistdetective, criminal justice, working in [place]. Her sister is Tourist information centre managerbGyn doctor, living in [place].    Allergies:  No Known Allergies  Metabolic Disorder Labs: No results found for: HGBA1C,  MPG No results found for: PROLACTIN No results found for: CHOL, TRIG, HDL, CHOLHDL, VLDL, LDLCALC   Current Medications: Current Outpatient Medications  Medication Sig Dispense Refill  . sertraline (ZOLOFT) 25 MG tablet Take 25 mg by mouth daily.    Marland Kitchen. acetaminophen (TYLENOL) 325 MG tablet Take by mouth every 6 (six) hours as needed.    . benzonatate (TESSALON) 100 MG capsule Take 1 capsule (100 mg total) by mouth every 8 (eight) hours. 21 capsule 0  . oxyCODONE (OXY IR/ROXICODONE) 5 MG immediate release tablet Take 1 tablet (5 mg total) by mouth every 4 (four) hours as needed (pain scale 4-7). (Patient not taking: Reported on 07/07/2017) 30 tablet 0   No current facility-administered medications for this visit.     Neurologic: Headache: No Seizure: No  Paresthesias:No  Musculoskeletal: Strength & Muscle Tone: within normal limits Gait & Station: normal Patient leans: N/A  Psychiatric Specialty Exam: Review of Systems  Psychiatric/Behavioral: Positive for depression. Negative for hallucinations, memory loss, substance abuse and suicidal ideas. The patient is nervous/anxious and has insomnia.   All other systems reviewed and are negative.   Blood pressure 102/68, pulse 73, height 5\' 2"  (1.575 m), weight 152 lb (68.9 kg), SpO2 98 %, unknown if currently breastfeeding.Body mass index is 27.8 kg/m.  General Appearance: Fairly Groomed  Eye Contact:  Good  Speech:  Clear and Coherent  Volume:  Normal  Mood:  Anxious and Depressed  Affect:  Appropriate, Congruent, Tearful and down at times  Thought Process:  Coherent and Goal Directed  Orientation:  Full (Time, Place, and Person)  Thought Content:  Logical  Suicidal Thoughts:  No  Homicidal Thoughts:  No  Memory:  Immediate;   Good Recent;   Good Remote;   Good  Judgement:  Good  Insight:  Fair  Psychomotor Activity:  Normal  Concentration:  Concentration: Good and Attention Span: Good  Recall:  Good  Fund of Knowledge:Good   Language: Good  Akathisia:  No  Handed:  Right  AIMS (if indicated):  N/A  Assets:  Communication Skills Desire for Improvement  ADL's:  Intact  Cognition: WNL  Sleep:  poor  TSH 0.896- 08/2017  Assessment Audreena Joice Lofts "Danella Sensing" is a 37 y.o. year old pregnant lady with a history of depression, postpartum, who is referred for depression.   # MDD, peripartum onset, without psychotic features Patient endorses neurovegetative symptoms, which has been slightly improving since starting sertraline. Psychosocial stressors include discordance with her mother in law at home, taking care of her children and she is [redacted] weeks pregnant.  Although she will benefit from up titration of sertraline to target residual mood symptoms,  she is not amenable to this option.  Will continue current dose of sertraline to target depression. Discussed risk of using SSRI during pregnancy, which includes but not limited to teratogenesis, spontaneous abortion, cardiovascular malformation, preterm birth and birth weight, persistent pulmonary hypertension in newborns, poor neonatal adaptation syndrome, autism. Given benefits of treating underlying mood symptoms outweighs risks, will continue this medication. Discussed behavioral activation.  Discussed self compassion.  She will greatly benefit from CBT; referral is made.   Plan 1. Continue sertraline 25 mg daily  2. Referral to therapy  3. Return to clinic in one month for 30 mins  The patient demonstrates the following risk factors for suicide: Chronic risk factors for suicide include: psychiatric disorder of depression. Acute risk factors for suicide include: family or marital conflict. Protective factors for this patient include: responsibility to others (children, family), coping skills and hope for the future. Considering these factors, the overall suicide risk at this point appears to be low. Patient is appropriate for outpatient follow up.   Treatment Plan  Summary: Plan as above   Neysa Hotter, MD 2/19/20193:35 PM

## 2017-10-01 ENCOUNTER — Ambulatory Visit (HOSPITAL_COMMUNITY): Admitting: Psychiatry

## 2017-10-01 ENCOUNTER — Ambulatory Visit (INDEPENDENT_AMBULATORY_CARE_PROVIDER_SITE_OTHER): Admitting: Psychiatry

## 2017-10-01 ENCOUNTER — Encounter (INDEPENDENT_AMBULATORY_CARE_PROVIDER_SITE_OTHER): Payer: Self-pay

## 2017-10-01 ENCOUNTER — Encounter (HOSPITAL_COMMUNITY): Payer: Self-pay | Admitting: Psychiatry

## 2017-10-01 VITALS — BP 102/68 | HR 73 | Ht 62.0 in | Wt 152.0 lb

## 2017-10-01 DIAGNOSIS — O99341 Other mental disorders complicating pregnancy, first trimester: Secondary | ICD-10-CM | POA: Diagnosis not present

## 2017-10-01 DIAGNOSIS — Z3A12 12 weeks gestation of pregnancy: Secondary | ICD-10-CM

## 2017-10-01 DIAGNOSIS — F410 Panic disorder [episodic paroxysmal anxiety] without agoraphobia: Secondary | ICD-10-CM | POA: Diagnosis not present

## 2017-10-01 DIAGNOSIS — F32A Depression, unspecified: Secondary | ICD-10-CM | POA: Insufficient documentation

## 2017-10-01 DIAGNOSIS — F419 Anxiety disorder, unspecified: Secondary | ICD-10-CM

## 2017-10-01 DIAGNOSIS — R450 Nervousness: Secondary | ICD-10-CM | POA: Diagnosis not present

## 2017-10-01 DIAGNOSIS — F329 Major depressive disorder, single episode, unspecified: Secondary | ICD-10-CM | POA: Insufficient documentation

## 2017-10-01 DIAGNOSIS — F331 Major depressive disorder, recurrent, moderate: Secondary | ICD-10-CM | POA: Diagnosis not present

## 2017-10-01 DIAGNOSIS — G4700 Insomnia, unspecified: Secondary | ICD-10-CM | POA: Diagnosis not present

## 2017-10-01 NOTE — Patient Instructions (Signed)
1. Continue sertraline 25 mg daily  2. Referral to therapy  3. Return to clinic in one month for 30 mins

## 2017-10-07 ENCOUNTER — Inpatient Hospital Stay (HOSPITAL_COMMUNITY)
Admission: AD | Admit: 2017-10-07 | Discharge: 2017-10-08 | Disposition: A | Discharge status: Home / Self Care | Source: Ambulatory Visit | Attending: Obstetrics | Admitting: Obstetrics

## 2017-10-07 ENCOUNTER — Other Ambulatory Visit: Payer: Self-pay

## 2017-10-07 ENCOUNTER — Encounter (HOSPITAL_COMMUNITY): Payer: Self-pay | Admitting: *Deleted

## 2017-10-07 ENCOUNTER — Inpatient Hospital Stay (HOSPITAL_COMMUNITY)

## 2017-10-07 DIAGNOSIS — O26892 Other specified pregnancy related conditions, second trimester: Secondary | ICD-10-CM | POA: Insufficient documentation

## 2017-10-07 DIAGNOSIS — Z3A12 12 weeks gestation of pregnancy: Secondary | ICD-10-CM | POA: Insufficient documentation

## 2017-10-07 DIAGNOSIS — R109 Unspecified abdominal pain: Secondary | ICD-10-CM | POA: Diagnosis not present

## 2017-10-07 DIAGNOSIS — S3991XA Unspecified injury of abdomen, initial encounter: Secondary | ICD-10-CM

## 2017-10-07 DIAGNOSIS — O26891 Other specified pregnancy related conditions, first trimester: Secondary | ICD-10-CM | POA: Diagnosis not present

## 2017-10-07 MED ORDER — ACETAMINOPHEN 500 MG PO TABS
1000.0000 mg | ORAL_TABLET | Freq: Once | ORAL | Status: AC
  Administered 2017-10-07: 1000 mg via ORAL
  Filled 2017-10-07: qty 2
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
## 2017-10-07 NOTE — Discharge Instructions (Signed)

## 2017-10-08 LAB — URINALYSIS, ROUTINE W REFLEX MICROSCOPIC
Bilirubin Urine: NEGATIVE
GLUCOSE, UA: NEGATIVE mg/dL
Ketones, ur: NEGATIVE mg/dL
Nitrite: NEGATIVE
Protein, ur: NEGATIVE mg/dL
Specific Gravity, Urine: 1.006 (ref 1.005–1.030)
pH: 6 (ref 5.0–8.0)

## 2017-10-28 NOTE — Progress Notes (Signed)
BH MD/PA/NP OP Progress Note  10/30/2017 11:06 AM Kristine [name]  MRN:  [id number]  Chief Complaint:  Chief Complaint    Follow-up; Depression     HPI:  Patient presents for follow up appointment for depression.  She states that she has been doing better since the last appointment.  She occasionally forgets to take sertraline a few times.  She is not so much bothered by her mother-in-law; although she may feel stressed at times, she tries to walk away.  She states that nobody can control other people.  She agrees that she has being able to handle situations better compared to the initial visit despite ongoing stress.  She reports good bonding with her daughter, 9211 months-year-old and another child.  She is hoping to get outside with her daughter when the weather is getting warmer.  She has decreased appetite.  She has hypersomnia.  She feels fatigued.  She is more motivated and has more energy.  She denies SI, HI, AH, VH. She has had worsening headache, diplopia and dizziness; has not contacted with her Ob/Gyn.  Wt Readings from Last 3 Encounters:  10/30/17 153 lb (69.4 kg)  10/01/17 152 lb (68.9 kg)  07/07/17 167 lb (75.8 kg)    Visit Diagnosis:    ICD-10-CM   1. MDD (major depressive disorder), recurrent episode, moderate (HCC) F33.1     Past Psychiatric History:  I have reviewed the patient's psychiatry history in detail and updated the patient record. Outpatient: postpartum depression in 2014 Psychiatry admission: denies Previous suicide attempt: tried to overdose sertraline in January 2019, SIB of hitting her head Past trials of medication: sertraline History of violence: denies   Past Medical History:  Past Medical History:  Diagnosis Date  . Acute blood loss anemia 02/21/2013  . Anemia, iron deficiency 02/21/2013  . Constipation, chronic 12/12/2012  . Medical history non-contributory   . Postpartum care following cesarean delivery (7/11) 02/21/2013  . Previous cesarean  delivery, antepartum condition or complication 11/22/2016  . Vitamin D deficiency, unspecified     Past Surgical History:  Procedure Laterality Date  . CESAREAN SECTION N/A 02/20/2013   Procedure: CESAREAN SECTION;  Surgeon: Robley FriesVaishali R Mody, MD;  Location: [facility];  Service: Obstetrics;  Laterality: N/A;  . CESAREAN SECTION N/A 11/22/2016   Procedure: REPEAT CESAREAN SECTION;  Surgeon: Shea EvansVaishali Mody, MD;  [facility];  Service: Obstetrics;  Laterality: N/A;  EDD: 11/29/16  . NO PAST SURGERIES      Family Psychiatric History: I have reviewed the patient's family history in detail and updated the patient record.  Family History:  Family History  Problem Relation Age of Onset  . Hypertension Mother   . Cancer Mother        uterus  . Hypertension Paternal Uncle   . Other Neg Hx   . Hearing loss Neg Hx   . Alcohol abuse Neg Hx   . Arthritis Neg Hx   . Asthma Neg Hx   . Birth defects Neg Hx   . COPD Neg Hx   . Depression Neg Hx   . Diabetes Neg Hx   . Drug abuse Neg Hx   . Early death Neg Hx   . Heart disease Neg Hx   . Hyperlipidemia Neg Hx   . Kidney disease Neg Hx   . Learning disabilities Neg Hx   . Mental illness Neg Hx   . Mental retardation Neg Hx   . Miscarriages / Stillbirths Neg Hx   .  Stroke Neg Hx   . Vision loss Neg Hx     Social History:  Social History   Socioeconomic History  . Marital status: Married    Spouse name: None  . Number of children: None  . Years of education: None  . Highest education level: None  Social Needs  . Financial resource strain: None  . Food insecurity - worry: None  . Food insecurity - inability: None  . Transportation needs - medical: None  . Transportation needs - non-medical: None  Occupational History  . None  Tobacco Use  . Smoking status: Never Smoker  . Smokeless tobacco: Never Used  Substance and Sexual Activity  . Alcohol use: No  . Drug use: No  . Sexual activity: Yes    Birth  control/protection: None  Other Topics Concern  . None  Social History Narrative  . None   Work: Therapist, art for [employer],  Education: college, bachelor degree in Lobbyist, dropped Child psychotherapist degree due to financial issues She grew up in [place], "friendly family" with each other.  Her parents were Archivist, criminal justice, working in [place]. Her sister is Tourist information centre manager, living in [place].    Allergies: No Known Allergies  Metabolic Disorder Labs: No results found for: HGBA1C, MPG No results found for: PROLACTIN No results found for: CHOL, TRIG, HDL, CHOLHDL, VLDL, LDLCALC No results found for: TSH  Therapeutic Level Labs: No results found for: LITHIUM No results found for: VALPROATE No components found for:  CBMZ  Current Medications: Current Outpatient Medications  Medication Sig Dispense Refill  . acetaminophen (TYLENOL) 325 MG tablet Take by mouth every 6 (six) hours as needed.    . benzonatate (TESSALON) 100 MG capsule Take 1 capsule (100 mg total) by mouth every 8 (eight) hours. 21 capsule 0  . sertraline (ZOLOFT) 25 MG tablet Take 25 mg by mouth daily.     No current facility-administered medications for this visit.      Musculoskeletal: Strength & Muscle Tone: within normal limits Gait & Station: normal Patient leans: N/A  Psychiatric Specialty Exam: Review of Systems  Psychiatric/Behavioral: Positive for depression. Negative for hallucinations, memory loss, substance abuse and suicidal ideas. The patient is not nervous/anxious and does not have insomnia.   All other systems reviewed and are negative.   Blood pressure 106/69, pulse 91, height 5\' 2"  (1.575 m), weight 153 lb (69.4 kg), last menstrual period 06/08/2017, SpO2 98 %, unknown if currently breastfeeding.Body mass index is 27.98 kg/m.  General Appearance: Fairly Groomed  Eye Contact:  Good  Speech:  Clear and Coherent  Volume:  Normal  Mood:  "better"  Affect:  Appropriate, Congruent  and reactive, euthymic  Thought Process:  Coherent and Goal Directed  Orientation:  Full (Time, Place, and Person)  Thought Content: Logical   Suicidal Thoughts:  No  Homicidal Thoughts:  No  Memory:  Immediate;   Good Recent;   Good Remote;   Good  Judgement:  Good  Insight:  Fair  Psychomotor Activity:  Normal  Concentration:  Concentration: Good and Attention Span: Good  Recall:  Good  Fund of Knowledge: Good  Language: Good  Akathisia:  No  Handed:  Right  AIMS (if indicated): not done  Assets:  Communication Skills Desire for Improvement  ADL's:  Intact  Cognition: WNL  Sleep:  Good   Screenings:   Assessment and Plan:  Kristine [name] is a 37 y.o. 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  Psychosocial stressors including discordance with her mother-in-law at home, taking care of her children and she is [redacted] weeks pregnant.  Will continue current dose of sertraline to target depression.  Provided psycho education of the potential need of up titration in the future due to physical change in pregnancy.  Discussed risk of using SSRI during pregnancy, which includes but not limited to teratogenesis, spontaneous abortion.  It is considered that benefits outweigh risk of using medication given severity of her symptoms which can impact her self care. Discussed self compassion. She is scheduled to see ms. Peggy for therapy.  # Dizziness She is advised to contact her Ob/Gyn regarding her dizziness, diplopia.   Plan I have reviewed and updated plans as below 1. Continue sertraline 25 mg daily  2. Referral to therapy  3. Return to clinic five weeks for 30 mins  The patient demonstrates the following risk factors  for suicide: Chronic risk factors for suicide include: psychiatric disorder of depression. Acute risk factors for suicide include: family or marital conflict. Protective factors for this patient include: responsibility to others (children, family), coping skills and hope for the future. Considering these factors, the overall suicide risk at this point appears to be low. Patient is appropriate for outpatient follow up.  The duration of this appointment visit was 30 minutes of face-to-face time with the patient.  Greater than 50% of this time was spent in counseling, explanation of  diagnosis, planning of further management, and coordination of care.   Neysa Hotter, MD 10/30/2017, 11:06 AM

## 2017-10-30 ENCOUNTER — Ambulatory Visit (INDEPENDENT_AMBULATORY_CARE_PROVIDER_SITE_OTHER): Admitting: Psychiatry

## 2017-10-30 ENCOUNTER — Encounter (HOSPITAL_COMMUNITY): Payer: Self-pay | Admitting: Psychiatry

## 2017-10-30 VITALS — BP 106/69 | HR 91 | Ht 62.0 in | Wt 153.0 lb

## 2017-10-30 DIAGNOSIS — G4710 Hypersomnia, unspecified: Secondary | ICD-10-CM | POA: Diagnosis not present

## 2017-10-30 DIAGNOSIS — R42 Dizziness and giddiness: Secondary | ICD-10-CM | POA: Diagnosis not present

## 2017-10-30 DIAGNOSIS — F331 Major depressive disorder, recurrent, moderate: Secondary | ICD-10-CM

## 2017-10-30 NOTE — Patient Instructions (Addendum)
1. Continue sertraline 25 mg daily  2. Return to clinic five weeks for 30 mins

## 2017-11-06 ENCOUNTER — Ambulatory Visit (HOSPITAL_COMMUNITY): Admitting: Psychiatry

## 2017-12-03 NOTE — Progress Notes (Deleted)
BH MD/PA/NP OP Progress Note  12/03/2017 4:41 PM Kristine [name]  MRN:  [id number]  Chief Complaint:  HPI: *** Visit Diagnosis: No diagnosis found.  Past Psychiatric History:  I have reviewed the patient's psychiatry history in detail and updated the patient record. Outpatient:postpartum depression in 2014 Psychiatry admission:denies Previous suicide attempt:tried to overdose sertraline in January 2019, SIB of hitting her head Past trials of medication:sertraline History of violence:denies   Past Medical History:  Past Medical History:  Diagnosis Date  . Acute blood loss anemia 02/21/2013  . Anemia, iron deficiency 02/21/2013  . Constipation, chronic 12/12/2012  . Medical history non-contributory   . Postpartum care following cesarean delivery (7/11) 02/21/2013  . Previous cesarean delivery, antepartum condition or complication 11/22/2016  . Vitamin D deficiency, unspecified     Past Surgical History:  Procedure Laterality Date  . CESAREAN SECTION N/A 02/20/2013   Procedure: CESAREAN SECTION;  Surgeon: Robley Fries, MD;  Location: [facility];  Service: Obstetrics;  Laterality: N/A;  . CESAREAN SECTION N/A 11/22/2016   Procedure: REPEAT CESAREAN SECTION;  Surgeon: Shea Evans, MD;  Location: [facility];  Service: Obstetrics;  Laterality: N/A;  EDD: 11/29/16  . NO PAST SURGERIES      Family Psychiatric History: I have reviewed the patient's family history in detail and updated the patient record.  Family History:  Family History  Problem Relation Age of Onset  . Hypertension Mother   . Cancer Mother        uterus  . Hypertension Paternal Uncle   . Other Neg Hx   . Hearing loss Neg Hx   . Alcohol abuse Neg Hx   . Arthritis Neg Hx   . Asthma Neg Hx   . Birth defects Neg Hx   . COPD Neg Hx   . Depression Neg Hx   . Diabetes Neg Hx   . Drug abuse Neg Hx   . Early death Neg Hx   . Heart disease Neg Hx   . Hyperlipidemia Neg Hx   . Kidney disease Neg Hx   .  Learning disabilities Neg Hx   . Mental illness Neg Hx   . Mental retardation Neg Hx   . Miscarriages / Stillbirths Neg Hx   . Stroke Neg Hx   . Vision loss Neg Hx     Social History:  Social History   Socioeconomic History  . Marital status: Married    Spouse name: Not on file  . Number of children: Not on file  . Years of education: Not on file  . Highest education level: Not on file  Occupational History  . Not on file  Social Needs  . Financial resource strain: Not on file  . Food insecurity:    Worry: Not on file    Inability: Not on file  . Transportation needs:    Medical: Not on file    Non-medical: Not on file  Tobacco Use  . Smoking status: Never Smoker  . Smokeless tobacco: Never Used  Substance and Sexual Activity  . Alcohol use: No  . Drug use: No  . Sexual activity: Yes    Birth control/protection: None  Lifestyle  . Physical activity:    Days per week: Not on file    Minutes per session: Not on file  . Stress: Not on file  Relationships  . Social connections:    Talks on phone: Not on file    Gets together: Not on file    Attends  religious service: Not on file    Active member of club or organization: Not on file    Attends meetings of clubs or organizations: Not on file    Relationship status: Not on file  Other Topics Concern  . Not on file  Social History Narrative  . Not on file    Allergies: No Known Allergies  Metabolic Disorder Labs: No results found for: HGBA1C, MPG No results found for: PROLACTIN No results found for: CHOL, TRIG, HDL, CHOLHDL, VLDL, LDLCALC No results found for: TSH  Therapeutic Level Labs: No results found for: LITHIUM No results found for: VALPROATE No components found for:  CBMZ  Current Medications: Current Outpatient Medications  Medication Sig Dispense Refill  . acetaminophen (TYLENOL) 325 MG tablet Take by mouth every 6 (six) hours as needed.    . benzonatate (TESSALON) 100 MG capsule Take 1 capsule  (100 mg total) by mouth every 8 (eight) hours. 21 capsule 0  . sertraline (ZOLOFT) 25 MG tablet Take 25 mg by mouth daily.     No current facility-administered medications for this visit.      Musculoskeletal: Strength & Muscle Tone: within normal limits Gait & Station: normal Patient leans: N/A  Psychiatric Specialty Exam: ROS  Last menstrual period 06/08/2017, unknown if currently breastfeeding.There is no height or weight on file to calculate BMI.  General Appearance: Fairly Groomed  Eye Contact:  Good  Speech:  Clear and Coherent  Volume:  Normal  Mood:  {BHH MOOD:22306}  Affect:  {Affect (PAA):22687}  Thought Process:  Coherent  Orientation:  Full (Time, Place, and Person)  Thought Content: Logical   Suicidal Thoughts:  {ST/HT (PAA):22692}  Homicidal Thoughts:  {ST/HT (PAA):22692}  Memory:  Immediate;   Good  Judgement:  {Judgement (PAA):22694}  Insight:  {Insight (PAA):22695}  Psychomotor Activity:  Normal  Concentration:  Concentration: Good and Attention Span: Good  Recall:  Good  Fund of Knowledge: Good  Language: Good  Akathisia:  No  Handed:  Right  AIMS (if indicated): not done  Assets:  Communication Skills Desire for Improvement  ADL's:  Intact  Cognition: WNL  Sleep:  {BHH GOOD/FAIR/POOR:22877}   Screenings:   Assessment and Plan:  Kristine [name] is a 37 y.o. year old female with a history of depression,  , who presents for follow up appointment for No diagnosis found.  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  Psychosocial stressors including discordance with her mother-in-law at home, taking care of her children and she is [redacted] weeks pregnant.  Will continue current dose of sertraline to target depression.  Provided psycho education of the potential need of up titration in the future due to physical change in pregnancy.  Discussed risk of using SSRI during  pregnancy, which includes but not limited to teratogenesis, spontaneous abortion.  It is considered that benefits outweigh risk of using medication given severity of her symptoms which can impact her self care. Discussed self compassion. She is scheduled to see ms. Peggy for therapy.  # Dizziness She is advised to contact her Ob/Gyn regarding her dizziness, diplopia.   Plan  1. Continue sertraline 25 mg daily  2. Referral to therapy  3. Return to clinic five weeks for 30 mins  The patient demonstrates the following risk factors for suicide: Chronic risk factors for suicide include:psychiatric disorder ofdepression. Acute risk factorsfor suicide include: family or marital conflict. Protective factorsfor this patient include: responsibility to  others (children, family), coping skills and hope for the future. Considering these factors, the overall suicide risk at this point appears to below. Patientisappropriate for outpatient follow up.    Neysa Hotter, MD 12/03/2017, 4:41 PM

## 2017-12-05 ENCOUNTER — Ambulatory Visit (HOSPITAL_COMMUNITY): Admitting: Psychiatry

## 2017-12-30 ENCOUNTER — Other Ambulatory Visit: Payer: Self-pay

## 2018-01-01 ENCOUNTER — Encounter (HOSPITAL_COMMUNITY): Payer: Self-pay

## 2018-01-03 ENCOUNTER — Encounter (HOSPITAL_COMMUNITY): Payer: Self-pay

## 2018-01-03 ENCOUNTER — Ambulatory Visit (HOSPITAL_COMMUNITY)
Admission: RE | Admit: 2018-01-03 | Discharge: 2018-01-03 | Disposition: A | Discharge status: Home / Self Care | Source: Ambulatory Visit | Attending: Obstetrics & Gynecology | Admitting: Obstetrics & Gynecology

## 2018-01-03 DIAGNOSIS — O26892 Other specified pregnancy related conditions, second trimester: Secondary | ICD-10-CM | POA: Diagnosis not present

## 2018-01-03 DIAGNOSIS — E7870 Disorder of bile acid and cholesterol metabolism, unspecified: Secondary | ICD-10-CM | POA: Diagnosis not present

## 2018-01-03 DIAGNOSIS — Z3A25 25 weeks gestation of pregnancy: Secondary | ICD-10-CM | POA: Insufficient documentation

## 2018-01-03 HISTORY — DX: Depression, unspecified: F32.A

## 2018-01-03 HISTORY — DX: Major depressive disorder, single episode, unspecified: F32.9

## 2018-01-03 NOTE — Consult Note (Signed)
Maternal Fetal Medicine Consultation  I had the pleasure of seeing your patient Kristine [name] for Maternal-Fetal Medicine consultation on 01/03/2018. As you know, Kristine [name] is a 37 y.o. G3P2002 at [redacted]w[redacted]d who presents for consultation regarding elevated bile acids.  Kristine [name] reports the onset of itching of her palms and soles last week. A laboratory evaluation showed elevated bile acids of 12.5 with normal liver function tests. She continues to have itching of her palms and soles and now her lower legs. She denies any rashes or new skin findings. Her palms appear erythematous but without excoriations or skin lesions. She has never experienced this in prior pregnancies or outside of pregnancy.  This pregnancy has otherwise been notable for the finding of a low fetal heart rate (Kristine [name] reports 115-118) at this time of ultrasound for which a fetal echocardiogram was performed. Kristine [name] tells me the fetal echo was normal. I do not have these records for review.  Kristine [name]'s obstetric history is notable for two term cesarean deliveries, the first for non-reassuring fetal tracing and the second a scheduled repeat. These pregnancies were otherwise uncomplicated.  Kristine [name] feels well today. She denies contractions, loss of fluid, or vaginal bleeding. Fetal movement is at baseline.   The remainder of Kristine [name]'s medical and surgical history is unremarkable. She takes prenatal vitamins and vitamin D and is allergic to no medications. Kristine [name] denies alcohol, tobacco or other drug use. Her family history is noncontributory.  We discussed the following issues during her visit today:  Intrahepatic cholestasis of pregnancy (ICP): I discussed with Kristine [name] and her husband that her symptoms together with elevated bile acids are consistent with a diagnosis of ICP. While ICP is often diagnosed later in the third trimester, it can be seen as early as the second and early third trimesters and is characterized by pruritus and elevated  bile acids. Kristine [name] understands that the etiology of ICP is unclear. Maternal outcomes are typically excellent, as it is not generally associated with long-term hepatobiliary issues. I would recommend ordering a hepatitis C screen with Kristine [name]'s next blood draw (although she has few risk factors for hepatitis). ICP is however associated with an increased risk of poor fetal outcome including stillbirth, preterm birth, in utero passage of meconium, and an increased risk of RDS. The incidence of stillbirth attributed to ICP is about 1.2% after 37 weeks. The majority of stillbirths occur after 38 weeks and are unpredictable. The etiology of stillbirth is unclear, but it is known that bile acids cross the placenta and higher serum total bile acid concentrations (>/= 40 micromol/L) are associated with worse fetal outcomes. Treatment with ursodial is often warranted for both maternal symptom relief and reduction of rates of fetal complications. I recommended starting ursodiol  three times daily. About 40% of women will have resolution of pruritis with ursodiol treatment and at least 60% receive some symptomatic benefit. I typically recommend following patients clinically, but given the early presentation of ICP I recommend repeating bile acid levels in several weeks to assess for disease progession. I also recommend antenatal surveillance with twice weekly NSTs beginning at 32 weeks. However, I did discuss with Kristine [name] that antenatal testing has not been shown to be predictive of stillbirth in the setting of ICP. I recommend early term delivery at 37 weeks without amniocentesis. ICP is not a contraindication to breastfeeding.   Recommendations: 1. Begin ursodiol  TID, may be increased as needed if symptoms unimproved in 1-2 weeks 2. Repeat bile acids and LFTs in 2-3 weeks, consider return  consultation if bile acids >/= 40 micromol/L 3. Hepatitis C screening 4. Consider antenatal testing at 32 weeks 5.  Delivery by 37 weeks, sooner as indicated  Thank you for the opportunity to be a part of the care of Kristine [name]. Please contact our office if we can be of further assistance.   I spent approximately 40 minutes with this patient with over 50% of time spent in face-to-face counseling.  Darlyn Read, MD Maternal-Fetal Medicine

## 2018-01-24 ENCOUNTER — Other Ambulatory Visit (HOSPITAL_COMMUNITY): Payer: Self-pay | Admitting: Obstetrics & Gynecology

## 2018-01-24 DIAGNOSIS — O26643 Intrahepatic cholestasis of pregnancy, third trimester: Secondary | ICD-10-CM

## 2018-01-24 DIAGNOSIS — O26613 Liver and biliary tract disorders in pregnancy, third trimester: Secondary | ICD-10-CM

## 2018-01-24 DIAGNOSIS — K831 Obstruction of bile duct: Secondary | ICD-10-CM

## 2018-01-24 DIAGNOSIS — Z3A29 29 weeks gestation of pregnancy: Secondary | ICD-10-CM

## 2018-01-24 DIAGNOSIS — O283 Abnormal ultrasonic finding on antenatal screening of mother: Secondary | ICD-10-CM

## 2018-01-28 ENCOUNTER — Encounter (HOSPITAL_COMMUNITY): Payer: Self-pay | Admitting: Obstetrics & Gynecology

## 2018-01-28 ENCOUNTER — Inpatient Hospital Stay (HOSPITAL_COMMUNITY)
Admission: AD | Admit: 2018-01-28 | Discharge: 2018-01-28 | Disposition: A | Discharge status: Home / Self Care | Source: Ambulatory Visit | Attending: Obstetrics & Gynecology | Admitting: Obstetrics & Gynecology

## 2018-01-28 DIAGNOSIS — O09523 Supervision of elderly multigravida, third trimester: Secondary | ICD-10-CM | POA: Diagnosis not present

## 2018-01-28 DIAGNOSIS — O26642 Intrahepatic cholestasis of pregnancy, second trimester: Secondary | ICD-10-CM

## 2018-01-28 DIAGNOSIS — J329 Chronic sinusitis, unspecified: Secondary | ICD-10-CM | POA: Diagnosis not present

## 2018-01-28 DIAGNOSIS — Z3A28 28 weeks gestation of pregnancy: Secondary | ICD-10-CM

## 2018-01-28 DIAGNOSIS — R51 Headache: Secondary | ICD-10-CM | POA: Diagnosis present

## 2018-01-28 DIAGNOSIS — Z8249 Family history of ischemic heart disease and other diseases of the circulatory system: Secondary | ICD-10-CM | POA: Insufficient documentation

## 2018-01-28 DIAGNOSIS — O99513 Diseases of the respiratory system complicating pregnancy, third trimester: Secondary | ICD-10-CM | POA: Insufficient documentation

## 2018-01-28 DIAGNOSIS — O34219 Maternal care for unspecified type scar from previous cesarean delivery: Secondary | ICD-10-CM | POA: Diagnosis not present

## 2018-01-28 DIAGNOSIS — K831 Obstruction of bile duct: Secondary | ICD-10-CM

## 2018-01-28 DIAGNOSIS — O26612 Liver and biliary tract disorders in pregnancy, second trimester: Secondary | ICD-10-CM

## 2018-01-28 DIAGNOSIS — Z8049 Family history of malignant neoplasm of other genital organs: Secondary | ICD-10-CM | POA: Diagnosis not present

## 2018-01-28 DIAGNOSIS — O9989 Other specified diseases and conditions complicating pregnancy, childbirth and the puerperium: Secondary | ICD-10-CM | POA: Diagnosis not present

## 2018-01-28 HISTORY — DX: Obstruction of bile duct: K83.1

## 2018-01-28 HISTORY — DX: Intrahepatic cholestasis of pregnancy, second trimester: O26.642

## 2018-01-28 HISTORY — DX: Obstruction of bile duct: O26.612

## 2018-01-28 HISTORY — DX: Liver and biliary tract disorders in pregnancy, second trimester: O26.612

## 2018-01-28 LAB — URINALYSIS, ROUTINE W REFLEX MICROSCOPIC
Bilirubin Urine: NEGATIVE
GLUCOSE, UA: NEGATIVE mg/dL
KETONES UR: NEGATIVE mg/dL
Nitrite: NEGATIVE
PH: 7 (ref 5.0–8.0)
PROTEIN: NEGATIVE mg/dL
Specific Gravity, Urine: 1.005 (ref 1.005–1.030)

## 2018-01-28 MED ORDER — AMOXICILLIN-POT CLAVULANATE 875-125 MG PO TABS: 1.0000 | ORAL_TABLET | Freq: Two times a day (BID) | ORAL | 0 refills | Status: AC

## 2018-01-28 NOTE — Discharge Instructions (Signed)

## 2018-01-28 NOTE — MAU Note (Signed)
Pt reports headache since yesterday, a lot of pressure in her face. Seen in office today for NST which was reactive.

## 2018-01-28 NOTE — MAU Provider Note (Signed)
History     CSN: [id number]  Arrival date and time: 01/28/18 1732   First Provider Initiated Contact with Patient 01/28/18 2002      Chief Complaint  Patient presents with  . Headache   HPI Kristine [name] is a 37 y.o. G3P2002 at [id number] who presents with headache. Has 2 weeks history of URI with cough & congestion. Was given course of steroids last week which she completed. Cough has continued. Since yesterday has had headache. Headache in frontal area, bilateral cheeks, teeth, and jaw. Rates pain 7/10. Has been taking 1 ES tylenol every 4 hours since yesterday without relief. Pain worse when she leans over. Nothing makes pain better. Denies hx of migraines or head trauma. Pain does not radiate.  Denies fever/chills, sore throat, ear pain, n/v, abdominal pain, VB, or LOF. Positive fetal movement.   OB History    Gravida  3   Para  2   Term  2   Preterm      AB      Living  2     SAB      TAB      Ectopic      Multiple  0   Live Births  2           Past Medical History:  Diagnosis Date  . Acute blood loss anemia 02/21/2013  . Anemia, iron deficiency 02/21/2013  . Cholestasis during pregnancy in second trimester 01/28/2018  . Constipation, chronic 12/12/2012  . Depression   . Previous cesarean delivery, antepartum condition or complication 11/22/2016  . Vitamin D deficiency, unspecified     Past Surgical History:  Procedure Laterality Date  . CESAREAN SECTION N/A 02/20/2013   Procedure: CESAREAN SECTION;  Surgeon: Robley FriesVaishali R Mody, MD;  Location: [facility];  Service: Obstetrics;  Laterality: N/A;  . CESAREAN SECTION N/A 11/22/2016   Procedure: REPEAT CESAREAN SECTION;  Surgeon: Shea EvansVaishali Mody, MD;  [facility];  Service: Obstetrics;  Laterality: N/A;  EDD: 11/29/16    Family History  Problem Relation Age of Onset  . Hypertension Mother   . Cancer Mother        uterus  . Hypertension Paternal Uncle   . Other Neg Hx   . Hearing loss Neg Hx   .  Alcohol abuse Neg Hx   . Arthritis Neg Hx   . Asthma Neg Hx   . Birth defects Neg Hx   . COPD Neg Hx   . Depression Neg Hx   . Diabetes Neg Hx   . Drug abuse Neg Hx   . Early death Neg Hx   . Heart disease Neg Hx   . Hyperlipidemia Neg Hx   . Kidney disease Neg Hx   . Learning disabilities Neg Hx   . Mental illness Neg Hx   . Mental retardation Neg Hx   . Miscarriages / Stillbirths Neg Hx   . Stroke Neg Hx   . Vision loss Neg Hx     Social History   Tobacco Use  . Smoking status: Never Smoker  . Smokeless tobacco: Never Used  Substance Use Topics  . Alcohol use: No  . Drug use: No    Allergies: No Known Allergies  Medications Prior to Admission  Medication Sig Dispense Refill Last Dose  . acetaminophen (TYLENOL) 325 MG tablet Take by mouth every 6 (six) hours as needed.   Taking  . benzonatate (TESSALON) 100 MG capsule Take 1 capsule (100 mg total) by mouth  every 8 (eight) hours. (Patient not taking: Reported on 01/03/2018) 21 capsule 0 Not Taking  . Cholecalciferol (VITAMIN D PO) Take by mouth.   Taking  . Prenatal Multivit-Min-Fe-FA (PRENATAL VITAMINS PO) Take by mouth.   Taking  . sertraline (ZOLOFT) 25 MG tablet Take 25 mg by mouth daily.   Not Taking    Review of Systems  Constitutional: Negative.   HENT: Positive for congestion, sinus pressure and sinus pain. Negative for ear pain and sore throat.   Eyes: Negative for photophobia and visual disturbance.  Respiratory: Positive for cough. Negative for shortness of breath.   Gastrointestinal: Negative.   Genitourinary: Negative.   Neurological: Positive for headaches.   Physical Exam   Blood pressure 111/68, pulse 77, temperature 98.3 F (36.8 C), resp. rate 17, height 5\' 3"  (1.6 m), weight 162 lb (73.5 kg), last menstrual period 06/08/2017, SpO2 97 %, unknown if currently breastfeeding.  Physical Exam  Nursing note and vitals reviewed. Constitutional: She is oriented to person, place, and time. She appears  well-developed and well-nourished. No distress.  HENT:  Head: Normocephalic and atraumatic.  Nose: Right sinus exhibits maxillary sinus tenderness and frontal sinus tenderness. Left sinus exhibits maxillary sinus tenderness and frontal sinus tenderness.  Eyes: Conjunctivae are normal. Right eye exhibits no discharge. Left eye exhibits no discharge. No scleral icterus.  Neck: Normal range of motion.  Cardiovascular: Normal rate, regular rhythm and normal heart sounds.  No murmur heard. Respiratory: Effort normal. No tachypnea. No respiratory distress. She has no wheezes. She has rhonchi (resolved with cough) in the left lower field.  GI: Soft.  Neurological: She is alert and oriented to person, place, and time.  Skin: Skin is warm and dry. She is not diaphoretic.  Psychiatric: She has a normal mood and affect. Her behavior is normal. Judgment and thought content normal.    MAU Course  Procedures Results for orders placed or performed during the hospital encounter of 01/28/18 (from the past 24 hour(s))  Urinalysis, Routine w reflex microscopic     Status: Abnormal   Collection Time: 01/28/18  6:30 PM  Result Value Ref Range   Color, Urine STRAW (A) YELLOW   APPearance CLEAR CLEAR   Specific Gravity, Urine 1.005 1.005 - 1.030   pH 7.0 5.0 - 8.0   Glucose, UA NEGATIVE NEGATIVE mg/dL   Hgb urine dipstick SMALL (A) NEGATIVE   Bilirubin Urine NEGATIVE NEGATIVE   Ketones, ur NEGATIVE NEGATIVE mg/dL   Protein, ur NEGATIVE NEGATIVE mg/dL   Nitrite NEGATIVE NEGATIVE   Leukocytes, UA TRACE (A) NEGATIVE   RBC / HPF 0-5 0 - 5 RBC/hpf   WBC, UA 0-5 0 - 5 WBC/hpf   Bacteria, UA RARE (A) NONE SEEN   Squamous Epithelial / LPF 0-5 0 - 5    MDM NST:  Baseline: 130 bpm, Variability: Good {> 6 bpm), Accelerations: Reactive and Decelerations: Absent  VSS. Pt afebrile and normotensive Fetal tracing appropriate for gestation.  Based on exam and HPI, headache most likely r/t sinus infection C/w Dr.  Juliene Pina. Will d/c home with Rx augmentin  Assessment and Plan  A:  1. Rhinosinusitis   2. [redacted] weeks gestation of pregnancy    P: Discharge home Rx augmentin 875 BID x 7 days Increase fluids Discussed use of tylenol at home and max daily amt Discussed reasons to return to MAU Keep f/u with OB  Judeth Horn 01/28/2018, 8:02 PM

## 2018-01-31 ENCOUNTER — Ambulatory Visit (HOSPITAL_COMMUNITY)
Admission: RE | Admit: 2018-01-31 | Discharge: 2018-01-31 | Disposition: A | Discharge status: Home / Self Care | Source: Ambulatory Visit | Attending: Obstetrics & Gynecology | Admitting: Obstetrics & Gynecology

## 2018-01-31 ENCOUNTER — Ambulatory Visit (HOSPITAL_COMMUNITY)

## 2018-01-31 DIAGNOSIS — O283 Abnormal ultrasonic finding on antenatal screening of mother: Secondary | ICD-10-CM

## 2018-01-31 DIAGNOSIS — Z363 Encounter for antenatal screening for malformations: Secondary | ICD-10-CM | POA: Diagnosis present

## 2018-01-31 DIAGNOSIS — O26613 Liver and biliary tract disorders in pregnancy, third trimester: Secondary | ICD-10-CM | POA: Insufficient documentation

## 2018-01-31 DIAGNOSIS — K831 Obstruction of bile duct: Secondary | ICD-10-CM | POA: Insufficient documentation

## 2018-01-31 DIAGNOSIS — Z3A29 29 weeks gestation of pregnancy: Secondary | ICD-10-CM | POA: Insufficient documentation

## 2018-03-11 ENCOUNTER — Encounter (HOSPITAL_COMMUNITY): Payer: Self-pay

## 2018-03-11 ENCOUNTER — Other Ambulatory Visit: Payer: Self-pay

## 2018-03-11 ENCOUNTER — Inpatient Hospital Stay (HOSPITAL_COMMUNITY)
Admission: AD | Admit: 2018-03-11 | Discharge: 2018-03-11 | Disposition: A | Discharge status: Home / Self Care | Source: Ambulatory Visit | Attending: Obstetrics & Gynecology | Admitting: Obstetrics & Gynecology

## 2018-03-11 DIAGNOSIS — O4703 False labor before 37 completed weeks of gestation, third trimester: Secondary | ICD-10-CM | POA: Diagnosis not present

## 2018-03-11 DIAGNOSIS — Z3A34 34 weeks gestation of pregnancy: Secondary | ICD-10-CM | POA: Diagnosis not present

## 2018-03-11 DIAGNOSIS — O368130 Decreased fetal movements, third trimester, not applicable or unspecified: Secondary | ICD-10-CM | POA: Diagnosis present

## 2018-03-11 LAB — URINALYSIS, ROUTINE W REFLEX MICROSCOPIC
BACTERIA UA: NONE SEEN
BILIRUBIN URINE: NEGATIVE
GLUCOSE, UA: NEGATIVE mg/dL
Hgb urine dipstick: NEGATIVE
KETONES UR: NEGATIVE mg/dL
NITRITE: NEGATIVE
Protein, ur: NEGATIVE mg/dL
Specific Gravity, Urine: 1.001 — ABNORMAL LOW (ref 1.005–1.030)
pH: 8 (ref 5.0–8.0)

## 2018-03-11 MED ORDER — BETAMETHASONE SOD PHOS & ACET 6 (3-3) MG/ML IJ SUSP
12.0000 mg | Freq: Once | INTRAMUSCULAR | Status: AC
  Administered 2018-03-11: 12 mg via INTRAMUSCULAR
  Filled 2018-03-11: qty 2

## 2018-03-11 MED ORDER — NIFEDIPINE 10 MG PO CAPS
10.0000 mg | ORAL_CAPSULE | ORAL | Status: DC | PRN
  Administered 2018-03-11: 10 mg via ORAL
  Filled 2018-03-11 (×2): qty 1

## 2018-03-11 NOTE — Discharge Instructions (Signed)
Braxton Hicks Contractions °Contractions of the uterus can occur throughout pregnancy, but they are not always a sign that you are in labor. You may have practice contractions called Braxton Hicks contractions. These false labor contractions are sometimes confused with true labor. °What are Braxton Hicks contractions? °Braxton Hicks contractions are tightening movements that occur in the muscles of the uterus before labor. Unlike true labor contractions, these contractions do not result in opening (dilation) and thinning of the cervix. Toward the end of pregnancy (32-34 weeks), Braxton Hicks contractions can happen more often and may become stronger. These contractions are sometimes difficult to tell apart from true labor because they can be very uncomfortable. You should not feel embarrassed if you go to the hospital with false labor. °Sometimes, the only way to tell if you are in true labor is for your health care provider to look for changes in the cervix. The health care provider will do a physical exam and may monitor your contractions. If you are not in true labor, the exam should show that your cervix is not dilating and your water has not broken. °If there are other health problems associated with your pregnancy, it is completely safe for you to be sent home with false labor. You may continue to have Braxton Hicks contractions until you go into true labor. °How to tell the difference between true labor and false labor °True labor °· Contractions last 30-70 seconds. °· Contractions become very regular. °· Discomfort is usually felt in the top of the uterus, and it spreads to the lower abdomen and low back. °· Contractions do not go away with walking. °· Contractions usually become more intense and increase in frequency. °· The cervix dilates and gets thinner. °False labor °· Contractions are usually shorter and not as strong as true labor contractions. °· Contractions are usually irregular. °· Contractions  are often felt in the front of the lower abdomen and in the groin. °· Contractions may go away when you walk around or change positions while lying down. °· Contractions get weaker and are shorter-lasting as time goes on. °· The cervix usually does not dilate or become thin. °Follow these instructions at home: °· Take over-the-counter and prescription medicines only as told by your health care provider. °· Keep up with your usual exercises and follow other instructions from your health care provider. °· Eat and drink lightly if you think you are going into labor. °· If Braxton Hicks contractions are making you uncomfortable: °? Change your position from lying down or resting to walking, or change from walking to resting. °? Sit and rest in a tub of warm water. °? Drink enough fluid to keep your urine pale yellow. Dehydration may cause these contractions. °? Do slow and deep breathing several times an hour. °· Keep all follow-up prenatal visits as told by your health care provider. This is important. °Contact a health care provider if: °· You have a fever. °· You have continuous pain in your abdomen. °Get help right away if: °· Your contractions become stronger, more regular, and closer together. °· You have fluid leaking or gushing from your vagina. °· You pass blood-tinged mucus (bloody show). °· You have bleeding from your vagina. °· You have low back pain that you never had before. °· You feel your baby’s head pushing down and causing pelvic pressure. °· Your baby is not moving inside you as much as it used to. °Summary °· Contractions that occur before labor are called Braxton   Hicks contractions, false labor, or practice contractions. °· Braxton Hicks contractions are usually shorter, weaker, farther apart, and less regular than true labor contractions. True labor contractions usually become progressively stronger and regular and they become more frequent. °· Manage discomfort from Braxton Hicks contractions by  changing position, resting in a warm bath, drinking plenty of water, or practicing deep breathing. °This information is not intended to replace advice given to you by your health care provider. Make sure you discuss any questions you have with your health care provider. °Document Released: 12/13/2016 Document Revised: 12/13/2016 Document Reviewed: 12/13/2016 °[employer] Interactive Patient Education © 2018 [employer] ° °Fetal Movement Counts °Patient Name: ________________________________________________ Patient Due Date: ____________________ °What is a fetal movement count? °A fetal movement count is the number of times that you feel your baby move during a certain amount of time. This may also be called a fetal kick count. A fetal movement count is recommended for every pregnant woman. You may be asked to start counting fetal movements as early as week 28 of your pregnancy. °Pay attention to when your baby is most active. You may notice your baby's sleep and wake cycles. You may also notice things that make your baby move more. You should do a fetal movement count: °· When your baby is normally most active. °· At the same time each day. ° °A good time to count movements is while you are resting, after having something to eat and drink. °How do I count fetal movements? °1. Find a quiet, comfortable area. Sit, or lie down on your side. °2. Write down the date, the start time and stop time, and the number of movements that you felt between those two times. Take this information with you to your health care visits. °3. For 2 hours, count kicks, flutters, swishes, rolls, and jabs. You should feel at least 10 movements during 2 hours. °4. You may stop counting after you have felt 10 movements. °5. If you do not feel 10 movements in 2 hours, have something to eat and drink. Then, keep resting and counting for 1 hour. If you feel at least 4 movements during that hour, you may stop counting. °Contact a health care  provider if: °· You feel fewer than 4 movements in 2 hours. °· Your baby is not moving like he or she usually does. °Date: ____________ Start time: ____________ Stop time: ____________ Movements: ____________ °Date: ____________ Start time: ____________ Stop time: ____________ Movements: ____________ °Date: ____________ Start time: ____________ Stop time: ____________ Movements: ____________ °Date: ____________ Start time: ____________ Stop time: ____________ Movements: ____________ °Date: ____________ Start time: ____________ Stop time: ____________ Movements: ____________ °Date: ____________ Start time: ____________ Stop time: ____________ Movements: ____________ °Date: ____________ Start time: ____________ Stop time: ____________ Movements: ____________ °Date: ____________ Start time: ____________ Stop time: ____________ Movements: ____________ °Date: ____________ Start time: ____________ Stop time: ____________ Movements: ____________ °This information is not intended to replace advice given to you by your health care provider. Make sure you discuss any questions you have with your health care provider. °Document Released: 08/29/2006 Document Revised: 03/28/2016 Document Reviewed: 09/08/2015 °[employer] Interactive Patient Education © 2018 [employer] ° °

## 2018-03-11 NOTE — MAU Provider Note (Signed)
Chief Complaint:  Contractions and Decreased Fetal Movement   First Provider Initiated Contact with Patient 03/11/18 [phone]     HPI: Kristine [name] is a 37 y.o. G3P2002 at [redacted]w[redacted]d who presents to maternity admissions reporting contractions. Woke up this morning with abdominal pain. Feels like contractions. Can't tell how frequent they are but feels like they're back to back. Had intercourse last night. Has appt this afternoon at [facility] ob/gyn. Had decreased fetal movement this morning but movement has returned to normal.  Denies LOF or vaginal bleeding.   Location: abdomen Quality: sharp, contractions Severity: 10/10 in pain scale Duration: 2 hours Timing: intermittent, can't tell how frequently Modifying factors: none Associated signs and symptoms: none  Pregnancy Course:   Past Medical History:  Diagnosis Date  . Acute blood loss anemia 02/21/2013  . Anemia, iron deficiency 02/21/2013  . Cholestasis during pregnancy in second trimester 01/28/2018  . Constipation, chronic 12/12/2012  . Depression   . Previous cesarean delivery, antepartum condition or complication 11/22/2016  . Vitamin D deficiency, unspecified    OB History  Gravida Para Term Preterm AB Living  3 2 2     2   SAB TAB Ectopic Multiple Live Births        0 2    # Outcome Date GA Lbr Len/2nd Weight Sex Delivery Anes PTL Lv  3 Current           2 Term 11/22/16 [redacted]w[redacted]d  7 lb 3.9 oz (3.285 kg) F CS-LTranv Spinal  LIV  1 Term 02/20/13 104w5d  5 lb 14 oz (2.665 kg) M CS-LTranv EPI  LIV   Past Surgical History:  Procedure Laterality Date  . CESAREAN SECTION N/A 02/20/2013   Procedure: CESAREAN SECTION;  Surgeon: Robley Fries, MD;  Location: [facility] ORS;  Service: Obstetrics;  Laterality: N/A;  . CESAREAN SECTION N/A 11/22/2016   Procedure: REPEAT CESAREAN SECTION;  Surgeon: Shea Evans, MD;  Location: [facility] BIRTHING SUITES;  Service: Obstetrics;  Laterality: N/A;  EDD: 11/29/16   Family History  Problem Relation Age of Onset   . Hypertension Mother   . Cancer Mother        uterus  . Hypertension Paternal Uncle   . Other Neg Hx   . Hearing loss Neg Hx   . Alcohol abuse Neg Hx   . Arthritis Neg Hx   . Asthma Neg Hx   . Birth defects Neg Hx   . COPD Neg Hx   . Depression Neg Hx   . Diabetes Neg Hx   . Drug abuse Neg Hx   . Early death Neg Hx   . Heart disease Neg Hx   . Hyperlipidemia Neg Hx   . Kidney disease Neg Hx   . Learning disabilities Neg Hx   . Mental illness Neg Hx   . Mental retardation Neg Hx   . Miscarriages / Stillbirths Neg Hx   . Stroke Neg Hx   . Vision loss Neg Hx    Social History   Tobacco Use  . Smoking status: Never Smoker  . Smokeless tobacco: Never Used  Substance Use Topics  . Alcohol use: No  . Drug use: No   No Known Allergies No medications prior to admission.    I have reviewed patient's Past Medical Hx, Surgical Hx, Family Hx, Social Hx, medications and allergies.   ROS:  Review of Systems  Constitutional: Negative.   Gastrointestinal: Positive for abdominal pain.  Genitourinary: Negative.     Physical Exam  Patient Vitals for the past 24 hrs:  BP Temp Temp src Pulse Resp SpO2 Height Weight  03/11/18 1025 (!) 99/59 - - 64 - - - -  03/11/18 0942 (!) 99/57 - - 81 - - - -  03/11/18 0936 (!) 95/59 - - 83 - - - -  03/11/18 0935 - - - - - 97 % - -  03/11/18 0930 - - - - - 99 % - -  03/11/18 0925 - - - - - 99 % - -  03/11/18 0920 - - - - - 98 % - -  03/11/18 0915 101/63 - - 65 - 99 % - -  03/11/18 0910 - - - - - 99 % - -  03/11/18 0905 - - - - - 98 % - -  03/11/18 0753 92/61 (!) 97.5 F (36.4 C) Oral 74 18 - 5\' 3"  (1.6 m) 164 lb 1.9 oz (74.4 kg)    Constitutional: Well-developed, well-nourished female in no acute distress.  Cardiovascular: normal rate & rhythm, no murmur Respiratory: normal effort, lung sounds clear throughout GI: Abd soft, non-tender, gravid appropriate for gestational age. Pos BS x 4 MS: Extremities nontender, no edema, normal  ROM Neurologic: Alert and oriented x 4.  GU:        Dilation: Closed Exam by:: Estanislado SpireE. Ralph Brouwer NP  Ctx palpate mild with adequate resting tone  NST:  Baseline: 125 bpm, Variability: Good {> 6 bpm), Accelerations: Reactive and Decelerations: Absent   Labs: Results for orders placed or performed during the hospital encounter of 03/11/18 (from the past 24 hour(s))  Urinalysis, Routine w reflex microscopic     Status: Abnormal   Collection Time: 03/11/18  8:00 AM  Result Value Ref Range   Color, Urine COLORLESS (A) YELLOW   APPearance CLEAR CLEAR   Specific Gravity, Urine 1.001 (L) 1.005 - 1.030   pH 8.0 5.0 - 8.0   Glucose, UA NEGATIVE NEGATIVE mg/dL   Hgb urine dipstick NEGATIVE NEGATIVE   Bilirubin Urine NEGATIVE NEGATIVE   Ketones, ur NEGATIVE NEGATIVE mg/dL   Protein, ur NEGATIVE NEGATIVE mg/dL   Nitrite NEGATIVE NEGATIVE   Leukocytes, UA SMALL (A) NEGATIVE   RBC / HPF 0-5 0 - 5 RBC/hpf   WBC, UA 0-5 0 - 5 WBC/hpf   Bacteria, UA NONE SEEN NONE SEEN   Squamous Epithelial / LPF 0-5 0 - 5    Imaging:  No results found.  MAU Course: Orders Placed This Encounter  Procedures  . Urinalysis, Routine w reflex microscopic  . Discharge patient   Meds ordered this encounter  Medications  . DISCONTD: NIFEdipine (PROCARDIA) capsule 10 mg  . betamethasone acetate-betamethasone sodium phosphate (CELESTONE) injection 12 mg    MDM: Reactive NST Cervix closed C/w Dr. Juliene PinaMody. Will give procardia and BMZ. If ctx improve pt can be discharged to keep f/u this afternoon in office for BPP. Pt will have 2nd BMZ in office tomorrow.   Procardia 10 mg given --- unable to give additional doses d/t low BP. Ctx spaced out & patient reports improvement in symptoms.   Assessment: 1. Preterm uterine contractions in third trimester, antepartum   2. [redacted] weeks gestation of pregnancy     Plan: Discharge home in stable condition.   Follow-up Information    Obgyn, [facility] Follow up.   Contact  information: [address] [place] [id number] [phone]           Allergies as of 03/11/2018   No Known Allergies  Medication List    STOP taking these medications   benzonatate 100 MG capsule Commonly known as:  TESSALON     TAKE these medications   acetaminophen 325 MG tablet Commonly known as:  TYLENOL Take by mouth every 6 (six) hours as needed.   PRENATAL VITAMINS PO Take by mouth.   sertraline 25 MG tablet Commonly known as:  ZOLOFT Take 25 mg by mouth daily.   VITAMIN D PO Take by mouth.       Judeth Horn, NP 03/11/2018 7:41 PM

## 2018-03-11 NOTE — MAU Note (Signed)
Pt C/O contractions since 0610, very frequent & painful.   Denies bleeding or LOF.  Decreased fetal movement this morning.

## 2018-03-13 ENCOUNTER — Encounter (HOSPITAL_COMMUNITY): Payer: Self-pay

## 2018-03-13 ENCOUNTER — Other Ambulatory Visit: Payer: Self-pay | Admitting: Obstetrics & Gynecology

## 2018-03-27 ENCOUNTER — Encounter (HOSPITAL_COMMUNITY)
Admission: RE | Admit: 2018-03-27 | Discharge: 2018-03-27 | Disposition: A | Discharge status: Home / Self Care | Source: Ambulatory Visit | Attending: Obstetrics & Gynecology | Admitting: Obstetrics & Gynecology

## 2018-03-27 LAB — CBC
HEMATOCRIT: 33.1 % — AB (ref 36.0–46.0)
HEMOGLOBIN: 11.4 g/dL — AB (ref 12.0–15.0)
MCH: 32.6 pg (ref 26.0–34.0)
MCHC: 34.4 g/dL (ref 30.0–36.0)
MCV: 94.6 fL (ref 78.0–100.0)
Platelets: 229 10*3/uL (ref 150–400)
RBC: 3.5 MIL/uL — ABNORMAL LOW (ref 3.87–5.11)
RDW: 14.5 % (ref 11.5–15.5)
WBC: 9 10*3/uL (ref 4.0–10.5)

## 2018-03-27 LAB — COMPREHENSIVE METABOLIC PANEL
ALK PHOS: 127 U/L — AB (ref 38–126)
ALT: 11 U/L (ref 0–44)
ANION GAP: 10 (ref 5–15)
AST: 14 U/L — ABNORMAL LOW (ref 15–41)
Albumin: 3.1 g/dL — ABNORMAL LOW (ref 3.5–5.0)
BILIRUBIN TOTAL: 1.1 mg/dL (ref 0.3–1.2)
BUN: 7 mg/dL (ref 6–20)
CALCIUM: 9 mg/dL (ref 8.9–10.3)
CO2: 22 mmol/L (ref 22–32)
CREATININE: 0.4 mg/dL — AB (ref 0.44–1.00)
Chloride: 103 mmol/L (ref 98–111)
GFR calc non Af Amer: >60 mL/min (ref >60)
Glucose, Bld: 113 mg/dL — ABNORMAL HIGH (ref 70–99)
Potassium: 4.1 mmol/L (ref 3.5–5.1)
Sodium: 135 mmol/L (ref 135–145)
TOTAL PROTEIN: 6.3 g/dL — AB (ref 6.5–8.1)

## 2018-03-27 LAB — TYPE AND SCREEN
ABO/RH(D): O POS
ANTIBODY SCREEN: NEGATIVE

## 2018-03-27 NOTE — Patient Instructions (Signed)
Kristine [name]  03/27/2018   Your procedure is scheduled on:  03/28/2018  [facility] through [facility] at 0530 AM.  Pick up the phone at the desk and dial [phone]  Call this number if you have problems the morning of surgery:[phone]  Remember:   Do not eat food:(After Midnight) Desps de medianoche.  Do not drink clear liquids: (After Midnight) Desps de medianoche.  Take these medicines the morning of surgery with A SIP OF WATER: take actigall as directed   Do not wear jewelry, make-up or nail polish.  Do not wear lotions, powders, or perfumes. Do not wear deodorant.  Do not shave 48 hours prior to surgery.  Do not bring valuables to the hospital.  [facility] is not   responsible for any belongings or valuables brought to the hospital.  Contacts, dentures or bridgework may not be worn into surgery.  Leave suitcase in the car. After surgery it may be brought to your room.  For patients admitted to the hospital, checkout time is 11:00 AM the day of              discharge.    N/A   Please read over the following fact sheets that you were given:   Surgical Site Infection Prevention

## 2018-03-28 ENCOUNTER — Encounter (HOSPITAL_COMMUNITY)
Admission: RE | Disposition: A | Discharge status: Home / Self Care | Payer: Self-pay | Source: Home / Self Care | Attending: Obstetrics

## 2018-03-28 ENCOUNTER — Inpatient Hospital Stay (HOSPITAL_COMMUNITY): Admitting: Anesthesiology

## 2018-03-28 ENCOUNTER — Inpatient Hospital Stay (HOSPITAL_COMMUNITY)

## 2018-03-28 ENCOUNTER — Encounter (HOSPITAL_COMMUNITY): Payer: Self-pay | Admitting: *Deleted

## 2018-03-28 ENCOUNTER — Other Ambulatory Visit: Payer: Self-pay

## 2018-03-28 ENCOUNTER — Inpatient Hospital Stay (HOSPITAL_COMMUNITY)
Admission: RE | Admit: 2018-03-28 | Discharge: 2018-03-30 | DRG: 786 | Disposition: A | Discharge status: Home / Self Care | Attending: Obstetrics | Admitting: Obstetrics

## 2018-03-28 DIAGNOSIS — K831 Obstruction of bile duct: Secondary | ICD-10-CM | POA: Diagnosis present

## 2018-03-28 DIAGNOSIS — Z3A37 37 weeks gestation of pregnancy: Secondary | ICD-10-CM | POA: Diagnosis not present

## 2018-03-28 DIAGNOSIS — R55 Syncope and collapse: Secondary | ICD-10-CM

## 2018-03-28 DIAGNOSIS — Z98891 History of uterine scar from previous surgery: Secondary | ICD-10-CM

## 2018-03-28 DIAGNOSIS — F329 Major depressive disorder, single episode, unspecified: Secondary | ICD-10-CM | POA: Diagnosis present

## 2018-03-28 DIAGNOSIS — F419 Anxiety disorder, unspecified: Secondary | ICD-10-CM | POA: Diagnosis present

## 2018-03-28 DIAGNOSIS — O99344 Other mental disorders complicating childbirth: Secondary | ICD-10-CM | POA: Diagnosis present

## 2018-03-28 DIAGNOSIS — O2662 Liver and biliary tract disorders in childbirth: Principal | ICD-10-CM | POA: Diagnosis present

## 2018-03-28 DIAGNOSIS — R4182 Altered mental status, unspecified: Secondary | ICD-10-CM | POA: Diagnosis not present

## 2018-03-28 DIAGNOSIS — H518 Other specified disorders of binocular movement: Secondary | ICD-10-CM | POA: Diagnosis not present

## 2018-03-28 DIAGNOSIS — O99824 Streptococcus B carrier state complicating childbirth: Secondary | ICD-10-CM | POA: Diagnosis present

## 2018-03-28 DIAGNOSIS — O9089 Other complications of the puerperium, not elsewhere classified: Secondary | ICD-10-CM | POA: Diagnosis not present

## 2018-03-28 DIAGNOSIS — R404 Transient alteration of awareness: Secondary | ICD-10-CM

## 2018-03-28 DIAGNOSIS — R531 Weakness: Secondary | ICD-10-CM

## 2018-03-28 DIAGNOSIS — J9690 Respiratory failure, unspecified, unspecified whether with hypoxia or hypercapnia: Secondary | ICD-10-CM

## 2018-03-28 DIAGNOSIS — R569 Unspecified convulsions: Secondary | ICD-10-CM

## 2018-03-28 DIAGNOSIS — O34211 Maternal care for low transverse scar from previous cesarean delivery: Secondary | ICD-10-CM | POA: Diagnosis present

## 2018-03-28 LAB — BLOOD GAS, ARTERIAL
Acid-base deficit: 5.3 mmol/L — ABNORMAL HIGH (ref 0.0–2.0)
BICARBONATE: 19.1 mmol/L — AB (ref 20.0–28.0)
DRAWN BY: 14426
FIO2: 100
O2 SAT: 100 %
PCO2 ART: 35.4 mmHg (ref 32.0–48.0)
PH ART: 7.352 (ref 7.350–7.450)
pO2, Arterial: 335 mmHg — ABNORMAL HIGH (ref 83.0–108.0)

## 2018-03-28 LAB — CBC
HEMATOCRIT: 30 % — AB (ref 36.0–46.0)
HEMATOCRIT: 33.2 % — AB (ref 36.0–46.0)
Hemoglobin: 10.5 g/dL — ABNORMAL LOW (ref 12.0–15.0)
Hemoglobin: 10.8 g/dL — ABNORMAL LOW (ref 12.0–15.0)
MCH: 32.8 pg (ref 26.0–34.0)
MCH: 31.1 pg (ref 26.0–34.0)
MCHC: 35 g/dL (ref 30.0–36.0)
MCHC: 32.5 g/dL (ref 30.0–36.0)
MCV: 93.8 fL (ref 78.0–100.0)
MCV: 95.7 fL (ref 78.0–100.0)
PLATELETS: 212 10*3/uL (ref 150–400)
Platelets: 225 10*3/uL (ref 150–400)
RBC: 3.2 MIL/uL — AB (ref 3.87–5.11)
RBC: 3.47 MIL/uL — ABNORMAL LOW (ref 3.87–5.11)
RDW: 14.2 % (ref 11.5–15.5)
RDW: 14 % (ref 11.5–15.5)
WBC: 11.1 10*3/uL — AB (ref 4.0–10.5)
WBC: 11.4 10*3/uL — ABNORMAL HIGH (ref 4.0–10.5)

## 2018-03-28 LAB — COMPREHENSIVE METABOLIC PANEL
ALBUMIN: 2.6 g/dL — AB (ref 3.5–5.0)
ALT: 9 U/L (ref 0–44)
AST: 15 U/L (ref 15–41)
Alkaline Phosphatase: 105 U/L (ref 38–126)
Anion gap: 12 (ref 5–15)
BUN: 6 mg/dL (ref 6–20)
CALCIUM: 8 mg/dL — AB (ref 8.9–10.3)
CO2: 17 mmol/L — AB (ref 22–32)
Chloride: 105 mmol/L (ref 98–111)
Creatinine, Ser: 0.41 mg/dL — ABNORMAL LOW (ref 0.44–1.00)
GFR calc non Af Amer: >60 mL/min (ref >60)
GLUCOSE: 117 mg/dL — AB (ref 70–99)
POTASSIUM: 4 mmol/L (ref 3.5–5.1)
SODIUM: 134 mmol/L — AB (ref 135–145)
TOTAL PROTEIN: 5.6 g/dL — AB (ref 6.5–8.1)
Total Bilirubin: 0.7 mg/dL (ref 0.3–1.2)

## 2018-03-28 LAB — RPR: RPR Ser Ql: NONREACTIVE

## 2018-03-28 LAB — CREATININE, SERUM
Creatinine, Ser: 0.46 mg/dL (ref 0.44–1.00)
GFR calc Af Amer: >60 mL/min (ref >60)
GFR calc non Af Amer: >60 mL/min (ref >60)

## 2018-03-28 LAB — GLUCOSE, CAPILLARY: Glucose-Capillary: 118 mg/dL — ABNORMAL HIGH (ref 70–99)

## 2018-03-28 LAB — D-DIMER, QUANTITATIVE: D-Dimer, Quant: 2.05 ug/mL-FEU — ABNORMAL HIGH (ref 0.00–0.50)

## 2018-03-28 LAB — PROTIME-INR
INR: 0.97
PROTHROMBIN TIME: 12.8 s (ref 11.4–15.2)

## 2018-03-28 LAB — MRSA PCR SCREENING: MRSA by PCR: NEGATIVE

## 2018-03-28 SURGERY — Surgical Case
Anesthesia: Spinal

## 2018-03-28 MED ORDER — ACETAMINOPHEN 500 MG PO TABS
1000.0000 mg | ORAL_TABLET | Freq: Four times a day (QID) | ORAL | Status: DC | PRN
  Administered 2018-03-28: 1000 mg via ORAL
  Filled 2018-03-28: qty 2

## 2018-03-28 MED ORDER — LACTATED RINGERS IV SOLN
INTRAVENOUS | Status: DC | PRN
  Administered 2018-03-28: 40 [IU] via INTRAVENOUS

## 2018-03-28 MED ORDER — OXYTOCIN 10 UNIT/ML IJ SOLN
INTRAMUSCULAR | Status: AC
  Filled 2018-03-28: qty 4

## 2018-03-28 MED ORDER — SIMETHICONE 80 MG PO CHEW
80.0000 mg | CHEWABLE_TABLET | ORAL | Status: DC | PRN
  Administered 2018-03-29: 80 mg via ORAL

## 2018-03-28 MED ORDER — OXYCODONE HCL 5 MG PO TABS
10.0000 mg | ORAL_TABLET | ORAL | Status: DC | PRN
  Filled 2018-03-28: qty 2

## 2018-03-28 MED ORDER — LACTATED RINGERS IV SOLN
INTRAVENOUS | Status: DC
  Administered 2018-03-28 (×2): via INTRAVENOUS

## 2018-03-28 MED ORDER — PHENYLEPHRINE 40 MCG/ML (10ML) SYRINGE FOR IV PUSH (FOR BLOOD PRESSURE SUPPORT)
PREFILLED_SYRINGE | INTRAVENOUS | Status: AC
Start: 2018-03-28 — End: ?
  Filled 2018-03-28: qty 10

## 2018-03-28 MED ORDER — LACTATED RINGERS IV SOLN
INTRAVENOUS | Status: DC
  Administered 2018-03-28 (×3): via INTRAVENOUS

## 2018-03-28 MED ORDER — COCONUT OIL OIL: 1.0000 "application " | TOPICAL_OIL | Status: DC | PRN

## 2018-03-28 MED ORDER — OXYCODONE HCL 5 MG PO TABS
5.0000 mg | ORAL_TABLET | ORAL | Status: DC | PRN
  Administered 2018-03-29 (×2): 5 mg via ORAL
  Filled 2018-03-28: qty 1

## 2018-03-28 MED ORDER — MIDAZOLAM HCL 2 MG/2ML IJ SOLN
INTRAMUSCULAR | Status: DC | PRN
  Administered 2018-03-28: 2 mg via INTRAVENOUS

## 2018-03-28 MED ORDER — EPHEDRINE SULFATE 50 MG/ML IJ SOLN
INTRAMUSCULAR | Status: DC | PRN
  Administered 2018-03-28 (×2): 10 mg via INTRAVENOUS

## 2018-03-28 MED ORDER — DIPHENHYDRAMINE HCL 25 MG PO CAPS: 25.0000 mg | ORAL_CAPSULE | Freq: Four times a day (QID) | ORAL | Status: DC | PRN

## 2018-03-28 MED ORDER — ENOXAPARIN SODIUM 40 MG/0.4ML ~~LOC~~ SOLN
40.0000 mg | SUBCUTANEOUS | Status: DC
  Administered 2018-03-29 – 2018-03-30 (×2): 40 mg via SUBCUTANEOUS
  Filled 2018-03-28 (×2): qty 0.4

## 2018-03-28 MED ORDER — ONDANSETRON HCL 4 MG/2ML IJ SOLN
INTRAMUSCULAR | Status: AC
  Filled 2018-03-28: qty 2

## 2018-03-28 MED ORDER — SENNOSIDES-DOCUSATE SODIUM 8.6-50 MG PO TABS
2.0000 | ORAL_TABLET | ORAL | Status: DC
  Administered 2018-03-29 (×2): 2 via ORAL
  Filled 2018-03-28 (×2): qty 2

## 2018-03-28 MED ORDER — PHENYLEPHRINE 8 MG IN D5W 100 ML (0.08MG/ML) PREMIX OPTIME
INJECTION | INTRAVENOUS | Status: AC
  Filled 2018-03-28: qty 100

## 2018-03-28 MED ORDER — MIDAZOLAM HCL 2 MG/2ML IJ SOLN
INTRAMUSCULAR | Status: AC
  Filled 2018-03-28: qty 2

## 2018-03-28 MED ORDER — LACTATED RINGERS IV SOLN
INTRAVENOUS | Status: DC
  Administered 2018-03-28: 23:00:00 via INTRAVENOUS

## 2018-03-28 MED ORDER — IBUPROFEN 600 MG PO TABS
600.0000 mg | ORAL_TABLET | Freq: Four times a day (QID) | ORAL | Status: DC
  Administered 2018-03-29 – 2018-03-30 (×7): 600 mg via ORAL
  Filled 2018-03-28 (×7): qty 1

## 2018-03-28 MED ORDER — OXYTOCIN 40 UNITS IN LACTATED RINGERS INFUSION - SIMPLE MED: 2.5000 [IU]/h | INTRAVENOUS | Status: AC

## 2018-03-28 MED ORDER — SIMETHICONE 80 MG PO CHEW
80.0000 mg | CHEWABLE_TABLET | Freq: Three times a day (TID) | ORAL | Status: DC
  Administered 2018-03-29 – 2018-03-30 (×3): 80 mg via ORAL
  Filled 2018-03-28 (×4): qty 1

## 2018-03-28 MED ORDER — TETANUS-DIPHTH-ACELL PERTUSSIS 5-2.5-18.5 LF-MCG/0.5 IM SUSP: 0.5000 mL | Freq: Once | INTRAMUSCULAR | Status: DC

## 2018-03-28 MED ORDER — ONDANSETRON HCL 4 MG/2ML IJ SOLN
INTRAMUSCULAR | Status: DC | PRN
  Administered 2018-03-28: 4 mg via INTRAVENOUS

## 2018-03-28 MED ORDER — LACTATED RINGERS IV SOLN
INTRAVENOUS | Status: DC | PRN
  Administered 2018-03-28: 09:00:00 via INTRAVENOUS

## 2018-03-28 MED ORDER — SIMETHICONE 80 MG PO CHEW
80.0000 mg | CHEWABLE_TABLET | ORAL | Status: DC
  Administered 2018-03-29 (×2): 80 mg via ORAL
  Filled 2018-03-28 (×2): qty 1

## 2018-03-28 MED ORDER — EPHEDRINE 5 MG/ML INJ
INTRAVENOUS | Status: AC
  Filled 2018-03-28: qty 10

## 2018-03-28 MED ORDER — IOPAMIDOL (ISOVUE-370) INJECTION 76%
INTRAVENOUS | Status: AC
  Administered 2018-03-28: 50 mL
  Filled 2018-03-28: qty 100

## 2018-03-28 MED ORDER — MENTHOL 3 MG MT LOZG: 1.0000 | LOZENGE | OROMUCOSAL | Status: DC | PRN

## 2018-03-28 MED ORDER — ACETAMINOPHEN 325 MG PO TABS
650.0000 mg | ORAL_TABLET | ORAL | Status: DC | PRN
  Administered 2018-03-29 (×2): 650 mg via ORAL
  Filled 2018-03-28 (×2): qty 2

## 2018-03-28 MED ORDER — SODIUM CHLORIDE 0.9 % IV SOLN: 250.0000 mL | INTRAVENOUS | Status: DC | PRN

## 2018-03-28 MED ORDER — OXYCODONE-ACETAMINOPHEN 5-325 MG PO TABS: 1.0000 | ORAL_TABLET | ORAL | Status: DC | PRN

## 2018-03-28 MED ORDER — PHENYLEPHRINE 8 MG IN D5W 100 ML (0.08MG/ML) PREMIX OPTIME
INJECTION | INTRAVENOUS | Status: DC | PRN
  Administered 2018-03-28: 60 ug/min via INTRAVENOUS

## 2018-03-28 MED ORDER — DIBUCAINE 1 % RE OINT: 1.0000 "application " | TOPICAL_OINTMENT | RECTAL | Status: DC | PRN

## 2018-03-28 MED ORDER — BUPIVACAINE IN DEXTROSE 0.75-8.25 % IT SOLN
INTRATHECAL | Status: DC | PRN
  Administered 2018-03-28: 1.6 mg via INTRATHECAL

## 2018-03-28 MED ORDER — MORPHINE SULFATE (PF) 0.5 MG/ML IJ SOLN
INTRAMUSCULAR | Status: DC | PRN
  Administered 2018-03-28: .15 mg via INTRATHECAL

## 2018-03-28 MED ORDER — PRENATAL MULTIVITAMIN CH
1.0000 | ORAL_TABLET | Freq: Every day | ORAL | Status: DC
  Administered 2018-03-29 – 2018-03-30 (×2): 1 via ORAL
  Filled 2018-03-28 (×2): qty 1

## 2018-03-28 MED ORDER — FENTANYL CITRATE (PF) 100 MCG/2ML IJ SOLN
INTRAMUSCULAR | Status: AC
  Filled 2018-03-28: qty 2

## 2018-03-28 MED ORDER — PHENYLEPHRINE 40 MCG/ML (10ML) SYRINGE FOR IV PUSH (FOR BLOOD PRESSURE SUPPORT)
PREFILLED_SYRINGE | INTRAVENOUS | Status: DC | PRN
  Administered 2018-03-28: 80 ug via INTRAVENOUS

## 2018-03-28 MED ORDER — MORPHINE SULFATE (PF) 0.5 MG/ML IJ SOLN
INTRAMUSCULAR | Status: AC
  Filled 2018-03-28: qty 10

## 2018-03-28 MED ORDER — CEFAZOLIN SODIUM-DEXTROSE 2-4 GM/100ML-% IV SOLN
2.0000 g | INTRAVENOUS | Status: AC
  Administered 2018-03-28: 2 g via INTRAVENOUS

## 2018-03-28 MED ORDER — FENTANYL CITRATE (PF) 100 MCG/2ML IJ SOLN
INTRAMUSCULAR | Status: DC | PRN
  Administered 2018-03-28: 15 ug via INTRATHECAL

## 2018-03-28 MED ORDER — WITCH HAZEL-GLYCERIN EX PADS: 1.0000 "application " | MEDICATED_PAD | CUTANEOUS | Status: DC | PRN

## 2018-03-28 MED ORDER — ONDANSETRON HCL 4 MG/2ML IJ SOLN
4.0000 mg | Freq: Four times a day (QID) | INTRAMUSCULAR | Status: DC | PRN
Start: 2018-03-28 — End: 2018-03-30

## 2018-03-28 SURGICAL SUPPLY — 37 items
BANDAGE GAUZE 4  KLING STR (GAUZE/BANDAGES/DRESSINGS) ×4 IMPLANT
BENZOIN TINCTURE PRP APPL 2/3 (GAUZE/BANDAGES/DRESSINGS) ×2 IMPLANT
CHLORAPREP W/TINT 26ML (MISCELLANEOUS) ×2 IMPLANT
CLAMP CORD UMBIL (MISCELLANEOUS) IMPLANT
CLOTH BEACON ORANGE TIMEOUT ST (SAFETY) ×2 IMPLANT
DRSG OPSITE POSTOP 4X10 (GAUZE/BANDAGES/DRESSINGS) ×2 IMPLANT
ELECT REM PT RETURN 9FT ADLT (ELECTROSURGICAL) ×2
ELECTRODE REM PT RTRN 9FT ADLT (ELECTROSURGICAL) ×1 IMPLANT
EXTRACTOR VACUUM KIWI (MISCELLANEOUS) IMPLANT
EXTRACTOR VACUUM M CUP 4 TUBE (SUCTIONS) IMPLANT
GLOVE BIO SURGEON STRL SZ7 (GLOVE) ×2 IMPLANT
GLOVE BIOGEL PI IND STRL 7.0 (GLOVE) ×2 IMPLANT
GLOVE BIOGEL PI INDICATOR 7.0 (GLOVE) ×2
GOWN STRL REUS W/TWL LRG LVL3 (GOWN DISPOSABLE) ×4 IMPLANT
KIT ABG SYR 3ML LUER SLIP (SYRINGE) IMPLANT
NEEDLE HYPO 25X5/8 SAFETYGLIDE (NEEDLE) IMPLANT
NS IRRIG 1000ML POUR BTL (IV SOLUTION) ×2 IMPLANT
PACK C SECTION WH (CUSTOM PROCEDURE TRAY) ×2 IMPLANT
PAD ABD 7.5X8 STRL (GAUZE/BANDAGES/DRESSINGS) ×2 IMPLANT
PAD OB MATERNITY 4.3X12.25 (PERSONAL CARE ITEMS) ×2 IMPLANT
RTRCTR C-SECT PINK 25CM LRG (MISCELLANEOUS) IMPLANT
STAPLE OSTEOSYNTHE COMPRESSION (GAUZE/BANDAGES/DRESSINGS) ×2 IMPLANT
STAPLER VISISTAT 35W (STAPLE) ×2 IMPLANT
STRIP CLOSURE SKIN 1/2X4 (GAUZE/BANDAGES/DRESSINGS) ×2 IMPLANT
SUT MNCRL 0 VIOLET CTX 36 (SUTURE) ×2 IMPLANT
SUT MONOCRYL 0 CTX 36 (SUTURE) ×2
SUT PLAIN 0 NONE (SUTURE) IMPLANT
SUT PLAIN 2 0 (SUTURE) ×1
SUT PLAIN ABS 2-0 CT1 27XMFL (SUTURE) ×1 IMPLANT
SUT VIC AB 0 CT1 27 (SUTURE) ×2
SUT VIC AB 0 CT1 27XBRD ANBCTR (SUTURE) ×2 IMPLANT
SUT VIC AB 2-0 CT1 27 (SUTURE) ×1
SUT VIC AB 2-0 CT1 TAPERPNT 27 (SUTURE) ×1 IMPLANT
SUT VIC AB 4-0 KS 27 (SUTURE) ×2 IMPLANT
SUT VICRYL 0 TIES 12 18 (SUTURE) IMPLANT
TOWEL OR 17X24 6PK STRL BLUE (TOWEL DISPOSABLE) ×2 IMPLANT
TRAY FOLEY W/BAG SLVR 14FR LF (SET/KITS/TRAYS/PACK) IMPLANT

## 2018-03-28 NOTE — Op Note (Signed)
Cesarean Section Procedure Note   Kristine [name]  03/28/2018  Indications: Cholestasis of pregnanct, prior C-section x 2   Pre-operative Diagnosis: Previous Cesarean Section, Cholestasis of Pregnancy, 37 weeks   Post-operative Diagnosis: Same. Intra-operative neurologic changes (details not available at the end of c-section)  Surgeon: Shea EvansMody, Hollister Wessler, MD - Primary    Amado NashAlmquist, Candace GallusSusan E, MD - Assisting   Anesthesia: spinal   Procedure Details:  The patient was seen in the Holding Room. The risks, benefits, complications, treatment options, and expected outcomes were discussed with the patient. The patient concurred with the proposed plan, giving informed consent. identified as Kristine AngerFarial [place] and the procedure verified as C-Section Delivery.  A Time Out was held and the above information confirmed. 2 gm Ancef given. After induction of spinal anesthesia, the patient was draped and prepped in the usual sterile manner. A Pfannenstiel incision was made via prior scar and carried down through the subcutaneous tissue to the fascia. Fascial incision was made and extended transversely. The fascia was separated from the underlying rectus tissue superiorly and inferiorly. The peritoneum was identified and entered. Peritoneal incision was extended longitudinally. Alexis retractor placed.  The utero-vesical peritoneal reflection was incised transversely and the bladder flap was bluntly freed from the lower uterine segment. A low transverse uterine incision was made. Clear amniotic fluid drained. Baby was delivered from cephalic position at 8.07 am, Apgar scores of 8 at one minute and 9 at five minutes. Weight 5 lb 13 oz. Delayed cord clamping done at 1 minute and baby handed off to NICU team. Cord ph was not sent. Cord blood was obtained for evaluation. The placenta was removed Intact and appeared normal. The uterine outline, tubes and ovaries appeared normal. The uterine incision was closed with running  locked sutures of 0-Monocryl and a second imbricating layer was stitched. Hemostasis was observed. Alexis retractor removed. Peritoneal closure done with 2-0 Vicryl.  This is when patient was talking about shivering, then starts to hyperventilate and then becomes non-verbal and not responding to verbal commands (see other records).  So very quickly fascia was then reapproximated with running sutures of 0Vicryl. The subcuticular closure was performed using 2-0plain gut. The skin was closed STAPLES. Pressure dressing placed over honeycomb dressing.  Instrument, sponge, and needle counts were correct prior the abdominal closure and were correct at the conclusion of the case.    Findings: Baby girl, AGA. No complications. Mother- with acute neurologic changes during surgery.    Estimated Blood Loss: 625 mL   Total IV Fluids: 1000 ml LR  Urine Output: 50CC OF clear urine  Specimens: Cord blood   Complications: neurologic event in OR- unclear diagnosis, code stroke called   Disposition: Post-op stable, unclear neurologist status, but not intubated and stable vitals, [facility]   Maternal Condition: Neuro ICU   Baby condition / location:  Nursery  Attending Attestation: I performed the procedure.   Signed: Surgeon(s): Shea EvansMody, Merie Wulf, MD Vick FreesAlmquist, Susan E, MD

## 2018-03-28 NOTE — Anesthesia Postprocedure Evaluation (Deleted)
Anesthesia Post Note  Patient: Kristine [name]  Procedure(s) Performed: Repeat CESAREAN SECTION (N/A )     Patient location during evaluation: Other Anesthesia Type: Spinal Level of consciousness: obtunded/minimal responses Pain control: could not assess. Vital Signs Assessment: post-procedure vital signs reviewed and stable Respiratory status: non-rebreather facemask and spontaneous breathing Cardiovascular status: stable : not able to assess. Anesthetic complications: yes Anesthetic complication details: anesthesia complicationsComments: Please see intra-operative record. Pt became unresponsive to verbal and painful stimuli. Pt remained hemodynamically    Last Vitals:  Vitals:   03/28/18 1400 03/28/18 1500  BP: 103/65 95/67  Pulse: 71 72  Resp: (!) 21 20  Temp:    SpO2: 100% 99%    Last Pain:  Vitals:   03/28/18 1200  TempSrc:   PainSc: 0-No pain   Pain Goal:                 Chelsey L Woodrum

## 2018-03-28 NOTE — H&P (Signed)
Chief complaint: Transfer back from neuro ICU after neurologic workup following intraoperative neurologic event  History of present illness: 37 year old G3 P3 who presented today for scheduled repeat cesarean section. Patient was delivered at 37 weeks 4 small for gestational age with cholestasis. Patient did not have diagnosis of preeclampsia. During routine cesarean section patient had neurologic symptoms consistent with CVA. Patient was transferred to the neuro ICU where per report she had a negative CT angiogram and a negative EEG. Discharge summary from neuro ICU is not available at this time. Per verbal report working diagnosis is conversion disorder.  Patient currently reports strong headache. Patient states headache got slightly better after eating some chicken soup earlier and she is sitting with a full tray and a Coke in front of her. Patient denies vision change, right upper quadrant pain. Patient does note normal strength on her left side but notes right-sided arm weakness andnumbness.  Physical exam: Vitals:   03/28/18 1900 03/28/18 2000 03/28/18 2100 03/28/18 2157  BP: 98/66 97/62 100/62 90/67  Pulse: 70 84 73 75  Resp: (!) 22 19 (!) 26   Temp:  98.6 F (37 C)  98.3 F (36.8 C)  TempSrc:  Oral  Oral  SpO2: 100% 100% 100% 100%  Weight:      Height:       General: Appears comfortable in bed, no gross neurologic defects are noted. HEENT: Symmetric facial movements Cardiovascular: 2/6 systolic murmur, no rub, no gallop, normal rate Pulmonary: Clear to auscultation bilaterally Abdomen: No right upper quadrant pain, fundus below the umbilicus, nontender, no rebound, no guarding, no tympany, no distention Incision: Dressed, no staining GU: Deferred Lower extremity: SCDs on but not hooked up, no clonus, 1+ DTR, no edema  Assessment and plan: 37 year old G3 P3 postop day 0 status post repeat cesarean section with on clear neurologic event intraoperatively with negative CT scan,  negative EEG and unclear final diagnosis for her symptoms. Patient currently with headache and will try Tylenol food and rest along with her caffeine. Patient was cleared from neurology and ICU [facility] with no acute abnormalities. Plan is for outpatient neurologic the follow-up in 4-6 weeks. Tomorrow will discuss with patient possibility of conversion disorder and can consider psychiatric consultation. -No current symptoms of cholestasis. This does put patient at risk of preeclampsia. Patient had no labs or blood pressure abnormalities which would be consistent with preeclamptic seizure. Patient has not been given magnesium for seizure prophylaxis and will hold off on this at this time.  Lendon ColonelKelly A Yianna Tersigni 03/28/2018 10:46 PM

## 2018-03-28 NOTE — Progress Notes (Signed)
Patient ID: Kristine [name], female   DOB: August 06, 1981, 37 y.o.   MRN: [id number] Pt seen in Neuro ICU. Mother-in-law with her.  Pt responded to verbal command. She reports she does not recall any events after feeling " she was passing out in the OR" . Her speech is clear but reports being sleepy and dizzy. Does not have any appetite.  No SOB/ CP.  VS stable. BP 95/67   Pulse 72   Temp 98.5 F (36.9 C) (Oral)   Resp 20   Ht 5\' 3"  (1.6 m)   Wt 74.3 kg   LMP 06/08/2017 (Exact Date)   SpO2 99%   Breastfeeding? Unknown   BMI 29.03 kg/m  Abdomen soft, fundus firm 2 cm below umbilicus, no guarding/ rebound. Dressing dry.  Reviewed with patient and mother-in-law OR events, labs (normal), imaging (normal). EEG final report pending.   If remains stable and cleared by Neuro, [facility]  Pt may eat. RN informed.   V.Kenita Bines MD

## 2018-03-28 NOTE — Progress Notes (Signed)
EEG Completed; Results Pending  

## 2018-03-28 NOTE — Anesthesia Procedure Notes (Signed)
Arterial Line Insertion Start/End8/16/2019 8:44 AM Performed by: Mal AmabileFoster, [name], MD, anesthesiologist  Preanesthetic checklist: patient identified Left, radial was placed Catheter size: 20 G Hand hygiene performed  and maximum sterile barriers used   Attempts: 1 Procedure performed without using ultrasound guided technique. Following insertion, dressing applied and Biopatch. Post procedure assessment: normal  Patient tolerated the procedure well with no immediate complications.

## 2018-03-28 NOTE — Anesthesia Procedure Notes (Addendum)
Arterial Line Insertion Start/End8/16/2019 8:20 AM, 03/28/2018 8:30 AM Performed by: Elmer PickerWoodrum, Camrin Gearheart L, MD, anesthesiologist  Patient location: OR. Preanesthetic checklist: patient identified, IV checked, site marked, risks and benefits discussed, surgical consent, monitors and equipment checked, pre-op evaluation, timeout performed and anesthesia consent Emergency situation Left, radial was placed Catheter size: 20 G  Attempts: 1 Procedure performed without using ultrasound guided technique. Following insertion, dressing applied and Biopatch. Post procedure assessment: normal  Patient tolerated the procedure well with no immediate complications.

## 2018-03-28 NOTE — Procedures (Signed)
ELECTROENCEPHALOGRAM REPORT   Patient: Kristine [name]       Room #: 4N23C EEG No. ID: [id number] Age: 37 y.o.        Sex: female Referring Physician: Aror Report Date:  03/28/2018        Interpreting Physician: Thana FarrEYNOLDS, Thurmond Hildebran  History: Kristine [name] is an 37 y.o. female with seizure-like activity  Medications:  Percocet  Conditions of Recording:  This is a 21 channel routine scalp EEG performed with bipolar and monopolar montages arranged in accordance to the international 10/20 system of electrode placement. One channel was dedicated to EKG recording.  The patient is in the awake state.  Description:  The waking background activity consists of a low voltage, symmetrical, fairly well organized, 9 Hz alpha activity, seen from the parieto-occipital and posterior temporal regions.  Low voltage fast activity, poorly organized, is seen anteriorly and is at times superimposed on more posterior regions.  A mixture of theta and alpha rhythms are seen from the central and temporal regions. The patient does not drowse or sleep. No epileptiform activity is noted.   Hyperventilation and intermittent photic stimulation were not performed.  IMPRESSION: This is a normal awake electroencephalogram.  There are no focal lateralizing or epileptiform features.   Comment:  An EEG with the patient sleep deprived to elicit drowse and light sleep may be desirable to further elicit a possible seizure disorder.     Thana FarrLeslie Angelisa Winthrop, MD Neurology [phone] 03/28/2018, 5:30 PM

## 2018-03-28 NOTE — Anesthesia Preprocedure Evaluation (Addendum)
Anesthesia Evaluation  Patient identified by MRN, date of birth, ID band Patient awake    Reviewed: Allergy & Precautions, NPO status , Patient's Chart, lab work & pertinent test results  Airway Mallampati: II  TM Distance: >3 FB Neck ROM: Full    Dental no notable dental hx. (+) Dental Advisory Given, Teeth Intact   Pulmonary neg pulmonary ROS,    Pulmonary exam normal breath sounds clear to auscultation       Cardiovascular negative cardio ROS Normal cardiovascular exam Rhythm:Regular Rate:Normal     Neuro/Psych Depression negative neurological ROS     GI/Hepatic negative GI ROS, Neg liver ROS,   Endo/Other  negative endocrine ROS  Renal/GU negative Renal ROS  negative genitourinary   Musculoskeletal negative musculoskeletal ROS (+)   Abdominal   Peds  Hematology  (+) anemia ,   Anesthesia Other Findings   Reproductive/Obstetrics (+) Pregnancy (2 previous c/s)                            Anesthesia Physical Anesthesia Plan  ASA: II  Anesthesia Plan: Spinal   Post-op Pain Management:    Induction:   PONV Risk Score and Plan: 2 and Treatment may vary due to age or medical condition  Airway Management Planned: Natural Airway  Additional Equipment:   Intra-op Plan:   Post-operative Plan:   Informed Consent: I have reviewed the patients History and Physical, chart, labs and discussed the procedure including the risks, benefits and alternatives for the proposed anesthesia with the patient or authorized representative who has indicated his/her understanding and acceptance.   Dental advisory given  Plan Discussed with: CRNA  Anesthesia Plan Comments: (Patient identified. Risks, benefits, options discussed with patient including but not limited to bleeding, infection, nerve damage, paralysis, failed block, incomplete pain control, headache, blood pressure changes, nausea,  vomiting, reactions to medication, itching, and post partum back pain. Confirmed with bedside nurse the patient's most recent platelet count. Confirmed with the patient that they are not taking any anticoagulation, have any bleeding history or any family history of bleeding disorders. Patient expressed understanding and wishes to proceed. All questions were answered. )        Anesthesia Quick Evaluation

## 2018-03-28 NOTE — Transfer of Care (Signed)
Immediate Anesthesia Transfer of Care Note  Patient: Kristine [name]  Procedure(s) Performed: Repeat CESAREAN SECTION (N/A )  Patient Location: OR 9 to [facility]  Anesthesia Type:Spinal  Level of Consciousness: sedated, responds to stimulation and obtunded  Airway & Oxygen Therapy: Patient Spontanous Breathing and Patient connected to face mask oxygen  Post-op Assessment: Report given to RN and Post -op Vital signs reviewed and stable  Post vital signs: Reviewed and stable  Last Vitals:  Vitals Value Taken Time  BP    Temp    Pulse    Resp    SpO2      Last Pain:  Vitals:   03/28/18 0555  TempSrc: Oral  PainSc: 0-No pain         Complications: No apparent anesthesia complications and unexpected post-op hospitalization

## 2018-03-28 NOTE — Code Documentation (Signed)
37yo female arriving to [facility] via [place] at [phone]. Patient from [facility] where she was undergoing cesarean section when she had reported sudden onset left gaze and right arm shaking/weakness. Code stroke activated and patient transferred to [facility]. Patient met on arrival by the stroke team. Patient transported directly to CT for CT and CTA. Imaging completed. NIHSS 18, see documentation for details and code stroke times. Patient mute, however following simple commands. Patient without gaze preference, blinks to threat bilaterally and moving bilateral upper extremities (right weaker than left). Patient received spinal anesthesia with no movement in bilateral lower extremities. Patient is contraindicated for tPA d/t cesarean section today. Patient is not an endovascular intervention candidate d/t CTA negative for LVO. Patient transferred to 4N ICU via [place] and stroke team. Bedside handoff with Jerene Pitch, Therapist, sports.

## 2018-03-28 NOTE — Consult Note (Addendum)
NEURO HOSPITALIST    Requesting Physician: Dr. Juliene PinaMody    Chief Complaint: right side flaccid, left gaze deviation  History obtained from:  Chart  HPI:                                                                                                                                         Kristine [name] is an 37 y.o. female  With PMH of previous c- section, depression, IDA who [facility]p repeat c-section as a code stroke.  [facility] under spinal anesthesia.  This is her third C-section.  Approximately 5 minutes after she began having shivers, and she verbalized that " she was not cold, she did not know why she was shaking". Per note she then started to hyperventilate, her eyes rolled back and she was no longer responding to verbal commands.  Her husband was also at bedside and he reports that she reported her complaint of being cold and then started shaking.  Unclear that has any bearing on the presentation. Going to the husband, her eyes then started becoming crazy and going all over the place. According to the anesthesia call received, the patient was gazing to the left with inability to move the right arm and was completely nonverbal. Code stroke was activated and I spoke with the anesthetics over the phone. Given the lack of availability of [facility] as well as possible LVO based on clinical exam, decision was made to emergently transfer the patient to [facility].  Critical care services accepted the patient to be transferred to the neurological ICU. She was seen at the ER bridge, assessed.  Exam as below.  Noncontrast CT of the head and CTA were negative.  ED course: NA: 134, BG: 117, creatinine: 0.41, ca: 8.0,albumin: 2.6, total protein: 5.6. C02:17 CT head: Negative head CT CTA: Negative CTA for large vessel occlusion, atherosclerosis, or arterial  abnormality in the head and neck. Abnormal lungs with dependent opacity and septal thickening which may reflect combination of pulmonary edema, aspiration and/or atelectasis.  No previous stroke history noted.   Patient has not had similar symptoms in the past.  No history of seizures or stroke.  No history of elevated blood pressure during the pregnancy or around the time of delivery.  She is a CNA at [employer].  She has a documented history of major depression, weight for which she was on some medications but has not been taking her medications for a while as she did not have ongoing problems per her husband and mother-in-law  were at bedside.  She is not on benzodiazepines currently or in the past.   Date last known well: Date: 03/28/2018 Time last known well: Unable to determine tPA Given: No: recent c- section Modified Rankin: Rankin Score=0  NIHSS:18  Past Medical History:  Diagnosis Date  . Acute blood loss anemia 02/21/2013  . Anemia, iron deficiency 02/21/2013  . Cholestasis during pregnancy in second trimester 01/28/2018  . Constipation, chronic 12/12/2012  . Depression   . Previous cesarean delivery, antepartum condition or complication 11/22/2016  . Vitamin D deficiency, unspecified     Past Surgical History:  Procedure Laterality Date  . CESAREAN SECTION N/A 02/20/2013   Procedure: CESAREAN SECTION;  Surgeon: Robley Fries, MD;  Location: [facility] ORS;  Service: Obstetrics;  Laterality: N/A;  . CESAREAN SECTION N/A 11/22/2016   Procedure: REPEAT CESAREAN SECTION;  Surgeon: Shea Evans, MD;  Location: [facility] BIRTHING SUITES;  Service: Obstetrics;  Laterality: N/A;  EDD: 11/29/16    Family History  Problem Relation Age of Onset  . Hypertension Mother   . Cancer Mother        uterus  . Hypertension Paternal Uncle   . Hypothyroidism Sister   . Other Neg Hx   . Hearing loss Neg Hx   . Alcohol abuse Neg Hx   . Arthritis Neg Hx   . Asthma Neg Hx   . Birth defects Neg  Hx   . COPD Neg Hx   . Depression Neg Hx   . Diabetes Neg Hx   . Drug abuse Neg Hx   . Early death Neg Hx   . Heart disease Neg Hx   . Hyperlipidemia Neg Hx   . Kidney disease Neg Hx   . Learning disabilities Neg Hx   . Mental illness Neg Hx   . Mental retardation Neg Hx   . Miscarriages / Stillbirths Neg Hx   . Stroke Neg Hx   . Vision loss Neg Hx    Social History:  reports that she has never smoked. She has never used smokeless tobacco. She reports that she does not drink alcohol or use drugs.  Allergies: No Known Allergies  Medications:                                                                                                                          Scheduled: Continuous: PRN:  ROS:  History obtained from unobtainable from patient due to mental status General Examination:                                                                                                      Blood pressure 116/73, pulse 81, temperature 97.6 F (36.4 C), temperature source Oral, resp. rate (!) 23, height 5\' 3"  (1.6 m), weight 74.3 kg, last menstrual period 06/08/2017, SpO2 100 %, unknown if currently breastfeeding.  HEENT-  Normocephalic, no lesions, without obvious abnormality.  Normal external eye and conjunctiva.  Cardiovascular- , pulses palpable throughout   Lungs- no excessive working breathing.  Saturations within normal limits on 2 L [place] Extremities- Warm, dry and intact Musculoskeletal-no joint tenderness, deformity or swelling Skin-warm and dry, edematous  Neurological Examination Mental Status: Patient awake, eyes open. Not yet speaking, but mental status improving since arrival. Cranial Nerves: Blinks to threat.  EOMI. PERRLA  Motor/ Sensory: Spinal block from c-section Right : Upper extremity   4/5    Left:     Upper extremity   4/5  Lower  extremity   0/5     Lower extremity   0/5   Able to withdraw bilateral upper extremities to noxious.    Lab Results: Basic Metabolic Panel: Recent Labs  Lab 03/27/18 1220 03/28/18 0845  NA 135 134*  K 4.1 4.0  CL 103 105  CO2 22 17*  GLUCOSE 113* 117*  BUN 7 6  CREATININE 0.40* 0.41*  CALCIUM 9.0 8.0*    CBC: Recent Labs  Lab 03/27/18 1220 03/28/18 0845  WBC 9.0 11.1*  HGB 11.4* 10.5*  HCT 33.1* 30.0*  MCV 94.6 93.8  PLT 229 212   Imaging: CTH- no acute changes CTA/P no LVO   Valentina Lucks, MSN, NP-C [facility] [phone]  03/28/2018, 10:33 AM   Attending physician note to follow with Assessment and plan .  Attending Neurohospitalist Addendum Patient seen and examined with APP/Resident. Agree with the history and physical as documented above. Agree with the plan as documented, which I helped formulate. I have independently reviewed the chart, obtained history, review of systems and examined  Noncontrast CT of the head unremarkable for acute change.  CT Angie head and neck unremarkable.   Assessment: 37 y.o. female With PMH of previous c- section, depression, IDA who [facility]p c-section as a code stroke She was noted to have left-sided gaze deviation with right-sided weakness according to the initial examination in the OR. Symptoms consistent with stroke based on description on the phone call. Due to the lack of availability of stat CT scans at [facility], decision made to initiate transfer to [facility] for further imaging and emergent stroke evaluation. Patient was evaluated as soon as she arrived to [facility].  She was taken in for the CT scans-results as above. Her exam has started to improve ever since on arrival to [facility]. Differentials at this point include: -Less likely stroke - Adverse effect/response to medications - Less likely seizure - Consider conversion disorder if all work up is  unremarkable, given rapid symptoms improvement, inconsistent exam and h/o depression  Stroke Risk Factors - none   Recommendations -Obtain routine EEG -Frequent neurochecks - Postop care per anesthesia and OB. -Appreciate PCCM assistance and general medical management. -If the EEG is unremarkable and the patient's neurological exam is at baseline, no further neurological recommendations at that time.   I relayed my plan over the phone to Dr. Juliene PinaMody, the patient[facility] primary OB [facility].  -- Milon DikesAshish Charnele Semple, MD [facility] Pager: [phone] If 7pm to 7am, please call on call as listed on AMION.  CRITICAL CARE ATTESTATION This patient is critically ill and at significant risk of neurological worsening, death and care requires constant monitoring of vital signs, hemodynamics,respiratory and cardiac monitoring. I spent 45  minutes of neurocritical care time performing neurological assessment, discussion with family, other specialists and medical decision making of high complexityin the care of  this patient.

## 2018-03-28 NOTE — Anesthesia Procedure Notes (Signed)
Spinal  Patient location during procedure: OR Start time: 03/28/2018 7:27 AM End time: 03/28/2018 7:37 AM Staffing Anesthesiologist: Elmer PickerWoodrum, Orabelle Rylee L, MD Performed: anesthesiologist  Preanesthetic Checklist Completed: patient identified, surgical consent, pre-op evaluation, timeout performed, IV checked, risks and benefits discussed and monitors and equipment checked Spinal Block Patient position: sitting Prep: site prepped and draped and DuraPrep Patient monitoring: cardiac monitor, continuous pulse ox and blood pressure Approach: midline Location: L3-4 Injection technique: single-shot Needle Needle type: Pencan  Needle gauge: 24 G Needle length: 9 cm Needle insertion depth: 5 cm Assessment Sensory level: T6 Additional Notes Functioning IV was confirmed and monitors were applied. Sterile prep and drape, including hand hygiene and sterile gloves were used. The patient was positioned and the spine was prepped. The skin was anesthetized with lidocaine.  Free flow of clear CSF was obtained prior to injecting local anesthetic into the CSF.  The spinal needle aspirated freely following injection.  The needle was carefully withdrawn.  The patient tolerated the procedure well.

## 2018-03-28 NOTE — Progress Notes (Addendum)
Interim progress note  EEG completed and reported normal. Exam much improved and back to baseline. Complains of some dyspnea while she tries to talk, but no focal CN, motor, sensory or cerebellar deficits.   Impression: Likely adverse reaction to medication versus possible seizure Conversion disorder not entirely ruled out  Recommendation: -No further neurological recommendations -follow-up with outpatient neurology 4 to 6 weeks after discharge - [facility]. - [facility] with questions as needed.  Refer to schedule on [email or web address] for on-call neuro hospitalist.  Plan relayed to the ICU attending Dr. Wallace CullensGray via phone.  -- Milon DikesAshish Maven Rosander, MD [facility] Pager: [phone]

## 2018-03-28 NOTE — Progress Notes (Signed)
Chaplain was referred to Patient from [facility]. Kristine Baleshaplain Kristine [name] contacted Chaplains covering Kristine [name] on Friday 16 August and asked if we would get by to see Kristine [name]. Patient was resting at time of visit. Chaplain spoke with Pt.'s attending nurse. All are thankful that after testing and observation it has been determined that she did not have a stroke. Patient is well [facility].  Chaplain will brief Chaplain coming on at 1700 - in my assessment a check in visit sometime this weekend is warranted.  See how Mom is feeling and coping with two major significant events.  Birth of child and subsequent emergent health event.   - chaplain Annastacia Duba.

## 2018-03-28 NOTE — Progress Notes (Signed)
Patient ID: Cassandria AngerFarial Dejonge, female   DOB: 1981-03-07, 37 y.o.   MRN: [id number] Brief note In Repeat C.section under spinal anesthesia. Baby delivered and was handed off to NICU team. At least 5 minutes after that she was having shivers, common for regional anesthesia and she verbalized stating "I am not cold I don't why I am shaking". The she started to hyperventilate and I noted her eyes rolled back and she was not responding yo verbal commands. We had baby and dad move to nursery and C/section closure done quickly, skin closed with staples.  Surgery was uncomplicated, only drain is foley.  VS remained stable, she didn't need cardio-respiratory support. She received Versed to prevent seizure in case this was a seizure, though no tonic clonic event noted in upper extremities.  Anesthesia started art-line, another venous line.  ABG, CBC, CMP, coags normal.   Medical Hx- Anxiety, Cholestasis of pregnancy in this pregnancy- on Actigall, no other meds. 3rd C/section, prior 2 uneventful. No HTN or preeclampsia history. No seizure disorder history. No DM.   Transferring to [facility] ER and then CT head, then Neuro ICU  V.Anora Schwenke MD ObGyn [phone]

## 2018-03-28 NOTE — Anesthesia Postprocedure Evaluation (Signed)
Anesthesia Post Note  Patient: Kristine [name]  Procedure(s) Performed: Repeat CESAREAN SECTION (N/A )     Patient location during evaluation: Other Anesthesia Type: Spinal Level of consciousness: obtunded/minimal responses Pain control: not able to assess. Vital Signs Assessment: post-procedure vital signs reviewed and stable Respiratory status: spontaneous breathing Cardiovascular status: stable : unable to assess. Anesthetic complications: yes Anesthetic complication details: anesthesia complicationsComments: Please see intraoperative documentation regarding intraoperative events. Unplanned transfer to [facility] for rule out stroke.    Last Vitals:  Vitals:   03/28/18 1800 03/28/18 1900  BP: 99/69 98/66  Pulse: 95 70  Resp: (!) 21 (!) 22  Temp:    SpO2: 100% 100%    Last Pain:  Vitals:   03/28/18 1610  TempSrc: Oral  PainSc:                  Opha Mcghee L Lella Mullany

## 2018-03-28 NOTE — Progress Notes (Signed)
Dr. Wallace CullensGray spoke with Dr. Juliene PinaMody regarding [facility]. Per Dr. Wallace CullensGray, both MDs agreed it was best for pt to transfer back.

## 2018-03-28 NOTE — Consult Note (Signed)
PULMONARY / CRITICAL CARE MEDICINE   Name: Kristine [name] MRN: [id number] DOB: Aug 01, 1981    ADMISSION DATE:  03/28/2018 CONSULTATION DATE: 03/28/2018  REFERRING MD: Neuro  CHIEF COMPLAINT: Altered mental status  HISTORY OF PRESENT ILLNESS:    37 year old female who presented to [facility] on 03/28/2018 for her third C-section.  She has 2 previous C-sections with healthy children.  Her pregnancy as complicated by cholestasis for which she takes Actigall. During the C-section the following delivery she was noted to be shivering and were responded" I am not cold I do not know why I am shivering" then proceeded to roll her eyes back in her head had a left gaze preference and right side weakness. She was designated code stroke.  Given Versed for preventative seizure treatment.  Transferred to [facility] emergency department where she was met by neurology.  She underwent CT of the head which did not demonstrate any acute process.  Her gaze preference and right side weakness had resolved.  Is extremely lethargic and poorly responsive after having received Versed but was able to follow commands and move all extremities x4. Pulmonary critical care was called to the bedside to be involved in her care. Note she has a history of depression for which she was on Zoloft up into the third trimester.  She has PRN Procardia for contractions that were ordered prior to admission.   PAST MEDICAL HISTORY :  She  has a past medical history of Acute blood loss anemia (02/21/2013), Anemia, iron deficiency (02/21/2013), Cholestasis during pregnancy in second trimester (01/28/2018), Constipation, chronic (12/12/2012), Depression, Previous cesarean delivery, antepartum condition or complication (8/67/6720), and Vitamin D deficiency, unspecified.  PAST SURGICAL HISTORY: She  has a past surgical history that includes Cesarean section (N/A, 02/20/2013) and Cesarean section (N/A, 11/22/2016).  No Known  Allergies  No current facility-administered medications on file prior to encounter.    Current Outpatient Medications on File Prior to Encounter  Medication Sig  . acetaminophen (TYLENOL) 325 MG tablet Take 325 mg by mouth every 6 (six) hours as needed for moderate pain or headache.   . hydrocortisone 2.5 % cream Apply 1 application topically daily as needed for hemorrhoids.  . lidocaine (XYLOCAINE) 5 % ointment Apply 1 application topically daily as needed for hemorrhoids.  Marland Kitchen NIFEdipine (PROCARDIA) 10 MG capsule Take 10 mg by mouth 2 (two) times daily as needed (contractions).   . Prenatal Multivit-Min-Fe-FA (PRENATAL VITAMINS PO) Take 1 tablet by mouth daily.   . ursodiol (ACTIGALL) 300 MG capsule Take 300 mg by mouth 3 (three) times daily.    FAMILY HISTORY:  Her family history includes Cancer in her mother; Hypertension in her mother and paternal uncle; Hypothyroidism in her sister. There is no history of Other, Hearing loss, Alcohol abuse, Arthritis, Asthma, Birth defects, COPD, Depression, Diabetes, Drug abuse, Early death, Heart disease, Hyperlipidemia, Kidney disease, Learning disabilities, Mental illness, Mental retardation, Miscarriages / Stillbirths, Stroke, or Vision loss.  SOCIAL HISTORY: She  reports that she has never smoked. She has never used smokeless tobacco. She reports that she does not drink alcohol or use drugs.  REVIEW OF SYSTEMS:   Not available  SUBJECTIVE:  37 year old female with obvious change in mentation but is been given Versed.  VITAL SIGNS: BP 116/73 (BP Location: Right Arm)   Pulse 81   Temp 98.5 F (36.9 C) (Oral)   Resp (!) 23   Ht '5\' 3"'  (1.6 m)   Wt 74.3 kg   LMP 06/08/2017 (  Exact Date)   SpO2 100%   Breastfeeding? Unknown   BMI 29.03 kg/m   HEMODYNAMICS:    VENTILATOR SETTINGS:    INTAKE / OUTPUT: No intake/output data recorded.  PHYSICAL EXAMINATION: General: Well-nourished well-developed female in no acute distress Neuro:  Very lethargic, opens eyes follows commands to accept tone, grips with both hands equally although weakly.  Wiggles toes.  Nonverbal HEENT: No JVD lymphadenopathy is appreciated Cardiovascular: Heart sounds are regular regular rate and rhythm Lungs: Decreased breath sounds throughout Abdomen: Distended, surgical site intact.  No reported vaginal bleeding Musculoskeletal: Intact Skin:  warm  LABS:  BMET Recent Labs  Lab 03/27/18 1220 03/28/18 0845  NA 135 134*  K 4.1 4.0  CL 103 105  CO2 22 17*  BUN 7 6  CREATININE 0.40* 0.41*  GLUCOSE 113* 117*    Electrolytes Recent Labs  Lab 03/27/18 1220 03/28/18 0845  CALCIUM 9.0 8.0*    CBC Recent Labs  Lab 03/27/18 1220 03/28/18 0845  WBC 9.0 11.1*  HGB 11.4* 10.5*  HCT 33.1* 30.0*  PLT 229 212    Coag's Recent Labs  Lab 03/28/18 0845  INR 0.97    Sepsis Markers No results for input(s): LATICACIDVEN, PROCALCITON, O2SATVEN in the last 168 hours.  ABG Recent Labs  Lab 03/28/18 0843  PHART 7.352  PCO2ART 35.4  PO2ART 335*    Liver Enzymes Recent Labs  Lab 03/27/18 1220 03/28/18 0845  AST 14* 15  ALT 11 9  ALKPHOS 127* 105  BILITOT 1.1 0.7  ALBUMIN 3.1* 2.6*    Cardiac Enzymes No results for input(s): TROPONINI, PROBNP in the last 168 hours.  Glucose Recent Labs  Lab 03/28/18 0833  GLUCAP 118*    Imaging Ct Angio Head W Or Wo Contrast  Result Date: 03/28/2018 CLINICAL DATA:  37 year old female code stroke. Right facial droop, altered mental status status post C-section. EXAM: CT ANGIOGRAPHY HEAD AND NECK TECHNIQUE: Multidetector CT imaging of the head and neck was performed using the standard protocol during bolus administration of intravenous contrast. Multiplanar CT image reconstructions and MIPs were obtained to evaluate the vascular anatomy. Carotid stenosis measurements (when applicable) are obtained utilizing NASCET criteria, using the distal internal carotid diameter as the denominator.  CONTRAST:  24m ISOVUE-370 IOPAMIDOL (ISOVUE-370) INJECTION 76% COMPARISON:  Head CT without contrast 1007 hours today. FINDINGS: CTA NECK Skeleton: No acute osseous abnormality identified. Paranasal sinuses and mastoids are stable and well pneumatized. Upper chest: Confluent dependent bilateral upper lung opacity with superimposed diffuse pulmonary septal thickening. The visible Major airways are patent. No pleural effusion. No superior mediastinal lymphadenopathy. Other neck: Negative.  No neck mass or lymphadenopathy. Aortic arch: 4 vessel arch configuration, the left vertebral artery arises directly from the arch. No arch or great vessel origin atherosclerosis. Right carotid system: Negative. Left carotid system: Negative. Vertebral arteries: No proximal right subclavian artery or right vertebral artery origin plaque or stenosis. The right vertebral artery appears mildly dominant and is patent, normal to the skull base. The left vertebral artery arises directly from the arch. The origin and left V1 segment are patent and normal. The vessel has a late entry into the cervical transverse foramen and is mildly non dominant. The left vertebral artery is patent to the skull base without abnormality. CTA HEAD Posterior circulation: Mildly dominant right vertebral artery. Normal V4 segments, PICA origins, and vertebrobasilar junction. Patent basilar artery without stenosis. Patent AICA, SCA and PCA origins. The left posterior communicating artery is present, diminutive. The right  is smaller and/or absent. Bilateral PCA branches are within normal limits. Anterior circulation: Both ICA siphons are patent with no atherosclerosis or stenosis. Normal ophthalmic and left posterior communicating artery origins. Patent carotid termini. Normal MCA and ACA origins. The distal right A1 segment is fenestrated, normal variant. The left A1 and anterior communicating arteries are within normal limits. The right A2 segment is mildly  dominant. Bilateral ACA branches are within normal limits. Right MCA M1 segment, bifurcation, and right MCA branches are within normal limits. Left MCA M1 segment, bifurcation, and left MCA branches are patent and appear normal. Venous sinuses: Patent. Anatomic variants: Left vertebral artery arises directly from the arch. Mildly dominant right vertebral artery. Fenestrated right ACA A1 segment. Review of the MIP images confirms the above findings IMPRESSION: 1. Negative CTA for large vessel occlusion, atherosclerosis, or arterial abnormality in the head and neck. 2. Abnormal lungs with dependent opacity and septal thickening which may reflect combination of pulmonary edema, aspiration and/or atelectasis. 3. These results were communicated to Dr. Rory Percy at 10:39 amon 8/16/2019by text page via the [facility] messaging system. Electronically Signed   By: Genevie Ann M.D.   On: 03/28/2018 10:40   Ct Angio Neck W Or Wo Contrast  Result Date: 03/28/2018 CLINICAL DATA:  37 year old female code stroke. Right facial droop, altered mental status status post C-section. EXAM: CT ANGIOGRAPHY HEAD AND NECK TECHNIQUE: Multidetector CT imaging of the head and neck was performed using the standard protocol during bolus administration of intravenous contrast. Multiplanar CT image reconstructions and MIPs were obtained to evaluate the vascular anatomy. Carotid stenosis measurements (when applicable) are obtained utilizing NASCET criteria, using the distal internal carotid diameter as the denominator. CONTRAST:  45m ISOVUE-370 IOPAMIDOL (ISOVUE-370) INJECTION 76% COMPARISON:  Head CT without contrast 1007 hours today. FINDINGS: CTA NECK Skeleton: No acute osseous abnormality identified. Paranasal sinuses and mastoids are stable and well pneumatized. Upper chest: Confluent dependent bilateral upper lung opacity with superimposed diffuse pulmonary septal thickening. The visible Major airways are patent. No pleural effusion. No superior  mediastinal lymphadenopathy. Other neck: Negative.  No neck mass or lymphadenopathy. Aortic arch: 4 vessel arch configuration, the left vertebral artery arises directly from the arch. No arch or great vessel origin atherosclerosis. Right carotid system: Negative. Left carotid system: Negative. Vertebral arteries: No proximal right subclavian artery or right vertebral artery origin plaque or stenosis. The right vertebral artery appears mildly dominant and is patent, normal to the skull base. The left vertebral artery arises directly from the arch. The origin and left V1 segment are patent and normal. The vessel has a late entry into the cervical transverse foramen and is mildly non dominant. The left vertebral artery is patent to the skull base without abnormality. CTA HEAD Posterior circulation: Mildly dominant right vertebral artery. Normal V4 segments, PICA origins, and vertebrobasilar junction. Patent basilar artery without stenosis. Patent AICA, SCA and PCA origins. The left posterior communicating artery is present, diminutive. The right is smaller and/or absent. Bilateral PCA branches are within normal limits. Anterior circulation: Both ICA siphons are patent with no atherosclerosis or stenosis. Normal ophthalmic and left posterior communicating artery origins. Patent carotid termini. Normal MCA and ACA origins. The distal right A1 segment is fenestrated, normal variant. The left A1 and anterior communicating arteries are within normal limits. The right A2 segment is mildly dominant. Bilateral ACA branches are within normal limits. Right MCA M1 segment, bifurcation, and right MCA branches are within normal limits. Left MCA M1 segment, bifurcation, and left  MCA branches are patent and appear normal. Venous sinuses: Patent. Anatomic variants: Left vertebral artery arises directly from the arch. Mildly dominant right vertebral artery. Fenestrated right ACA A1 segment. Review of the MIP images confirms the above  findings IMPRESSION: 1. Negative CTA for large vessel occlusion, atherosclerosis, or arterial abnormality in the head and neck. 2. Abnormal lungs with dependent opacity and septal thickening which may reflect combination of pulmonary edema, aspiration and/or atelectasis. 3. These results were communicated to Dr. Rory Percy at 10:39 amon 8/16/2019by text page via the [facility] messaging system. Electronically Signed   By: Genevie Ann M.D.   On: 03/28/2018 10:40   Ct Head Code Stroke Wo Contrast  Result Date: 03/28/2018 CLINICAL DATA:  Code stroke. Postop right facial droop with left-sided gaze. EXAM: CT HEAD WITHOUT CONTRAST TECHNIQUE: Contiguous axial images were obtained from the base of the skull through the vertex without intravenous contrast. COMPARISON:  None. FINDINGS: Brain: No evidence of acute infarction, hemorrhage, hydrocephalus, extra-axial collection or mass lesion/mass effect. Vascular: No hyperdense vessel or unexpected calcification. Skull: Normal. Negative for fracture or focal lesion. Sinuses/Orbits: No acute finding. Other: These results were communicated to Dr. Rory Percy at 10:10 amon 8/16/2019by text page via the [facility] messaging system. ASPECTS [facility] Stroke Program Early CT Score) - Ganglionic level infarction (caudate, lentiform nuclei, internal capsule, insula, M1-M3 cortex): 7 - Supraganglionic infarction (M4-M6 cortex): 3 Total score (0-10 with 10 being normal): 10 IMPRESSION: Negative head CT.  ASPECTS is 10. Electronically Signed   By: Monte Fantasia M.D.   On: 03/28/2018 10:11     STUDIES:  03/28/2018 CT of the head essentially negative  CULTURES:   ANTIBIOTICS:   SIGNIFICANT EVENTS: 03/28/2018 C-section with live fetus 03/28/2018 acute neurological event that included left gaze preference right-sided weakness.  LINES/TUBES: None  DISCUSSION:  37 year old female who presented to [facility] on 03/28/2018 for her third C-section.  She has 2 previous C-sections with healthy  children.  Her pregnancy as complicated by cholestasis for which she takes Actigall. During the C-section the following delivery she was noted to be shivering and were responded" I am not cold I do not know why I am shivering" then proceeded to roll her eyes back in her head had a left gaze preference and right side weakness. She was designated code stroke.  Given Versed for preventative seizure treatment.  Transferred to [facility] emergency department where she was met by neurology.  She underwent CT of the head which did not demonstrate any acute process.  Her gaze preference and right side weakness had resolved.  Is extremely lethargic and poorly responsive after having received Versed but was able to follow commands and move all extremities x4. Pulmonary critical care was called to the bedside to be involved in her care. Note she has a history of depression for which she was on Zoloft up into the third trimester.  She has PRN Procardia for contractions that were ordered prior to admission. ASSESSMENT / PLAN:   OB/GYN A. Status post third C-section on 04/06/538 Complicated by altered mental status left gaze preference and right side flaccid  P. Monitor surgical site Monitor H&H for possibility of hemorrhage OB/GYN to follow    PULMONARY A: No acute pulmonary issues Incidental finding on CT of the neck with abnormal lungs with dependent opacity and septal thickening which may reflect combination of pulmonary edema aspiration and/or atelectasis P:   Pulmonary toilet O2 as needed Serial chest x-ray  CARDIOVASCULAR A:  No  acute issues P:  Few monitoring  RENAL Lab Results  Component Value Date   CREATININE 0.41 (L) 03/28/2018   CREATININE 0.40 (L) 03/27/2018   CREATININE 0.47 (L) 08/11/2012    A:   No acute issues P:   Avoid nephrotoxins  GASTROINTESTINAL A:   Chronic constipation GI protection P:   P PI  HEMATOLOGIC Recent Labs    03/27/18 1220  03/28/18 0845  HGB 11.4* 10.5*    A:   Postsurgical C-section P:  Monitor hemoglobin hematocrit  INFECTIOUS A:   No overt infectious process P:   Trend white count temperature curve  ENDOCRINE CBG (last 3)  Recent Labs    03/28/18 0833  GLUCAP 118*    A:   No acute issues P:   Glucose  NEUROLOGIC A:   Lethargy Right-sided flaccid and left gaze preference resolved Labeled as code stroke initially Versed given as a preventative to seizures P:   RASS goal: 1 Post neurological episode in which her eyes rolled back and her head left gaze preference right-sided flaccid following C-section with spinal anesthesia.  She received Versed during this episode. Currently extremely lethargic but able to follow commands moves all extremities weakly. Neurology is following   FAMILY  - Updates: 03/28/2018 family updated at bedside  - Inter-disciplinary family meet or Palliative Care meeting due by:  day [place] [facility] Pager [phone] till 1 pm If no answer page [phone] 03/28/2018, 11:20 AM

## 2018-03-28 NOTE — H&P (Signed)
Kristine [name] is a 37 y.o. female presenting for repeat C/section, 37 wk delivery for cholestasis of pregnancy.  She was diagnosed with cholestasis since 24 wks. Saw MFM for consult and sono for SGA noted at anatomy sono. Bile acid, LFTs q 2 weeks remained stable. Serial growth sono were stable. ANtesting with NST/ BPP since 32 wks. She is on Actigall and Hydroxyzine prn.  Low FHR noted at 22 wks,  fetal echo normal, heart block ruled out.   Anxiety/ depression, was on Zoloft, stopped in 3rd rim.  Several visits for HAs, fatigue, contractions, sinusitis, decreased FMs. BTMZ on 7/30, 7/31 for contractions and Procardia 10 mg prn started.  Prior C/s x 2   OB History    Gravida  3   Para  2   Term  2   Preterm      AB      Living  2     SAB      TAB      Ectopic      Multiple  0   Live Births  2          Past Medical History:  Diagnosis Date  . Acute blood loss anemia 02/21/2013  . Anemia, iron deficiency 02/21/2013  . Cholestasis during pregnancy in second trimester 01/28/2018  . Constipation, chronic 12/12/2012  . Depression   . Previous cesarean delivery, antepartum condition or complication 11/22/2016  . Vitamin D deficiency, unspecified    Past Surgical History:  Procedure Laterality Date  . CESAREAN SECTION N/A 02/20/2013   Procedure: CESAREAN SECTION;  Surgeon: Robley FriesVaishali R Morio Widen, MD;  Location: [facility];  Service: Obstetrics;  Laterality: N/A;  . CESAREAN SECTION N/A 11/22/2016   Procedure: REPEAT CESAREAN SECTION;  Surgeon: Shea EvansVaishali Elaina Cara, MD;  [facility];  Service: Obstetrics;  Laterality: N/A;  EDD: 11/29/16   Family History: family history includes Cancer in her mother; Hypertension in her mother and paternal uncle; Hypothyroidism in her sister. Social History:  reports that she has never smoked. She has never used smokeless tobacco. She reports that she does not drink alcohol or use drugs.     Maternal Diabetes: No Genetic Screening:  Declined Maternal Ultrasounds/Referrals: Abnormal:  Findings:   Other: SGA at anatomy sono, saw MFM and serial sono since with adequate growth  Fetal Ultrasounds or other Referrals:  Fetal echo for bradycardia noted at 22 wks but echo normal and FHT normal since  Maternal Substance Abuse:  No Significant Maternal Medications:  Meds include: Zoloft Other:  Ursodiol (Actigall) and Hydroxyzine. BTMZ 7/30, 7/31. Procardia for contractions. Stopped Zoloft in 3rd trim Significant Maternal Lab Results:  Lab values include: Group B Strep positive Other Comments:  None  ROS History   Blood pressure (!) 98/56, pulse 76, temperature 97.6 F (36.4 C), temperature source Oral, resp. rate 16, height 5\' 3"  (1.6 m), weight 74.3 kg, last menstrual period 06/08/2017, SpO2 97 %, unknown if currently breastfeeding. Exam Physical Exam  Physical exam:  A&O x 3, no acute distress. Pleasant HEENT neg, no thyromegaly Lungs CTA bilat CV RRR, S1S2 normal Abdo soft, non tender, non acute Extr no edema/ tenderness Pelvic deferred FHT  120s Toco none   Prenatal labs: ABO, Rh: --/--/O POS (08/15 1220) Antibody: NEG (08/15 1220) Rubella: Immune (02/18 0000) RPR: Non Reactive (08/15 1220)  HBsAg: Negative (02/18 0000)  HIV: Non-reactive (02/18 0000)  GBS:   positive  Assessment/Plan: [redacted] wk gestation, with Cholestasis of pregnancy. Repeat C/section (prior 2). Declines  sterilization/  Risks/complications of surgery reviewed incl infection, bleeding, damage to internal organs including bladder, bowels, ureters, blood vessels, other risks from anesthesia, VTE and delayed complications of any surgery, complications in future surgery reviewed. Also discussed neonatal complications incl difficult delivery, laceration, vacuum assistance, TTN etc. Pt understands and agrees, all concerns addressed.     Robley FriesVaishali R Shamyra Farias 03/28/2018, 6:33 AM

## 2018-03-29 LAB — HIV ANTIBODY (ROUTINE TESTING W REFLEX): HIV Screen 4th Generation wRfx: NONREACTIVE

## 2018-03-29 NOTE — Progress Notes (Signed)
Pt ambulated to restroom, minimal dizziness upon standing, resolved within 10 seconds. Color remained appropriate and pt steady on her feet. Foley removed and encouraged to drink fluids and will continue to monitor. Sherald BargeMatthews, Kanan Sobek L

## 2018-03-29 NOTE — Lactation Note (Addendum)
This note was copied from a baby's chart. Lactation Consultation Note  Patient Name: Kristine [name] HYQMCassandria AngerV'HToday's Date: 03/29/2018 Reason for consult: Early term 37-38.6wks  Please refer to doc flow sheets. P3, ETI, Mom is experienced BF she BF her 2nd child for 18 months. Mom has close space pregnancies. LC enter room, mom had concerns that infant is  not latching to breast and she is not producing enough milk. LC discuss small tummy size first days of life and colostrum. LC discussed feeding guidelines and policy for ETI not to exceed 3 hours for a feeding  if infant is not cuing.  Infant latched for 8 minutes at breast , then was very sleepy. Mom will do STS. Mom hand expressed 7 ml of breast milk, LC assisted aunt in spoon feeding EBM to infant. Mom plans to pump q3 hours for 15-20 minutes. Mom shown how to use DEBP & how to disassemble, clean, & reassemble parts.  Mom made aware of O/P services, breastfeeding support groups, community resources, and our phone # for post-discharge questions.  Maternal Data    Feeding    LATCH Score                   Interventions    Lactation Tools Discussed/Used     Consult Status      Danelle EarthlyRobin Chay Mazzoni 03/29/2018, 7:51 AM

## 2018-03-29 NOTE — Progress Notes (Signed)
Subjective: Patient notes incisional pain, controlled with by mouth meds. Patient notes grade since Foley was removed this afternoon. Patient states slight dizziness initially when getting up out of bed. Patient successfully to shower. Patient is attempting to breast-feed. Patient reports no current headache, no vision change, no right upper quadrant pain. Patient notes still having slight weakness in her right hand which she notices mostly when she is trying to sign paperwork. Patient notes eating normally with no drool. No nausea or vomiting.  Objective: Vitals:   03/29/18 0534 03/29/18 0537 03/29/18 0800 03/29/18 1430  BP: (!) 84/59 (!) 88/56 (!) 87/63 (!) 94/55  Pulse: 75 78 80 71  Resp:   18   Temp:   98.3 F (36.8 C) 97.8 F (36.6 C)  TempSrc:   Oral Oral  SpO2: 96% 99% 97% 95%  Weight:      Height:       General: Sitting in bed in no distress, carrying for baby. Able to move in the bed without difficulty HEENT: No facial droop, normal facial movements Neurologic: Cranial nerves II through XII intact, grip strength 4-5 in right hand, flexion extension of upper arm normal strength. 1+ DTR, no clonus Abdomen: Fundus a properly tender below the umbilicus, no distention or tympany, no rebound or guarding, no right upper quadrant pain Incision: Dressed, scant staining on pad GU appropriate lochia Lower extremity: Trace edema  Assessment and plan: Post op day #1 status post repeat cesarean section with unclear neurologic event leading to right-sided weakness and facial droop which is now resolved. Patient with negative neurologic workup and plans outpatient neurologic follow-up. Exam improved. Stable vitals. No current evidence of preeclampsia, seizure or CVA. Working diagnosis is conversion disorder.  Postop: Routine postop care  Lendon ColonelKelly A Lagena Strand 03/29/2018 7:18 PM

## 2018-03-29 NOTE — Progress Notes (Signed)
Patient arrived from [facility]. MD notified of arrival. Patient admitted with a GCS OF 15. Will continue to monitor patient.

## 2018-03-30 LAB — CBC WITH DIFFERENTIAL/PLATELET
Basophils Absolute: 0 10*3/uL (ref 0.0–0.1)
Basophils Relative: 0 %
EOS ABS: 0.2 10*3/uL (ref 0.0–0.7)
EOS PCT: 2 %
HCT: 29.2 % — ABNORMAL LOW (ref 36.0–46.0)
HEMOGLOBIN: 9.5 g/dL — AB (ref 12.0–15.0)
Lymphocytes Relative: 28 %
Lymphs Abs: 2.5 10*3/uL (ref 0.7–4.0)
MCH: 31.5 pg (ref 26.0–34.0)
MCHC: 32.5 g/dL (ref 30.0–36.0)
MCV: 96.7 fL (ref 78.0–100.0)
MONOS PCT: 7 %
Monocytes Absolute: 0.7 10*3/uL (ref 0.1–1.0)
NEUTROS PCT: 63 %
Neutro Abs: 5.7 10*3/uL (ref 1.7–7.7)
Platelets: 211 10*3/uL (ref 150–400)
RBC: 3.02 MIL/uL — ABNORMAL LOW (ref 3.87–5.11)
RDW: 14.5 % (ref 11.5–15.5)
WBC: 9.1 10*3/uL (ref 4.0–10.5)

## 2018-03-30 MED ORDER — POLYSACCHARIDE IRON COMPLEX 150 MG PO CAPS: 150.0000 mg | ORAL_CAPSULE | Freq: Every day | ORAL | Status: DC

## 2018-03-30 MED ORDER — IBUPROFEN 600 MG PO TABS: 600.0000 mg | ORAL_TABLET | Freq: Four times a day (QID) | ORAL | 0 refills | Status: DC

## 2018-03-30 MED ORDER — OXYCODONE HCL 5 MG PO TABS: 5.0000 mg | ORAL_TABLET | ORAL | 0 refills | Status: DC | PRN

## 2018-03-30 MED ORDER — MAGNESIUM OXIDE 400 (241.3 MG) MG PO TABS: 400.0000 mg | ORAL_TABLET | Freq: Every day | ORAL | 1 refills | Status: DC

## 2018-03-30 MED ORDER — ACETAMINOPHEN 325 MG PO TABS: 650.0000 mg | ORAL_TABLET | ORAL | 0 refills | Status: DC | PRN

## 2018-03-30 MED ORDER — WITCH HAZEL-GLYCERIN EX PADS: 1.0000 "application " | MEDICATED_PAD | CUTANEOUS | 12 refills | Status: DC | PRN

## 2018-03-30 MED ORDER — POLYSACCHARIDE IRON COMPLEX 150 MG PO CAPS: 150.0000 mg | ORAL_CAPSULE | Freq: Every day | ORAL | 1 refills | Status: DC

## 2018-03-30 MED ORDER — MAGNESIUM OXIDE 400 (241.3 MG) MG PO TABS
400.0000 mg | ORAL_TABLET | Freq: Every day | ORAL | Status: DC
  Filled 2018-03-30 (×2): qty 1

## 2018-03-30 NOTE — Discharge Summary (Signed)
Obstetric Discharge Summary  Patient ID: Kristine [name] MRN: [id number] DOB/AGE: [birth year] 37 y.o.   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 Her postpartum course was complicated by an unknown intraoperative neurological event. After transfer to Neuro ICU at [facility], patient was evaluated by Neurology and found to have negative CT angiogram and EEG. Patient was deemed stable for transfer back to [facility] for routine postpartum postoperative care and advised to follow up with Neurology in four (4) to six (6) weeks due to possible Conversion disorder. After return to [facility], the patient had an uncomplicated postpartum course. By time of discharge on POD#2, her pain was controlled on oral pain medications; she had appropriate lochia and was ambulating, voiding without difficulty, tolerating regular diet and passing flatus. She requested discharge home and deemed stable.    Labs:  CBC Latest Ref Rng & Units 03/30/2018 03/28/2018 03/28/2018  WBC 4.0 - 10.5 K/uL 9.1 11.4(H) 11.1(H)  Hemoglobin 12.0 - 15.0 g/dL 1.3(Y9.5(L) 10.8(L) 10.5(L)  Hematocrit 36.0 - 46.0 % 29.2(L) 33.2(L) 30.0(L)  Platelets 150 - 400 K/uL 211 225 212   O POS  Physical exam:   Temp:  [97.8 F (36.6 C)-98.5 F (36.9 C)]  98.5 F (36.9 C) (08/17 2244) Pulse Rate:  [71-72] 72 (08/17 2244) Resp:  [20] 20 (08/17 2244) BP: (94-103)/(55-76) 103/76 (08/17 2244) SpO2:  [95 %] 95 % (08/17 1430) Weight:  [74.1 kg] 74.1 kg (08/18 0523)  General: alert and no distress, sitting in bed dressed and nursing infant  HEENT: symmetrical facial movements  Heart: regular rate  Lungs: clear to auscultation bilaterally  Breast: nipples free from cracks and bleeding  Lochia: appropriate  Abdomen: soft, NT  Uterine Fundus: firm  Incision: covered by dressing, no new drainage present  Extremities: No evidence of DVT seen on physical exam. No lower extremity edema.  Discharge Instructions: Per After Visit Summary.  Activity: Advance as tolerated. Pelvic rest for 6 weeks.  Also refer to After Visit Summary  Diet: Regular  Medications: Allergies as of 03/30/2018   No Known Allergies     Medication List    STOP taking these medications   lidocaine 5 % ointment Commonly known as:  XYLOCAINE   NIFEdipine 10 MG capsule Commonly known as:  PROCARDIA   ursodiol 300 MG capsule Commonly known as:  ACTIGALL     TAKE these medications   acetaminophen 325 MG tablet Commonly known as:  TYLENOL Take 2 tablets (650 mg total) by mouth every 4 (four) hours as needed (for pain scale < 4). What changed:    how much to take  when to take this  reasons to take this   hydrocortisone 2.5 % cream Apply 1 application topically daily as needed for hemorrhoids.   ibuprofen 600 MG tablet Commonly known as:  ADVIL,MOTRIN Take 1 tablet (600 mg total) by mouth every 6 (six) hours.  iron polysaccharides 150 MG capsule Commonly known as:  NIFEREX Take 1 capsule (150 mg total) by mouth daily.   magnesium oxide 400 (241.3 Mg) MG tablet Commonly known as:  MAG-OX Take 1 tablet (400 mg total) by mouth daily.   oxyCODONE 5 MG immediate release tablet Commonly known as:  Oxy IR/ROXICODONE Take 1 tablet (5 mg total) by  mouth every 4 (four) hours as needed (pain scale 4-7).   PRENATAL VITAMINS PO Take 1 tablet by mouth daily.   witch hazel-glycerin pad Commonly known as:  TUCKS Apply 1 application topically as needed for hemorrhoids.      Outpatient follow up:  Follow-up Information    Shea EvansMody, Vaishali, MD Follow up.   Specialty:  Obstetrics and Gynecology Why:  The office will call you schedule an appointment for staple removal. The office will contact you with the date of your Neurology follow up appointment (approximately four to six weeks from today).  Contact information: [place] [address] [place] [id number] [phone]          Discharged Condition: stable  Discharged to: home   Newborn Data:  Disposition:home with mother  Apgars: APGAR (1 MIN): 8   APGAR (5 MINS): 9    Baby Feeding: Breast   Gunnar BullaJenkins Michelle Texanna Hilburn, CNM [facility] 03/30/18 12:52 PM

## 2018-03-30 NOTE — Lactation Note (Signed)
This note was copied from a baby's chart. Lactation Consultation Note  Patient Name: Kristine [name] Reason for consult: Follow-up assessment;Early term 37-38.6wks;Infant < 6lbs;Infant weight loss P2, ETI, female 42 hrs, weight loss -5%. Per mom, she only used DEBP 3 times within past 24 hours and when pumping she is getting 8 mls. Per mom, she has been BF then supplementing w/ formula. Per mom, infant is latching well, mom states she BF infant at 9 pm for 20 mins, next feeding 11 pm  22 kcal formula (20 mls). Per mom, her plan is to breast and bottle feed her infant.  Mom, doesn't plan BF at night will give infant EBM or formula. LC unable observe latch at this time. LC reviewed ETI feeding policy, guidelines for EBM or formula based on hours after birth, current range is 24-48 hrs (10-20 ml per feeding).  LC assisted mom with hand expression due not wanting wake her husband mom had 18 mls of EBM which she will give to infant at next feeding in slow flow bottle nipple. Per mom , will try pump or hand express more in addition to latching infant to breast.  Reviewed w/ mom : LC  O/P services, breastfeeding support groups, community resources, and our phone # for post-discharge questions.  Maternal Data    Feeding Feeding Type: Bottle Fed - Formula Nipple Type: Slow - flow Length of feed: 15 min  LATCH Score                   Interventions    Lactation Tools Discussed/Used     Consult Status Consult Status: Follow-up Date: 03/30/18 Follow-up type: In-patient    Danelle EarthlyRobin Ronya [place] [name], 3:02 AM

## 2018-03-30 NOTE — Progress Notes (Addendum)
D/c instructions & PreE flyer discussed & given. Prescriptions discussed to include medication safety; [facility] booklet given.  Mom aware to use mom baby care booklet as reference if needed.  Mom to be d/c in stable condition with husband.

## 2018-03-30 NOTE — Discharge Instructions (Signed)
Preeclampsia and Eclampsia °Preeclampsia is a serious condition that develops only during pregnancy. It is also called toxemia of pregnancy. This condition causes high blood pressure along with other symptoms, such as swelling and headaches. These symptoms may develop as the condition gets worse. Preeclampsia may occur at 20 weeks of pregnancy or later. °Diagnosing and treating preeclampsia early is very important. If not treated early, it can cause serious problems for you and your baby. One problem it can lead to is eclampsia, which is a condition that causes muscle jerking or shaking (convulsions or seizures) in the mother. Delivering your baby is the best treatment for preeclampsia or eclampsia. Preeclampsia and eclampsia symptoms usually go away after your baby is born. °What are the causes? °The cause of preeclampsia is not known. °What increases the risk? °The following risk factors make you more likely to develop preeclampsia: °· Being pregnant for the first time. °· Having had preeclampsia during a past pregnancy. °· Having a family history of preeclampsia. °· Having high blood pressure. °· Being pregnant with twins or triplets. °· Being 35 or older. °· Being African-American. °· Having kidney disease or diabetes. °· Having medical conditions such as lupus or blood diseases. °· Being very overweight (obese). ° °What are the signs or symptoms? °The earliest signs of preeclampsia are: °· High blood pressure. °· Increased protein in your urine. Your health care provider will check for this at every visit before you give birth (prenatal visit). ° °Other symptoms that may develop as the condition gets worse include: °· Severe headaches. °· Sudden weight gain. °· Swelling of the hands, face, legs, and feet. °· Nausea and vomiting. °· Vision problems, such as blurred or double vision. °· Numbness in the face, arms, legs, and feet. °· Urinating less than usual. °· Dizziness. °· Slurred speech. °· Abdominal pain,  especially upper abdominal pain. °· Convulsions or seizures. ° °Symptoms generally go away after giving birth. °How is this diagnosed? °There are no screening tests for preeclampsia. Your health care provider will ask you about symptoms and check for signs of preeclampsia during your prenatal visits. You may also have tests that include: °· Urine tests. °· Blood tests. °· Checking your blood pressure. °· Monitoring your baby’s heart rate. °· Ultrasound. ° °How is this treated? °You and your health care provider will determine the treatment approach that is best for you. Treatment may include: °· Having more frequent prenatal exams to check for signs of preeclampsia, if you have an increased risk for preeclampsia. °· Bed rest. °· Reducing how much salt (sodium) you eat. °· Medicine to lower your blood pressure. °· Staying in the hospital, if your condition is severe. There, treatment will focus on controlling your blood pressure and the amount of fluids in your body (fluid retention). °· You may need to take medicine (magnesium sulfate) to prevent seizures. This medicine may be given as an injection or through an IV tube. °· Delivering your baby early, if your condition gets worse. You may have your labor started with medicine (induced), or you may have a cesarean delivery. ° °Follow these instructions at home: °Eating and drinking ° °· Drink enough fluid to keep your urine clear or pale yellow. °· Eat a healthy diet that is low in sodium. Do not add salt to your food. Check nutrition labels to see how much sodium a food or beverage contains. °· Avoid caffeine. °Lifestyle °· Do not use any products that contain nicotine or tobacco, such as cigarettes   and e-cigarettes. If you need help quitting, ask your health care provider.  Do not use alcohol or drugs.  Avoid stress as much as possible. Rest and get plenty of sleep. General instructions  Take over-the-counter and prescription medicines only as told by your  health care provider.  When lying down, lie on your side. This keeps pressure off of your baby.  When sitting or lying down, raise (elevate) your feet. Try putting some pillows underneath your lower legs.  Exercise regularly. Ask your health care provider what kinds of exercise are best for you.  Keep all follow-up and prenatal visits as told by your health care provider. This is important. How is this prevented? To prevent preeclampsia or eclampsia from developing during another pregnancy:  Get proper medical care during pregnancy. Your health care provider may be able to prevent preeclampsia or diagnose and treat it early.  Your health care provider may have you take a low-dose aspirin or a calcium supplement during your next pregnancy.  You may have tests of your blood pressure and kidney function after giving birth.  Maintain a healthy weight. Ask your health care provider for help managing weight gain during pregnancy.  Work with your health care provider to manage any long-term (chronic) health conditions you have, such as diabetes or kidney problems.  Contact a health care provider if:  You gain more weight than expected.  You have headaches.  You have nausea or vomiting.  You have abdominal pain.  You feel dizzy or light-headed. Get help right away if:  You develop sudden or severe swelling anywhere in your body. This usually happens in the legs.  You gain 5 lbs (2.3 kg) or more during one week.  You have severe: ? Abdominal pain. ? Headaches. ? Dizziness. ? Vision problems. ? Confusion. ? Nausea or vomiting.  You have a seizure.  You have trouble moving any part of your body.  You develop numbness in any part of your body.  You have trouble speaking.  You have any abnormal bleeding.  You pass out. This information is not intended to replace advice given to you by your health care provider. Make sure you discuss any questions you have with your health  care provider. Document Released: 07/27/2000 Document Revised: 03/27/2016 Document Reviewed: 03/05/2016 [employer] Interactive Patient Education  2018 [employer] Breastfeeding Choosing to breastfeed is one of the best decisions you can make for yourself and your baby. A change in hormones during pregnancy causes your breasts to make breast milk in your milk-producing glands. Hormones prevent breast milk from being released before your baby is born. They also prompt milk flow after birth. Once breastfeeding has begun, thoughts of your baby, as well as his or her sucking or crying, can stimulate the release of milk from your milk-producing glands. Benefits of breastfeeding Research shows that breastfeeding offers many health benefits for infants and mothers. It also offers a cost-free and convenient way to feed your baby. For your baby  Your first milk (colostrum) helps your baby's digestive system to function better.  Special cells in your milk (antibodies) help your baby to fight off infections.  Breastfed babies are less likely to develop asthma, allergies, obesity, or type 2 diabetes. They are also at lower risk for sudden infant death syndrome (SIDS).  Nutrients in breast milk are better able to meet your babys needs compared to infant formula.  Breast milk improves your baby's brain development. For you  Breastfeeding helps to create a  very special bond between you and your baby.  Breastfeeding is convenient. Breast milk costs nothing and is always available at the correct temperature.  Breastfeeding helps to burn calories. It helps you to lose the weight that you gained during pregnancy.  Breastfeeding makes your uterus return faster to its size before pregnancy. It also slows bleeding (lochia) after you give birth.  Breastfeeding helps to lower your risk of developing type 2 diabetes, osteoporosis, rheumatoid arthritis, cardiovascular disease, and breast, ovarian, uterine, and  endometrial cancer later in life. Breastfeeding basics Starting breastfeeding  Find a comfortable place to sit or lie down, with your neck and back well-supported.  Place a pillow or a rolled-up blanket under your baby to bring him or her to the level of your breast (if you are seated). Nursing pillows are specially designed to help support your arms and your baby while you breastfeed.  Make sure that your baby's tummy (abdomen) is facing your abdomen.  Gently massage your breast. With your fingertips, massage from the outer edges of your breast inward toward the nipple. This encourages milk flow. If your milk flows slowly, you may need to continue this action during the feeding.  Support your breast with 4 fingers underneath and your thumb above your nipple (make the letter "C" with your hand). Make sure your fingers are well away from your nipple and your babys mouth.  Stroke your baby's lips gently with your finger or nipple.  When your baby's mouth is open wide enough, quickly bring your baby to your breast, placing your entire nipple and as much of the areola as possible into your baby's mouth. The areola is the colored area around your nipple. ? More areola should be visible above your baby's upper lip than below the lower lip. ? Your baby's lips should be opened and extended outward (flanged) to ensure an adequate, comfortable latch. ? Your baby's tongue should be between his or her lower gum and your breast.  Make sure that your baby's mouth is correctly positioned around your nipple (latched). Your baby's lips should create a seal on your breast and be turned out (everted).  It is common for your baby to suck about 2-3 minutes in order to start the flow of breast milk. Latching Teaching your baby how to latch onto your breast properly is very important. An improper latch can cause nipple pain, decreased milk supply, and poor weight gain in your baby. Also, if your baby is not  latched onto your nipple properly, he or she may swallow some air during feeding. This can make your baby fussy. Burping your baby when you switch breasts during the feeding can help to get rid of the air. However, teaching your baby to latch on properly is still the best way to prevent fussiness from swallowing air while breastfeeding. Signs that your baby has successfully latched onto your nipple  Silent tugging or silent sucking, without causing you pain. Infant's lips should be extended outward (flanged).  Swallowing heard between every 3-4 sucks once your milk has started to flow (after your let-down milk reflex occurs).  Muscle movement above and in front of his or her ears while sucking.  Signs that your baby has not successfully latched onto your nipple  Sucking sounds or smacking sounds from your baby while breastfeeding.  Nipple pain.  If you think your baby has not latched on correctly, slip your finger into the corner of your babys mouth to break the suction and place  it between your baby's gums. Attempt to start breastfeeding again. Signs of successful breastfeeding Signs from your baby  Your baby will gradually decrease the number of sucks or will completely stop sucking.  Your baby will fall asleep.  Your baby's body will relax.  Your baby will retain a small amount of milk in his or her mouth.  Your baby will let go of your breast by himself or herself.  Signs from you  Breasts that have increased in firmness, weight, and size 1-3 hours after feeding.  Breasts that are softer immediately after breastfeeding.  Increased milk volume, as well as a change in milk consistency and color by the fifth day of breastfeeding.  Nipples that are not sore, cracked, or bleeding.  Signs that your baby is getting enough milk  Wetting at least 1-2 diapers during the first 24 hours after birth.  Wetting at least 5-6 diapers every 24 hours for the first week after birth. The  urine should be clear or pale yellow by the age of 5 days.  Wetting 6-8 diapers every 24 hours as your baby continues to grow and develop.  At least 3 stools in a 24-hour period by the age of 5 days. The stool should be soft and yellow.  At least 3 stools in a 24-hour period by the age of 7 days. The stool should be seedy and yellow.  No loss of weight greater than 10% of birth weight during the first 3 days of life.  Average weight gain of 4-7 oz (113-198 g) per week after the age of 4 days.  Consistent daily weight gain by the age of 5 days, without weight loss after the age of 2 weeks. After a feeding, your baby may spit up a small amount of milk. This is normal. Breastfeeding frequency and duration Frequent feeding will help you make more milk and can prevent sore nipples and extremely full breasts (breast engorgement). Breastfeed when you feel the need to reduce the fullness of your breasts or when your baby shows signs of hunger. This is called "breastfeeding on demand." Signs that your baby is hungry include:  Increased alertness, activity, or restlessness.  Movement of the head from side to side.  Opening of the mouth when the corner of the mouth or cheek is stroked (rooting).  Increased sucking sounds, smacking lips, cooing, sighing, or squeaking.  Hand-to-mouth movements and sucking on fingers or hands.  Fussing or crying.  Avoid introducing a pacifier to your baby in the first 4-6 weeks after your baby is born. After this time, you may choose to use a pacifier. Research has shown that pacifier use during the first year of a baby's life decreases the risk of sudden infant death syndrome (SIDS). Allow your baby to feed on each breast as long as he or she wants. When your baby unlatches or falls asleep while feeding from the first breast, offer the second breast. Because newborns are often sleepy in the first few weeks of life, you may need to awaken your baby to get him or her  to feed. Breastfeeding times will vary from baby to baby. However, the following rules can serve as a guide to help you make sure that your baby is properly fed:  Newborns (babies 37 weeks of age or younger) may breastfeed every 1-3 hours.  Newborns should not go without breastfeeding for longer than 3 hours during the day or 5 hours during the night.  You should breastfeed your baby  a minimum of 8 times in a 24-hour period.  Breast milk pumping Pumping and storing breast milk allows you to make sure that your baby is exclusively fed your breast milk, even at times when you are unable to breastfeed. This is especially important if you go back to work while you are still breastfeeding, or if you are not able to be present during feedings. Your lactation consultant can help you find a method of pumping that works best for you and give you guidelines about how long it is safe to store breast milk. Caring for your breasts while you breastfeed Nipples can become dry, cracked, and sore while breastfeeding. The following recommendations can help keep your breasts moisturized and healthy:  Avoid using soap on your nipples.  Wear a supportive bra designed especially for nursing. Avoid wearing underwire-style bras or extremely tight bras (sports bras).  Air-dry your nipples for 3-4 minutes after each feeding.  Use only cotton bra pads to absorb leaked breast milk. Leaking of breast milk between feedings is normal.  Use lanolin on your nipples after breastfeeding. Lanolin helps to maintain your skin's normal moisture barrier. Pure lanolin is not harmful (not toxic) to your baby. You may also hand express a few drops of breast milk and gently massage that milk into your nipples and allow the milk to air-dry.  In the first few weeks after giving birth, some women experience breast engorgement. Engorgement can make your breasts feel heavy, warm, and tender to the touch. Engorgement peaks within 3-5 days  after you give birth. The following recommendations can help to ease engorgement:  Completely empty your breasts while breastfeeding or pumping. You may want to start by applying warm, moist heat (in the shower or with warm, water-soaked hand towels) just before feeding or pumping. This increases circulation and helps the milk flow. If your baby does not completely empty your breasts while breastfeeding, pump any extra milk after he or she is finished.  Apply ice packs to your breasts immediately after breastfeeding or pumping, unless this is too uncomfortable for you. To do this: ? Put ice in a plastic bag. ? Place a towel between your skin and the bag. ? Leave the ice on for 20 minutes, 2-3 times a day.  Make sure that your baby is latched on and positioned properly while breastfeeding.  If engorgement persists after 48 hours of following these recommendations, contact your health care provider or a Advertising copywriter. Overall health care recommendations while breastfeeding  Eat 3 healthy meals and 3 snacks every day. Well-nourished mothers who are breastfeeding need an additional 450-500 calories a day. You can meet this requirement by increasing the amount of a balanced diet that you eat.  Drink enough water to keep your urine pale yellow or clear.  Rest often, relax, and continue to take your prenatal vitamins to prevent fatigue, stress, and low vitamin and mineral levels in your body (nutrient deficiencies).  Do not use any products that contain nicotine or tobacco, such as cigarettes and e-cigarettes. Your baby may be harmed by chemicals from cigarettes that pass into breast milk and exposure to secondhand smoke. If you need help quitting, ask your health care provider.  Avoid alcohol.  Do not use illegal drugs or marijuana.  Talk with your health care provider before taking any medicines. These include over-the-counter and prescription medicines as well as vitamins and herbal  supplements. Some medicines that may be harmful to your baby can pass through breast milk.  It is possible to become pregnant while breastfeeding. If birth control is desired, ask your health care provider about options that will be safe while breastfeeding your baby. Where to find more information: [employer]: www.llli.org Contact a health care provider if:  You feel like you want to stop breastfeeding or have become frustrated with breastfeeding.  Your nipples are cracked or bleeding.  Your breasts are red, tender, or warm.  You have: ? Painful breasts or nipples. ? A swollen area on either breast. ? A fever or chills. ? Nausea or vomiting. ? Drainage other than breast milk from your nipples.  Your breasts do not become full before feedings by the fifth day after you give birth.  You feel sad and depressed.  Your baby is: ? Too sleepy to eat well. ? Having trouble sleeping. ? More than 58 week old and wetting fewer than 6 diapers in a 24-hour period. ? Not gaining weight by 6 days of age.  Your baby has fewer than 3 stools in a 24-hour period.  Your baby's skin or the white parts of his or her eyes become yellow. Get help right away if:  Your baby is overly tired (lethargic) and does not want to wake up and feed.  Your baby develops an unexplained fever. Summary  Breastfeeding offers many health benefits for infant and mothers.  Try to breastfeed your infant when he or she shows early signs of hunger.  Gently tickle or stroke your baby's lips with your finger or nipple to allow the baby to open his or her mouth. Bring the baby to your breast. Make sure that much of the areola is in your baby's mouth. Offer one side and burp the baby before you offer the other side.  Talk with your health care provider or lactation consultant if you have questions or you face problems as you breastfeed. This information is not intended to replace advice given to you  by your health care provider. Make sure you discuss any questions you have with your health care provider. Document Released: 07/30/2005 Document Revised: 08/31/2016 Document Reviewed: 08/31/2016 Elsevier Interactive Patient Education  2018 [employer]. Breastfeeding Challenges and Solutions Even though breastfeeding is natural, it can be challenging, especially in the first few weeks after childbirth. It is normal for problems to arise when starting to breastfeed your new baby, even if you have breastfed before. This document provides some solutions to the most common breastfeeding challenges. Challenges and solutions Challenge--Cracked or Sore Nipples Cracked or sore nipples are commonly experienced by breastfeeding mothers. Cracked or sore nipples often are caused by inadequate latching (when your baby's mouth attaches to your breast to breastfeed). Soreness can also happen if your baby is not positioned properly at your breast. Although nipple cracking and soreness are common during the first week after birth, nipple pain is never normal. If you experience nipple cracking or soreness that lasts longer than 1 week or nipple pain, call your health care provider or lactation consultant. Solution Ensure proper latching and positioning of your baby by following the steps below:  Find a comfortable place to sit or lie down, with your neck and back well supported.  Place a pillow or rolled up blanket under your baby to bring him or her to the level of your breast (if you are seated).  Make sure that your baby's abdomen is facing your abdomen.  Gently massage your breast. With your fingertips, massage from your chest wall toward your  nipple in a circular motion. This encourages milk flow. You may need to continue this action during the feeding if your milk flows slowly.  Support your breast with 4 fingers underneath and your thumb above your nipple. Make sure your fingers are well away from your  nipple and your babys mouth.  Stroke your baby's lips gently with your finger or nipple.  When your baby's mouth is open wide enough, quickly bring your baby to your breast, placing your entire nipple and as much of the colored area around your nipple (areola) as possible into your baby's mouth. ? More areola should be visible above your baby's upper lip than below the lower lip. ? Your baby's tongue should be between his or her lower gum and your breast.  Ensure that your baby's mouth is correctly positioned around your nipple (latched). Your baby's lips should create a seal on your breast and be turned out (everted).  It is common for your baby to suck for about 2-3 minutes in order to start the flow of breast milk.  Signs that your baby has successfully latched on to your nipple include:  Quietly tugging or quietly sucking without causing you pain.  Swallowing heard between every 3-4 sucks.  Muscle movement above and in front of his or her ears with sucking.  Signs that your baby has not successfully latched on to nipple include:  Sucking sounds or smacking sounds from your baby while nursing.  Nipple pain.  Ensure that your breasts stay moisturized and healthy by:  Avoiding the use of soap on your nipples.  Wearing a supportive bra. Avoid wearing underwire-style bras or tight bras.  Air drying your nipples for 3-4 minutes after each feeding.  Using only cotton bra pads to absorb breast milk leakage. Leaking of breast milk between feedings is normal. Be sure to change the pads if they become soaked with milk.  Using lanolin on your nipples after nursing. Lanolin helps to maintain your skin's normal moisture barrier. If you use pure lanolin you do not need to wash it off before feeding your baby again. Pure lanolin is not toxic to your baby. You may also hand express a few drops of breast milk and gently massage that milk into your nipples, allowing it to air  dry.  Challenge--Breast Engorgement Breast engorgement is the overfilling of your breasts with breast milk. In the first few weeks after giving birth, you may experience breast engorgement. Breast engorgement can make your breasts throb and feel hard, tightly stretched, warm, and tender. Engorgement peaks about the fifth day after you give birth. Having breast engorgement does not mean you have to stop breastfeeding your baby. Solution  Breastfeed when you feel the need to reduce the fullness of your breasts or when your baby shows signs of hunger. This is called "breastfeeding on demand."  Newborns (babies younger than 4 weeks) often breastfeed every 1-3 hours during the day. You may need to awaken your baby to feed if he or she is asleep at a feeding time.  Do not allow your baby to sleep longer than 5 hours during the night without a feeding.  Pump or hand express breast milk before breastfeeding to soften your breast, areola, and nipple.  Apply warm, moist heat (in the shower or with warm water-soaked hand towels) just before feeding or pumping, or massage your breast before or during breastfeeding. This increases circulation and helps your milk to flow.  Completely empty your breasts when breastfeeding or  pumping. Afterward, wear a snug bra (nursing or regular) or tank top for 1-2 days to signal your body to slightly decrease milk production. Only wear snug bras or tank tops to treat engorgement. Tight bras typically should be avoided by breastfeeding mothers. Once engorgement is relieved, return to wearing regular, loose-fitting clothes.  Apply ice packs to your breasts to lessen the pain from engorgement and relieve swelling, unless the ice is uncomfortable for you.  Do not delay feedings. Try to relax when it is time to feed your baby. This helps to trigger your "let-down reflex," which releases milk from your breast.  Ensure your baby is latched on to your breast and positioned  properly while breastfeeding.  Allow your baby to remain at your breast as long as he or she is latched on well and actively sucking. Your baby will let you know when he or she is done breastfeeding by pulling away from your breast or falling asleep.  Avoid introducing bottles or pacifiers to your baby in the early weeks of breastfeeding. Wait to introduce these things until after resolving any breastfeeding challenges.  Try to pump your milk on the same schedule as when your baby would breastfeed if you are returning to work or away from home for an extended period.  Drink plenty of fluids to avoid dehydration, which can eventually put you at greater risk of breast engorgement.  If you follow these suggestions, your engorgement should improve in 24-48 hours. If you are still experiencing difficulty, call your lactation consultant or health care provider. Challenge--Plugged Milk Ducts Plugged milk ducts occur when the duct does not drain milk effectively and becomes swollen. Wearing a tight-fitting nursing bra or having difficulty with latching may cause plugged milk ducts. Not drinking enough water (8-10 c [1.9-2.4 L] per day) can contribute to plugged milk ducts. Once a duct has become plugged, hard lumps, soreness, and redness may develop in your breast. Solution Do not delay feedings. Feed your baby frequently and try to empty your breasts of milk at each feeding. Try breastfeeding from the affected side first so there is a better chance that the milk will drain completely from that breast. Apply warm, moist towels to your breasts for 5-10 minutes before feeding. Alternatively, a hot shower right before breastfeeding can provide the moist heat that can encourage milk flow. Gentle massage of the sore area before and during a feeding may also help. Avoid wearing tight clothing or bras that put pressure on your breasts. Wear bras that offer good support to your breasts, but avoid underwire bras. If  you have a plugged milk duct and develop a fever, you need to see your health care provider. Challenge--Mastitis Mastitis is inflammation of your breast. It usually is caused by a bacterial infection and can cause flu-like symptoms. You may develop redness in your breast and a fever. Often when mastitis occurs, your breast becomes firm, warm, and very painful. The most common causes of mastitis are poor latching, ineffective sucking from your baby, consistent pressure on your breast (possibly from wearing a tight-fitting bra or shirt that restricts the milk flow), unusual stress or fatigue, or missed feedings. Solution You will be given antibiotic medicine to treat the infection. It is still important to breastfeed frequently to empty your breasts. Continuing to breastfeed while you recover from mastitis will not harm your baby. Make sure your baby is positioned properly during every feeding. Apply moist heat to your breasts for a few minutes before feeding  to help the milk flow and to help your breasts empty more easily. Challenge--Thrush Ginette Pitman is a yeast infection that can form on your nipples, in your breast, or in your baby's mouth. It causes itching, soreness, burning or stabbing pain, and sometimes a rash. Solution You will be given a medicated ointment for your nipples, and your baby will be given a liquid medicine for his or her mouth. It is important that you and your baby are treated at the same time because thrush can be passed between you and your baby. Change disposable nursing pads often. Any bras, towels, or clothing that come in contact with infected areas of your body or your baby's body need to be washed in very hot water every day. Wash your hands and your baby's hands often. All pacifiers, bottle nipples, or toys your baby puts in his or her mouth should be boiled once a day for 20 minutes. After 1 week of treatment, discard pacifiers and bottle nipples and buy new ones. All breast pump  parts that touch the milk need to be boiled for 20 minutes every day. Challenge--Low Milk Supply You may not be producing enough milk if your baby is not gaining the proper amount of weight. Breast milk production is based on a supply-and-demand system. Your milk supply depends on how frequently and effectively your baby empties your breast. Solution The more you breastfeed and pump, the more breast milk you will produce. It is important that your baby empties at least one of your breasts at each feeding. If this is not happening, then use a breast pump or hand express any milk that remains. This will help to drain as much milk as possible at each feeding. It will also signal your body to produce more milk. If your baby is not emptying your breasts, it may be due to latching, sucking, or positioning problems. If low milk supply continues after addressing these issues, contact your health care provider or a lactation specialist as soon as possible. Challenge--Inverted or Flat Nipples Some women have nipples that turn inward instead of protruding outward. Other women have nipples that are flat. Inverted or flat nipples can sometimes make it more difficult for your baby to latch onto your breast. Solution You may be given a small device that pulls out inverted nipples. This device should be applied right before your baby is brought to your breast. You can also try using a breast pump for a short time before placing the baby at your breast. The pump can pull your nipple outwards to help your infant latch more easily. The baby's sucking motion will help the inverted nipple protrude as well. If you have flat nipples, encourage your baby to latch onto your breast and feed frequently in the early days after birth. This will give your baby practice latching on correctly while your breast is still soft. When your milk supply increases, between the second and fifth day after birth and your breasts become full, your  baby will have an easier time latching. Contact a lactation consultant if you still have concerns. She or he can teach you additional techniques to address breastfeeding problems related to nipple shape and position. Where to find more information: [employer]: www.llli.org This information is not intended to replace advice given to you by your health care provider. Make sure you discuss any questions you have with your health care provider. Document Released: 01/21/2006 Document Revised: 01/11/2016 Document Reviewed: 01/23/2013 Elsevier Interactive Patient Education  2017 [employer] Home Care Instructions for Mom ACTIVITY  Gradually return to your regular activities.  Let yourself rest. Nap while your baby sleeps.  Avoid lifting anything that is heavier than 10 lb (4.5 kg) until your health care provider says it is okay.  Avoid activities that take a lot of effort and energy (are strenuous) until approved by your health care provider. Walking at a slow-to-moderate pace is usually safe.  If you had a cesarean delivery: ? Do not vacuum, climb stairs, or drive a car for 4-6 weeks. ? Have someone help you at home until you feel like you can do your usual activities yourself. ? Do exercises as told by your health care provider, if this applies.  VAGINAL BLEEDING You may continue to bleed for 4-6 weeks after delivery. Over time, the amount of blood usually decreases and the color of the blood usually gets lighter. However, the flow of bright red blood may increase if you have been too active. If you need to use more than one pad in an hour because your pad gets soaked, or if you pass a large clot:  Lie down.  Raise your feet.  Place a cold compress on your lower abdomen.  Rest.  Call your health care provider.  If you are breastfeeding, your period should return anytime between 8 weeks after delivery and the time that you stop breastfeeding. If you are not  breastfeeding, your period should return 6-8 weeks after delivery. PERINEAL CARE The perineal area, or perineum, is the part of your body between your thighs. After delivery, this area needs special care. Follow these instructions as told by your health care provider.  Take warm tub baths for 15-20 minutes.  Use medicated pads and pain-relieving sprays and creams as told.  Do not use tampons or douches until vaginal bleeding has stopped.  Each time you go to the bathroom: ? Use a peri bottle. ? Change your pad. ? Use towelettes in place of toilet paper until your stitches have healed.  Do Kegel exercises every day. Kegel exercises help to maintain the muscles that support the vagina, bladder, and bowels. You can do these exercises while you are standing, sitting, or lying down. To do Kegel exercises: ? Tighten the muscles of your abdomen and the muscles that surround your birth canal. ? Hold for a few seconds. ? Relax. ? Repeat until you have done this 5 times in a row.  To prevent hemorrhoids from developing or getting worse: ? Drink enough fluid to keep your urine clear or pale yellow. ? Avoid straining when having a bowel movement. ? Take over-the-counter medicines and stool softeners as told by your health care provider.  BREAST CARE  Wear a tight-fitting bra.  Avoid taking over-the-counter pain medicine for breast discomfort.  Apply ice to the breasts to help with discomfort as needed: ? Put ice in a plastic bag. ? Place a towel between your skin and the bag. ? Leave the ice on for 20 minutes or as told by your health care provider.  NUTRITION  Eat a well-balanced diet.  Do not try to lose weight quickly by cutting back on calories.  Take your prenatal vitamins until your postpartum checkup or until your health care provider tells you to stop.  POSTPARTUM DEPRESSION You may find yourself crying for no apparent reason and unable to cope with all of the changes that  come with having a newborn. This mood is called postpartum depression. Postpartum depression  happens because your hormone levels change after delivery. If you have postpartum depression, get support from your partner, friends, and family. If the depression does not go away on its own after several weeks, contact your health care provider. BREAST SELF-EXAM Do a breast self-exam each month, at the same time of the month. If you are breastfeeding, check your breasts just after a feeding, when your breasts are less full. If you are breastfeeding and your period has started, check your breasts on day 5, 6, or 7 of your period. Report any lumps, bumps, or discharge to your health care provider. Know that breasts are normally lumpy if you are breastfeeding. This is temporary, and it is not a health risk. INTIMACY AND SEXUALITY Avoid sexual activity for at least 3-4 weeks after delivery or until the brownish-red vaginal flow is completely gone. If you want to avoid pregnancy, use some form of birth control. You can get pregnant after delivery, even if you have not had your period. SEEK MEDICAL CARE IF:  You feel unable to cope with the changes that a child brings to your life, and these feelings do not go away after several weeks.  You notice a lump, a bump, or discharge on your breast.  SEEK IMMEDIATE MEDICAL CARE IF:  Blood soaks your pad in 1 hour or less.  You have: ? Severe pain or cramping in your lower abdomen. ? A bad-smelling vaginal discharge. ? A fever that is not controlled by medicine. ? A fever, and an area of your breast is red and sore. ? Pain or redness in your calf. ? Sudden, severe chest pain. ? Shortness of breath. ? Painful or bloody urination. ? Problems with your vision.  You vomit for 12 hours or longer.  You develop a severe headache.  You have serious thoughts about hurting yourself, your child, or anyone else.  This information is not intended to replace advice  given to you by your health care provider. Make sure you discuss any questions you have with your health care provider. Document Released: 07/27/2000 Document Revised: 01/05/2016 Document Reviewed: 01/31/2015 Elsevier Interactive Patient Education  2017 [employer] Postpartum Care After Cesarean Delivery The period of time right after you deliver your newborn is called the postpartum period. What kind of medical care will I receive?  You may continue to receive fluids and medicines through an IV tube inserted into one of your veins.  You may have small, flexible tube (catheter) draining urine from your bladder into a bag outside of your body. The catheter will be removed as soon as possible.  You may be given a squirt bottle to use when you go to the bathroom. You may use this until you are comfortable wiping as usual. To use the squirt bottle, follow these steps: ? Before you urinate, fill the squirt bottle with warm water. The water should be warm. Do not use hot water. ? After you urinate, while you are sitting on the toilet, use the squirt bottle to rinse the area around your urethra and vaginal opening. This rinses away any urine and blood. ? You may do this instead of wiping. As you start healing, you may use the squirt bottle before wiping yourself. Make sure to wipe gently. ? Fill the squirt bottle with clean water every time you use the bathroom.  You will be given sanitary pads to wear.  Your incision will be monitored to make sure it is healing properly. You will be told when  it is safe for your stitches, staples, or skin adhesive tape to be removed. What can I expect?  You may not feel the need to urinate for several hours after delivery.  You will have some soreness and pain in your abdomen. You may have a small amount of blood or clear fluid coming from your incision.  If you are breastfeeding, you may have uterine contractions every time you breastfeed for up to several  weeks postpartum. Uterine contractions help your uterus return to its normal size.  It is normal to have vaginal bleeding (lochia) after delivery. The amount and appearance of lochia is often similar to a menstrual period in the first week after delivery. It will gradually decrease over the next few weeks to a dry, yellow-brown discharge. For most women, lochia stops completely by 6-8 weeks after delivery. Vaginal bleeding can vary from woman to woman.  Within the first few days after delivery, you may have breast engorgement. This is when your breasts feel heavy, full, and uncomfortable. Your breasts may also throb and feel hard, tightly stretched, warm, and tender. After this occurs, you may have milk leaking from your breasts.Your health care provider can help you relieve discomfort due to breast engorgement. Breast engorgement should go away within a few days.  You may feel more sad or worried than normal due to hormonal changes after delivery. These feelings should not last more than a few days. If these feelings do not go away after several days, speak with your health care provider. How should I care for myself?  Tell your health care provider if you have pain or discomfort.  Drink enough water to keep your urine clear or pale yellow.  Wash your hands thoroughly with soap and water for at least 20 seconds after changing your sanitary pads or using the toilet, and before holding or feeding your baby.  If you are not breastfeeding, avoid touching your breasts a lot. Doing this can make your breasts produce more milk.  If you become weak or lightheaded, or you feel like you might faint, ask for help before: ? Getting out of bed. ? Showering.  Change your sanitary pads frequently. Watch for any changes in your flow, such as a sudden increase in volume, a change in color, or the passing of large blood clots. If you pass a blood clot from your vagina, save it to show to your health care  provider. Do not flush blood clots down the toilet without having your health care provider look at them.  Make sure that all your vaccinations are up to date. This can help protect you and your baby from getting certain diseases. You may need to have immunizations done before you leave the hospital.  If desired, talk with your health care provider about methods of family planning or birth control (contraception). How can I start bonding with my baby? Spending as much time as possible with your baby is very important. During this time, you and your baby can get to know each other and develop a bond. Having your baby stay with you in your room (rooming in) can give you time to get to know your baby. Rooming in can also help you become comfortable caring for your baby. Breastfeeding can also help you bond with your baby. How can I plan for returning home with my baby?  Make sure that you have a car seat installed in your vehicle. ? Your car seat should be checked by a certified  car seat installer to make sure that it is installed safely. ? Make sure that your baby fits into the car seat safely.  Ask your health care provider any questions you have about caring for yourself or your baby. Make sure that you are able to contact your health care provider with any questions after leaving the hospital. This information is not intended to replace advice given to you by your health care provider. Make sure you discuss any questions you have with your health care provider. Document Released: 04/23/2012 Document Revised: 01/02/2016 Document Reviewed: 07/04/2015 Elsevier Interactive Patient Education  2018 [employer]. Postpartum Depression and Baby Blues The postpartum period begins right after the birth of a baby. During this time, there is often a great amount of joy and excitement. It is also a time of many changes in the life of the parents. Regardless of how many times a mother gives birth, each child  brings new challenges and dynamics to the family. It is not unusual to have feelings of excitement along with confusing shifts in moods, emotions, and thoughts. All mothers are at risk of developing postpartum depression or the "baby blues." These mood changes can occur right after giving birth, or they may occur many months after giving birth. The baby blues or postpartum depression can be mild or severe. Additionally, postpartum depression can go away rather quickly, or it can be a long-term condition. What are the causes? Raised hormone levels and the rapid drop in those levels are thought to be a main cause of postpartum depression and the baby blues. A number of hormones change during and after pregnancy. Estrogen and progesterone usually decrease right after the delivery of your baby. The levels of thyroid hormone and various cortisol steroids also rapidly drop. Other factors that play a role in these mood changes include major life events and genetics. What increases the risk? If you have any of the following risks for the baby blues or postpartum depression, know what symptoms to watch out for during the postpartum period. Risk factors that may increase the likelihood of getting the baby blues or postpartum depression include:  Having a personal or family history of depression.  Having depression while being pregnant.  Having premenstrual mood issues or mood issues related to oral contraceptives.  Having a lot of life stress.  Having marital conflict.  Lacking a social support network.  Having a baby with special needs.  Having health problems, such as diabetes.  What are the signs or symptoms? Symptoms of baby blues include:  Brief changes in mood, such as going from extreme happiness to sadness.  Decreased concentration.  Difficulty sleeping.  Crying spells, tearfulness.  Irritability.  Anxiety.  Symptoms of postpartum depression typically begin within the first month  after giving birth. These symptoms include:  Difficulty sleeping or excessive sleepiness.  Marked weight loss.  Agitation.  Feelings of worthlessness.  Lack of interest in activity or food.  Postpartum psychosis is a very serious condition and can be dangerous. Fortunately, it is rare. Displaying any of the following symptoms is cause for immediate medical attention. Symptoms of postpartum psychosis include:  Hallucinations and delusions.  Bizarre or disorganized behavior.  Confusion or disorientation.  How is this diagnosed? A diagnosis is made by an evaluation of your symptoms. There are no medical or lab tests that lead to a diagnosis, but there are various questionnaires that a health care provider may use to identify those with the baby blues, postpartum depression, or  psychosis. Often, a screening tool called the [place] Postnatal Depression Scale is used to diagnose depression in the postpartum period. How is this treated? The baby blues usually goes away on its own in 1-2 weeks. Social support is often all that is needed. You will be encouraged to get adequate sleep and rest. Occasionally, you may be given medicines to help you sleep. Postpartum depression requires treatment because it can last several months or longer if it is not treated. Treatment may include individual or group therapy, medicine, or both to address any social, physiological, and psychological factors that may play a role in the depression. Regular exercise, a healthy diet, rest, and social support may also be strongly recommended. Postpartum psychosis is more serious and needs treatment right away. Hospitalization is often needed. Follow these instructions at home:  Get as much rest as you can. Nap when the baby sleeps.  Exercise regularly. Some women find yoga and walking to be beneficial.  Eat a balanced and nourishing diet.  Do little things that you enjoy. Have a cup of tea, take a bubble bath,  read your favorite magazine, or listen to your favorite music.  Avoid alcohol.  Ask for help with household chores, cooking, grocery shopping, or running errands as needed. Do not try to do everything.  Talk to people close to you about how you are feeling. Get support from your partner, family members, friends, or other new moms.  Try to stay positive in how you think. Think about the things you are grateful for.  Do not spend a lot of time alone.  Only take over-the-counter or prescription medicine as directed by your health care provider.  Keep all your postpartum appointments.  Let your health care provider know if you have any concerns. Contact a health care provider if: You are having a reaction to or problems with your medicine. Get help right away if:  You have suicidal feelings.  You think you may harm the baby or someone else. This information is not intended to replace advice given to you by your health care provider. Make sure you discuss any questions you have with your health care provider. Document Released: 05/03/2004 Document Revised: 01/05/2016 Document Reviewed: 05/11/2013 Elsevier Interactive Patient Education  2017 [employer].

## 2018-04-03 ENCOUNTER — Telehealth: Payer: Self-pay | Admitting: *Deleted

## 2018-04-03 ENCOUNTER — Encounter: Payer: Self-pay | Admitting: Neurology

## 2018-04-03 ENCOUNTER — Ambulatory Visit (INDEPENDENT_AMBULATORY_CARE_PROVIDER_SITE_OTHER): Admitting: Neurology

## 2018-04-03 ENCOUNTER — Encounter: Payer: Self-pay | Admitting: *Deleted

## 2018-04-03 VITALS — BP 107/65 | HR 60 | Ht 63.0 in | Wt 163.0 lb

## 2018-04-03 DIAGNOSIS — R55 Syncope and collapse: Secondary | ICD-10-CM

## 2018-04-03 DIAGNOSIS — R404 Transient alteration of awareness: Secondary | ICD-10-CM

## 2018-04-03 NOTE — Progress Notes (Signed)
[facility]    Provider:  Dr Lucia Gaskins Referring Provider:  Dr. Juliene Pina Primary Care Physician:  Shea Evans  CC:  Alteration of awareness  HPI:  Kristine [name] is a 37 y.o. female here as requested by Dr. Roger Shelter for alteration of awareness. Patient had a c-section and had an alteration of awareness. The only thing she remembers is after the baby she was shivering and shaking, she felt pressure in the chest, the world getting darker, starting to sweat and felt lightheaded, voices going far away and felt she is passing out. She kept saying "I am passing out" and her face turned right and eyes on the right side and open and foam not breathing. No confusion afterwards. At [facility] she could hear people and couldn't respond. She has right-sided arm paralysis and lower extremity weakness possibly from anesthesia. No hx of seizures. No tongue biting or urination. The weakness of the right arm lasted 2 days. She has a history of "passing out" last time was when pregnant and was told she had low glucose and low BP in 2014.  No other focal neurologic deficits, associated symptoms, inciting events or modifiable factors.  Reviewed notes, labs and imaging from outside physicians, which showed:   Reviewed documentation by referring physician Dr. Juliene Pina.  Patient underwent cesarean section on March 28, 2018, her postpartum course was complicated by an unknown intraoperative neurologic event, she was transferred to the neuro ICU at [facility] she was evaluated by neurology and work-up including a CT angiogram and EEGs were all negative, after return to [facility] patient had an uncomplicated course.  Reviewed neurology inpatient notes she presented to [facility] as a code stroke after C-section under spinal anesthesia.  5 minutes after she began having shivers but she was in cold she did not know why she was shaking she started to hyperventilate her eyes rolled back and  she was no longer responding to verbal commands.  Her eyes started "becoming crazy and going all over the place" and patient was gazing to the left with inability to the right arm and was completely nonverbal.  Work-up was essentially negative and this was likely an adverse reaction to medication versus a seizure, conversion disorder was not entirely ruled out.    CT head showed No acute intracranial abnormalities including mass lesion or mass effect, hydrocephalus, extra-axial fluid collection, midline shift, hemorrhage, or acute infarction, large ischemic events (personally reviewed images)   Reviewed EEG report which was normal awake.  Review of Systems: Patient complains of symptoms per HPI as well as the following symptoms: fatigue, headache, dizziness. Pertinent negatives and positives per HPI. All others negative.   Social History   Socioeconomic History  . Marital status: Married    Spouse name: Not on file  . Number of children: 3  . Years of education: Not on file  . Highest education level: Bachelor's degree (e.g., BA, AB, BS)  Occupational History  . Not on file  Social Needs  . Financial resource strain: Not on file  . Food insecurity:    Worry: Not on file    Inability: Not on file  . Transportation needs:    Medical: Not on file    Non-medical: Not on file  Tobacco Use  . Smoking status: Never Smoker  . Smokeless tobacco: Never Used  Substance and Sexual Activity  . Alcohol use: No  . Drug use: No  . Sexual activity: Yes    Birth control/protection:  None  Lifestyle  . Physical activity:    Days per week: Not on file    Minutes per session: Not on file  . Stress: Not on file  Relationships  . Social connections:    Talks on phone: Not on file    Gets together: Not on file    Attends religious service: Not on file    Active member of club or organization: Not on file    Attends meetings of clubs or organizations: Not on file    Relationship status: Not  on file  . Intimate partner violence:    Fear of current or ex partner: Not on file    Emotionally abused: Not on file    Physically abused: Not on file    Forced sexual activity: Not on file  Other Topics Concern  . Not on file  Social History Narrative   Lives at home with her husband and children   Right handed   Caffeine: tea, >10 cups daily    Family History  Problem Relation Age of Onset  . Hypertension Mother   . Cancer Mother        uterus  . Hypertension Paternal Uncle   . Hypothyroidism Sister   . Other Neg Hx   . Hearing loss Neg Hx   . Alcohol abuse Neg Hx   . Arthritis Neg Hx   . Asthma Neg Hx   . Birth defects Neg Hx   . COPD Neg Hx   . Depression Neg Hx   . Diabetes Neg Hx   . Drug abuse Neg Hx   . Early death Neg Hx   . Heart disease Neg Hx   . Hyperlipidemia Neg Hx   . Kidney disease Neg Hx   . Learning disabilities Neg Hx   . Mental illness Neg Hx   . Mental retardation Neg Hx   . Miscarriages / Stillbirths Neg Hx   . Stroke Neg Hx   . Vision loss Neg Hx   . Seizures Neg Hx     Past Medical History:  Diagnosis Date  . Acute blood loss anemia 02/21/2013  . Anemia, iron deficiency 02/21/2013  . Cholestasis during pregnancy in second trimester 01/28/2018  . Constipation, chronic 12/12/2012  . Depression   . Previous cesarean delivery, antepartum condition or complication 11/22/2016  . Vitamin D deficiency, unspecified     Past Surgical History:  Procedure Laterality Date  . CESAREAN SECTION N/A 02/20/2013   Procedure: CESAREAN SECTION;  Surgeon: Robley FriesVaishali R Mody, MD;  Location: [facility];  Service: Obstetrics;  Laterality: N/A;  . CESAREAN SECTION N/A 11/22/2016   Procedure: REPEAT CESAREAN SECTION;  Surgeon: Shea EvansVaishali Mody, MD;  [facility];  Service: Obstetrics;  Laterality: N/A;  EDD: 11/29/16  . CESAREAN SECTION N/A 03/28/2018   Procedure: Repeat CESAREAN SECTION;  Surgeon: Shea EvansMody, Vaishali, MD;  [facility];  Service:  Obstetrics;  Laterality: N/A;  EDD: 04/16/18    Current Outpatient Medications  Medication Sig Dispense Refill  . acetaminophen (TYLENOL) 325 MG tablet Take 2 tablets (650 mg total) by mouth every 4 (four) hours as needed (for pain scale < 4). 60 tablet 0  . hydrocortisone 2.5 % cream Apply 1 application topically daily as needed for hemorrhoids.  0  . ibuprofen (ADVIL,MOTRIN) 600 MG tablet Take 1 tablet (600 mg total) by mouth every 6 (six) hours. 30 tablet 0  . iron polysaccharides (NIFEREX) 150 MG capsule Take 1 capsule (150 mg total)  by mouth daily. 30 capsule 1  . magnesium oxide (MAG-OX) 400 (241.3 Mg) MG tablet Take 1 tablet (400 mg total) by mouth daily. 30 tablet 1  . oxyCODONE (OXY IR/ROXICODONE) 5 MG immediate release tablet Take 1 tablet (5 mg total) by mouth every 4 (four) hours as needed (pain scale 4-7). 30 tablet 0  . Prenatal Multivit-Min-Fe-FA (PRENATAL VITAMINS PO) Take 1 tablet by mouth daily.     Marland Kitchen witch hazel-glycerin (TUCKS) pad Apply 1 application topically as needed for hemorrhoids. 40 each 12   No current facility-administered medications for this visit.     Allergies as of 04/03/2018  . (No Known Allergies)    Vitals: BP 107/65 (BP Location: Right Arm, Patient Position: Sitting)   Pulse 60   Ht 5\' 3"  (1.6 m)   Wt 163 lb (73.9 kg)   LMP 06/08/2017 (Exact Date)   BMI 28.87 kg/m  Last Weight:  Wt Readings from Last 1 Encounters:  04/03/18 163 lb (73.9 kg)   Last Height:   Ht Readings from Last 1 Encounters:  04/03/18 5\' 3"  (1.6 m)   Physical exam: Exam: Gen: NAD, conversant, well nourised, well groomed                     CV: RRR, no MRG. No Carotid Bruits. No peripheral edema, warm, nontender Eyes: Conjunctivae clear without exudates or hemorrhage  Neuro: Detailed Neurologic Exam  Speech:    Speech is normal; fluent and spontaneous with normal comprehension.  Cognition:    The patient is oriented to person, place, and time;     recent and  remote memory intact;     language fluent;     normal attention, concentration,     fund of knowledge Cranial Nerves:    The pupils are equal, round, and reactive to light. Attempted fundoscopiz exam could not visualize.  Visual fields are full to finger confrontation. Extraocular movements are intact. Trigeminal sensation is intact and the muscles of mastication are normal. The face is symmetric. The palate elevates in the midline. Hearing intact. Voice is normal. Shoulder shrug is normal. The tongue has normal motion without fasciculations.   Coordination:    Normal finger to nose  Gait:    No ataxia  Motor Observation:    No asymmetry, no atrophy, and no involuntary movements noted. Tone:    Normal muscle tone.    Posture:    Posture is normal. normal erect    Strength: poor effort with giveway but appears intact and symmetric in the upper and lower limbs possibly right LE prox weakness but difficult due to poor effort     Sensation: intact to LT     Reflex Exam:  DTR's:    Deep tendon reflexes in the upper and lower extremities are symmetrical bilaterally.   Toes:    The toes are downgoing bilaterally.   Clonus:    Clonus is absent.    Assessment/Plan:  Patient with episode of alteration of awareness after c-section may be vasovagal vs seizure or due to medication reaction. Also conversion disorder was suggested at [facility]. Most likely Vasovagal, exam non focal today  MRI brain w/wo contrast. Pump and dump for 24 hours.  3-day EEG [facility] Would not treat at this time with AEDs more likely was a medication reaction or vasovagal or conversion  Recommend no driving until workup complete.  Orders Placed This Encounter  Procedures  . MR BRAIN W WO CONTRAST  Cc:  Dr. Kathryne Hitch, MD  [facility] [address] [place], [place] [address]  Phone [phone] Fax [phone]

## 2018-04-03 NOTE — Patient Instructions (Signed)
Vasovagal Syncope, Adult Syncope, which is commonly known as fainting or passing out, is a temporary loss of consciousness. It occurs when the blood flow to the brain is reduced. Vasovagal syncope, also called neurocardiogenic syncope, is a fainting spell that happens when blood flow to the brain is reduced because of a sudden drop in heart rate and blood pressure. Vasovagal syncope is usually harmless. However, you can get injured if you fall during a fainting spell. What are the causes? This condition is caused by a drop in heart rate and blood pressure, usually in response to a trigger. Many things and situations can trigger an episode, including:  Pain.  Fear.  The sight of blood. This may occur during medical procedures, such as when blood is being drawn from a vein.  Common activities, such as coughing, swallowing, stretching, or going to the bathroom.  Emotional stress.  Being in a confined space.  Prolonged standing, especially in a warm environment.  Lack of sleep or rest.  Not eating for a long time.  Not drinking enough liquids.  Recent illness.  Drinking alcohol.  Taking drugs that affect blood pressure, such as marijuana, cocaine, opiates, or inhalants.  What are the signs or symptoms? Before a fainting episode, you may:  Feel dizzy or light-headed.  Become pale.  Sense that you are going to faint.  Feel like the room is spinning.  Only see directly ahead (tunnel vision).  Feel sick to your stomach (nauseous).  See spots.  Slowly lose vision.  Hear ringing in your ears.  Have a headache.  Feel warm and sweaty.  Feel a sensation of pins and needles.  During the fainting spell, you may twitch or make jerky movements. Fainting spells usually last no longer than a few minutes before you wake up. If you get up too quickly before your body can recover, you may faint again. How is this diagnosed? This condition is diagnosed based on your  symptoms, your medical history, and a physical exam. Tests may be done to rule out other causes of fainting. Tests may include:  Blood tests.  Heart tests, such as an electrocardiogram (ECG), echocardiogram, or electrophysiology study.  A test to check your response to changes in position (tilt table test).  How is this treated? Usually, treatment is not needed for this condition. Your health care provider may suggest ways to help prevent fainting episodes. These may include:  Drinking additional fluids if you are exposed to a trigger.  Sitting or lying down if you notice signs that an episode is coming.  If your fainting spells continue, your health care provider may recommend that you:  Take medicines to prevent fainting or to help reduce further episodes of fainting.  Do certain exercises.  Wear compression stockings.  Have surgery to place a pacemaker in your body (rare).  Follow these instructions at home:  Learn to identify the signs that an episode is coming.  Sit or lie down at the first sign of a fainting spell. If you sit down, put your head down between your legs. If you lie down, swing your legs up in the air to increase blood flow to the brain.  Avoid hot tubs and saunas.  Avoid standing for a long time. If you have to stand for a long time, try: ? Crossing your legs. ? Flexing and stretching your leg muscles. ? Squatting. ? Moving your legs. ? Bending over.  Drink enough fluid to keep your urine clear or  pale yellow.  Make changes to your diet that your health care provider recommends. You may be told to: ? Avoid caffeine. ? Eat more salt.  Take over-the-counter and prescription medicines only as told by your health care provider. Contact a health care provider if:  You continue to have fainting spells despite treatment.  You faint more often despite treatment.  You lose consciousness for more than a few minutes.  You faint during or after  exercising or after being startled.  You have twitching or jerky movements for longer than a few seconds during a fainting spell.  You have an episode of twitching or jerky movements without fainting. Get help right away if:  A fainting spell leads to an injury or bleeding.  You have new symptoms that occur with the fainting spells, such as: ? Shortness of breath. ? Chest pain. ? Irregular heartbeat.  You twitch or make jerky movements for more than 5 minutes.  You twitch or make jerky movements during more than one fainting spell. This information is not intended to replace advice given to you by your health care provider. Make sure you discuss any questions you have with your health care provider. Document Released: 07/16/2012 Document Revised: 01/11/2016 Document Reviewed: 05/28/2015 Elsevier Interactive Patient Education  2018 [employer].   Seizure, Adult A seizure is a sudden burst of abnormal electrical activity in the brain. The abnormal activity temporarily interrupts normal brain function, causing a person to experience any of the following: Involuntary movements. Changes in awareness or consciousness. Uncontrollable shaking (convulsions).  Seizures usually last from 30 seconds to 2 minutes. They usually do not cause permanent brain damage unless they are prolonged. What can cause a seizure to happen? Seizures can happen for many reasons including: A fever. Low blood sugar. A medicine. An illnesses. A brain injury.  Some people who have a seizure never have another one. People who have repeated seizures have a condition called epilepsy. What are the symptoms of a seizure? Symptoms of a seizure vary greatly from person to person. They include: Convulsions. Stiffening of the body. Involuntary movements of the arms or legs. Loss of consciousness. Breathing problems. Falling suddenly. Confusion. Head nodding. Eye blinking or fluttering. Lip  smacking. Drooling. Rapid eye movements. Grunting. Loss of bladder control and bowel control. Staring. Unresponsiveness.  Some people have symptoms right before a seizure happens (aura) and right after a seizure happens. Symptoms of an aura include: Fear or anxiety. Nausea. Feeling like the room is spinning (vertigo). A feeling of having seen or heard something before (deja vu). Odd tastes or smells. Changes in vision, such as seeing flashing lights or spots.  Symptoms that may follow a seizure include: Confusion. Sleepiness. Headache. Weakness of one side of the body.  Follow these instructions at home: Medicines  Take over-the-counter and prescription medicines only as told by your health care provider. Avoid any substances that may prevent your medicine from working properly, such as alcohol. Activity Do not drive, swim, or do any other activities that would be dangerous if you had another seizure. Wait until your health care provider approves. If you live in the [place] check with your local DMV (department of motor vehicles) to find out about the local driving laws. Each state has specific rules about when you can legally return to driving. Get enough rest. Lack of sleep can make seizures more likely to occur. Educating others Teach friends and family what to do if you have a seizure. They should: Geanie Cooley  you on the ground to prevent a fall. Cushion your head and body. Loosen any tight clothing around your neck. Turn you on your side. If vomiting occurs, this helps keep your airway clear. Stay with you until you recover. Not hold you down. Holding you down will not stop the seizure. Not put anything in your mouth. Know whether or not you need emergency care.  General instructions Contact your health care provider each time you have a seizure. Avoid anything that has ever triggered a seizure for you. Keep a seizure diary. Record what you remember about each seizure,  especially anything that might have triggered the seizure. Keep all follow-up visits as told by your health care provider. This is important. Contact a health care provider if: You have another seizure. You have seizures more often. Your seizure symptoms change. You continue to have seizures with treatment. You have symptoms of an infection or illness. They might increase your risk of having a seizure. Get help right away if: You have a seizure: That lasts longer than 5 minutes. That is different than previous seizures. That leaves you unable to speak or use a part of your body. That makes it harder to breathe. After a head injury. You have: Multiple seizures in a row. Confusion or a severe headache right after a seizure. You are having seizures more often. You do not wake up immediately after a seizure. You injure yourself during a seizure. These symptoms may represent a serious problem that is an emergency. Do not wait to see if the symptoms will go away. Get medical help right away. Call your local emergency services (911 in the [place] Do not drive yourself to the hospital. This information is not intended to replace advice given to you by your health care provider. Make sure you discuss any questions you have with your health care provider. Document Released: 07/27/2000 Document Revised: 03/25/2016 Document Reviewed: 03/02/2016 Elsevier Interactive Patient Education  [employer].

## 2018-04-03 NOTE — Telephone Encounter (Signed)
Faxed signed referral for 48 AMB EEG w/ video to [facility] along with insurance info, demographics, office note from 04/03/2018 and EEG. Received a receipt of confirmation.

## 2018-04-04 LAB — GLUCOSE, CAPILLARY: Glucose-Capillary: 99 mg/dL (ref 70–99)

## 2018-04-07 NOTE — Telephone Encounter (Signed)
[facility] that patient's referral has been received and is being processed for AMB EEG. Anticipate another status update when patient has been scheduled.

## 2018-04-08 ENCOUNTER — Ambulatory Visit
Admission: RE | Admit: 2018-04-08 | Discharge: 2018-04-08 | Disposition: A | Discharge status: Home / Self Care | Source: Ambulatory Visit | Attending: Neurology | Admitting: Neurology

## 2018-04-08 DIAGNOSIS — R404 Transient alteration of awareness: Secondary | ICD-10-CM | POA: Diagnosis not present

## 2018-04-08 DIAGNOSIS — R55 Syncope and collapse: Secondary | ICD-10-CM

## 2018-04-08 MED ORDER — GADOBENATE DIMEGLUMINE 529 MG/ML IV SOLN
15.0000 mL | Freq: Once | INTRAVENOUS | Status: AC | PRN
  Administered 2018-04-08: 15 mL via INTRAVENOUS

## 2018-04-16 ENCOUNTER — Telehealth: Payer: Self-pay | Admitting: *Deleted

## 2018-04-16 NOTE — Telephone Encounter (Signed)
Called pt and informed her that her MRI brain is normal. She verbalized understanding and appreciation. She stated that she is waiting to hear back from [facility]. She was told they are working on English as a second language teacher. RN gave pt the phone number of [place] and advised pt to call if she doesn't hear this week and to call our office if she is unable to reach them. Pt verbalized appreciation.

## 2018-04-16 NOTE — Telephone Encounter (Signed)
-----   Message from Anson Fret, MD sent at 04/14/2018  7:02 PM EDT ----- MRI brain normal thanks

## 2018-04-21 ENCOUNTER — Encounter: Payer: Self-pay | Admitting: Internal Medicine

## 2018-04-22 ENCOUNTER — Ambulatory Visit (INDEPENDENT_AMBULATORY_CARE_PROVIDER_SITE_OTHER): Admitting: Internal Medicine

## 2018-04-22 ENCOUNTER — Encounter: Payer: Self-pay | Admitting: Internal Medicine

## 2018-04-22 VITALS — BP 102/80 | HR 77 | Ht 63.0 in | Wt 157.0 lb

## 2018-04-22 DIAGNOSIS — R55 Syncope and collapse: Secondary | ICD-10-CM | POA: Diagnosis not present

## 2018-04-22 DIAGNOSIS — R42 Dizziness and giddiness: Secondary | ICD-10-CM | POA: Diagnosis not present

## 2018-04-22 NOTE — Patient Instructions (Addendum)
Pt had to leave urgently near end of appointment. Dr. Tenny Craw will review with Dr. Juliene Pina. No AVS given.

## 2018-04-22 NOTE — Progress Notes (Signed)
Cardiology Office Note   Date:  04/22/2018   ID:  [name], DOB 1981-08-13, MRN [id number]  PCP:  Jordan Hawks, PA-C  Cardiologist:   Dietrich Pates, MD   Patient referred    History of Present Illness: Kristine [name] is a 37 y.o. female with a history of altered mental status after c section.   Complained of chest pressure then tunneling of things   Became dizzy, sweaty.   Associated with with was gaze preference and paralysis R arm, weakness in R leg  Weakness lasted 2 days The pt says she has been dizzy in past   Often with change in position. She had a syncopal spell with last pregnancy  2014  Near start of second trimester  In hospital she had unresponsive spell   L gaze preference.  R arm   The pt is being evaluated in neurology     Active at home    She is difficlt historian          Current Meds  Medication Sig  . acetaminophen (TYLENOL) 325 MG tablet Take 2 tablets (650 mg total) by mouth every 4 (four) hours as needed (for pain scale < 4).  . hydrocortisone 2.5 % cream Apply 1 application topically daily as needed for hemorrhoids.  Marland Kitchen ibuprofen (ADVIL,MOTRIN) 600 MG tablet Take 1 tablet (600 mg total) by mouth every 6 (six) hours.  . iron polysaccharides (NIFEREX) 150 MG capsule Take 1 capsule (150 mg total) by mouth daily.  . magnesium oxide (MAG-OX) 400 (241.3 Mg) MG tablet Take 1 tablet (400 mg total) by mouth daily.  . Prenatal Multivit-Min-Fe-FA (PRENATAL VITAMINS PO) Take 1 tablet by mouth daily.      Allergies:   Patient has no known allergies.   Past Medical History:  Diagnosis Date  . Acute blood loss anemia 02/21/2013  . Anemia, iron deficiency 02/21/2013  . Cholestasis during pregnancy in second trimester 01/28/2018  . Constipation, chronic 12/12/2012  . Depression   . Previous cesarean delivery, antepartum condition or complication 11/22/2016  . Vitamin D deficiency, unspecified     Past Surgical History:  Procedure Laterality Date  .  CESAREAN SECTION N/A 02/20/2013   Procedure: CESAREAN SECTION;  Surgeon: Robley Fries, MD;  Location: [facility];  Service: Obstetrics;  Laterality: N/A;  . CESAREAN SECTION N/A 11/22/2016   Procedure: REPEAT CESAREAN SECTION;  Surgeon: Shea Evans, MD;  Location: [facility] BIRTHING SUITES;  Service: Obstetrics;  Laterality: N/A;  EDD: 11/29/16  . CESAREAN SECTION N/A 03/28/2018   Procedure: Repeat CESAREAN SECTION;  Surgeon: Shea Evans, MD;  Location: [facility] BIRTHING SUITES;  Service: Obstetrics;  Laterality: N/A;  EDD: 04/16/18     Social History:  The patient  reports that she has never smoked. She has never used smokeless tobacco. She reports that she does not drink alcohol or use drugs.   Family History:  The patient's family history includes Cancer in her mother; Hypertension in her mother and paternal uncle; Hypothyroidism in her sister.    ROS:  Please see the history of present illness. All other systems are reviewed and  Negative to the above problem except as noted.    PHYSICAL EXAM: VS:  BP 102/80   Pulse 77   Ht 5\' 3"  (1.6 m)   Wt 157 lb (71.2 kg)   LMP 06/08/2017 (Exact Date)   SpO2 97%   BMI 27.81 kg/m    BP / P laying   99/72   P 68  Sitting   BP 110/82  P 73   Standing 2 min 108/82  P 74   4 min  110/83  P 74 GEN: Well nourished, well developed, in no acute distress  HEENT: normal  Neck: no JVD, carotid bruits, or masses Cardiac: RRR; no murmurs, rubs, or gallops,no edema  Respiratory:  clear to auscultation bilaterally, normal work of breathing GI: soft, nontender, nondistended, + BS  No hepatomegaly  MS: no deformity Moving all extremities   Skin: warm and dry, no rash Neuro:  Strength and sensation are intact Psych: euthymic mood, full affect   EKG:  EKG is ordered today.  SR 77 bpm     Lipid Panel No results found for: CHOL, TRIG, HDL, CHOLHDL, VLDL, LDLCALC, LDLDIRECT    Wt Readings from Last 3 Encounters:  04/22/18 157 lb (71.2 kg)  04/03/18 163 lb (73.9  kg)  03/30/18 163 lb 4.8 oz (74.1 kg)      ASSESSMENT AND PLAN:  Pt is a 37 yo who had a syncopal spell several years ago and unresponsive spells at time of C section and after    She is a very difficult historian  Ideas jump, difficlut to follow answers On exam, she is not orthostatic She had to leave early   Home health was setting up for EEG      Will review with Dr Juliene Pina     She may have some orhostatic tendencies that are periodic    I encouraged her before she left to stay active, stay hydrated  Watch for sudden changes in position\\   Current medicines are reviewed at length with the patient today.  The patient does not have concerns regarding medicines.  Signed, Dietrich Pates, MD  04/22/2018 10:30 AM    [facility] [address] [place], [place], [place]  [address] Phone: [phone]; Fax: [phone]

## 2018-04-22 NOTE — Telephone Encounter (Signed)
Received notification from [facility] that patient's AMB 48 hour EEG has been scheduled.    Set-up date: 04/22/2018 Study disconnection date: 04/24/2018

## 2018-04-24 ENCOUNTER — Encounter: Payer: Self-pay | Admitting: Neurology

## 2018-05-08 NOTE — Telephone Encounter (Signed)
Received results for AMB 48 hr EEG. EEG was normal per Dr. Lucia Gaskins. Copy sent to medical records. Called pt and let her know the EEG was normal. She verbalized appreciation and asked if she needed a follow up with MD. D/w Dr. Lucia Gaskins and stated she did not need to follow up. Pt verbalized understanding and appreciation.

## 2018-05-29 ENCOUNTER — Other Ambulatory Visit (HOSPITAL_COMMUNITY): Payer: Self-pay | Admitting: Obstetrics & Gynecology

## 2018-05-29 DIAGNOSIS — Q2112 Patent foramen ovale: Secondary | ICD-10-CM

## 2018-05-29 DIAGNOSIS — Q211 Atrial septal defect: Secondary | ICD-10-CM

## 2018-06-04 ENCOUNTER — Ambulatory Visit (HOSPITAL_COMMUNITY): Attending: Cardiology

## 2018-06-04 ENCOUNTER — Other Ambulatory Visit: Payer: Self-pay

## 2018-06-04 DIAGNOSIS — Q2112 Patent foramen ovale: Secondary | ICD-10-CM

## 2018-06-04 DIAGNOSIS — Q211 Atrial septal defect: Secondary | ICD-10-CM | POA: Insufficient documentation

## 2018-07-02 ENCOUNTER — Emergency Department (HOSPITAL_COMMUNITY)

## 2018-07-02 ENCOUNTER — Encounter (HOSPITAL_COMMUNITY): Payer: Self-pay

## 2018-07-02 ENCOUNTER — Other Ambulatory Visit: Payer: Self-pay

## 2018-07-02 ENCOUNTER — Emergency Department (HOSPITAL_COMMUNITY)
Admission: EM | Admit: 2018-07-02 | Discharge: 2018-07-02 | Disposition: A | Discharge status: Home / Self Care | Attending: Emergency Medicine | Admitting: Emergency Medicine

## 2018-07-02 DIAGNOSIS — R748 Abnormal levels of other serum enzymes: Secondary | ICD-10-CM | POA: Insufficient documentation

## 2018-07-02 DIAGNOSIS — R1011 Right upper quadrant pain: Secondary | ICD-10-CM

## 2018-07-02 DIAGNOSIS — F329 Major depressive disorder, single episode, unspecified: Secondary | ICD-10-CM | POA: Insufficient documentation

## 2018-07-02 DIAGNOSIS — J189 Pneumonia, unspecified organism: Secondary | ICD-10-CM | POA: Diagnosis not present

## 2018-07-02 LAB — CBC WITH DIFFERENTIAL/PLATELET
ABS IMMATURE GRANULOCYTES: 0.02 10*3/uL (ref 0.00–0.07)
BASOS ABS: 0 10*3/uL (ref 0.0–0.1)
Basophils Relative: 0 %
EOS PCT: 1 %
Eosinophils Absolute: 0.1 10*3/uL (ref 0.0–0.5)
HCT: 33.8 % — ABNORMAL LOW (ref 36.0–46.0)
HEMOGLOBIN: 10.9 g/dL — AB (ref 12.0–15.0)
Immature Granulocytes: 0 %
Lymphocytes Relative: 30 %
Lymphs Abs: 2.1 10*3/uL (ref 0.7–4.0)
MCH: 28.8 pg (ref 26.0–34.0)
MCHC: 32.2 g/dL (ref 30.0–36.0)
MCV: 89.2 fL (ref 80.0–100.0)
Monocytes Absolute: 0.7 10*3/uL (ref 0.1–1.0)
Monocytes Relative: 9 %
NRBC: 0 % (ref 0.0–0.2)
Neutro Abs: 4.2 10*3/uL (ref 1.7–7.7)
Neutrophils Relative %: 60 %
Platelets: 207 10*3/uL (ref 150–400)
RBC: 3.79 MIL/uL — AB (ref 3.87–5.11)
RDW: 13.2 % (ref 11.5–15.5)
WBC: 7.1 10*3/uL (ref 4.0–10.5)

## 2018-07-02 LAB — COMPREHENSIVE METABOLIC PANEL
ALBUMIN: 3.7 g/dL (ref 3.5–5.0)
ALK PHOS: 159 U/L — AB (ref 38–126)
ALT: 69 U/L — AB (ref 0–44)
ANION GAP: 10 (ref 5–15)
AST: 53 U/L — ABNORMAL HIGH (ref 15–41)
BILIRUBIN TOTAL: 0.9 mg/dL (ref 0.3–1.2)
BUN: 7 mg/dL (ref 6–20)
CALCIUM: 8.7 mg/dL — AB (ref 8.9–10.3)
CO2: 23 mmol/L (ref 22–32)
Chloride: 102 mmol/L (ref 98–111)
Creatinine, Ser: 0.65 mg/dL (ref 0.44–1.00)
GFR calc Af Amer: >60 mL/min (ref >60)
GFR calc non Af Amer: >60 mL/min (ref >60)
GLUCOSE: 126 mg/dL — AB (ref 70–99)
Potassium: 3.3 mmol/L — ABNORMAL LOW (ref 3.5–5.1)
SODIUM: 135 mmol/L (ref 135–145)
TOTAL PROTEIN: 7.1 g/dL (ref 6.5–8.1)

## 2018-07-02 LAB — URINALYSIS, ROUTINE W REFLEX MICROSCOPIC
BILIRUBIN URINE: NEGATIVE
Glucose, UA: NEGATIVE mg/dL
KETONES UR: NEGATIVE mg/dL
Nitrite: POSITIVE — AB
PH: 6 (ref 5.0–8.0)
PROTEIN: NEGATIVE mg/dL
Specific Gravity, Urine: 1.004 — ABNORMAL LOW (ref 1.005–1.030)

## 2018-07-02 LAB — LIPASE, BLOOD: Lipase: 23 U/L (ref 11–51)

## 2018-07-02 LAB — I-STAT BETA HCG BLOOD, ED (MC, WL, AP ONLY): I-stat hCG, quantitative: <5 m[IU]/mL (ref <5)

## 2018-07-02 MED ORDER — SODIUM CHLORIDE 0.9 % IV BOLUS
1000.0000 mL | Freq: Once | INTRAVENOUS | Status: AC
  Administered 2018-07-02: 1000 mL via INTRAVENOUS

## 2018-07-02 MED ORDER — ACETAMINOPHEN 325 MG PO TABS
650.0000 mg | ORAL_TABLET | Freq: Once | ORAL | Status: AC
  Administered 2018-07-02: 650 mg via ORAL
  Filled 2018-07-02: qty 2

## 2018-07-02 MED ORDER — AZITHROMYCIN 250 MG PO TABS: 250.0000 mg | ORAL_TABLET | Freq: Every day | ORAL | 0 refills | Status: DC

## 2018-07-02 MED ORDER — BENZONATATE 100 MG PO CAPS: 100.0000 mg | ORAL_CAPSULE | Freq: Three times a day (TID) | ORAL | 0 refills | Status: DC

## 2018-07-02 MED ORDER — POTASSIUM CHLORIDE CRYS ER 20 MEQ PO TBCR
40.0000 meq | EXTENDED_RELEASE_TABLET | Freq: Once | ORAL | Status: AC
  Administered 2018-07-02: 40 meq via ORAL
  Filled 2018-07-02: qty 2

## 2018-07-02 NOTE — ED Notes (Signed)
Patient transported to X-ray 

## 2018-07-02 NOTE — ED Notes (Signed)
Urine culture save tube sent down with urine specimen.  

## 2018-07-02 NOTE — ED Provider Notes (Signed)
[facility] EMERGENCY DEPARTMENT Provider Note   CSN: [id number] Arrival date & time: 07/02/18  1858     History   Chief Complaint Chief Complaint  Patient presents with  . Abdominal Pain  . Cough    HPI Kristine [name] is a 37 y.o. female with past medical history of iron deficiency anemia, cholestasis, who presents to ED for evaluation of 5-day history of right upper quadrant abdominal pain, generalized body aches, nausea, and fever with T-max 102.  States that she was seen and evaluated by her PCP 2 days ago and had negative flu test.  She was given something for her cough.  However, she states that she continues to have a fever and generalized body aches.  No sick contacts with similar symptoms.  She did receive her influenza vaccine this year.  She did take one antipyretic prior to arrival.  She is currently breast-feeding.  Denies any chest pain, diarrhea, urinary symptoms, vaginal complaints, abnormal bleeding, recent travel.  HPI  Past Medical History:  Diagnosis Date  . Acute blood loss anemia 02/21/2013  . Anemia, iron deficiency 02/21/2013  . Cholestasis during pregnancy in second trimester 01/28/2018  . Constipation, chronic 12/12/2012  . Depression   . Previous cesarean delivery, antepartum condition or complication 11/22/2016  . Vitamin D deficiency, unspecified     Patient Active Problem List   Diagnosis Date Noted  . Syncope 03/28/2018  . PP C/S (8/16) 03/28/2018  . Cholestasis during pregnancy in second trimester 01/28/2018  . MDD (major depressive disorder), recurrent episode, moderate (HCC) 10/30/2017  . Perinatal depression in first trimester 10/01/2017  . Previous cesarean delivery, antepartum condition or complication 11/22/2016  . Cesarean delivery delivered 11/22/2016  . Postpartum care following cesarean delivery (4/12) 11/22/2016  . Hemorrhoids, external, thrombosed s/p excision 12/12/2012 12/12/2012  . Constipation, chronic  12/12/2012    Past Surgical History:  Procedure Laterality Date  . CESAREAN SECTION N/A 02/20/2013   Procedure: CESAREAN SECTION;  Surgeon: Robley Fries, MD;  Location: [facility];  Service: Obstetrics;  Laterality: N/A;  . CESAREAN SECTION N/A 11/22/2016   Procedure: REPEAT CESAREAN SECTION;  Surgeon: Shea Evans, MD;  Location: [facility] BIRTHING SUITES;  Service: Obstetrics;  Laterality: N/A;  EDD: 11/29/16  . CESAREAN SECTION N/A 03/28/2018   Procedure: Repeat CESAREAN SECTION;  Surgeon: Shea Evans, MD;  Location: [facility] BIRTHING SUITES;  Service: Obstetrics;  Laterality: N/A;  EDD: 04/16/18     OB History    Gravida  3   Para  3   Term  3   Preterm      AB      Living  3     SAB      TAB      Ectopic      Multiple  0   Live Births  3            Home Medications    Prior to Admission medications   Medication Sig Start Date End Date Taking? Authorizing Provider  HYDROMET 5-1.5 MG/5ML syrup Take 2.5-5 mLs by mouth every 8 (eight) hours as needed for cough or pain. 06/30/18  Yes [provider]  ibuprofen (ADVIL,MOTRIN) 600 MG tablet Take 1 tablet (600 mg total) by mouth every 6 (six) hours. Patient taking differently: Take 600 mg by mouth every 6 (six) hours as needed for moderate pain.  03/30/18  Yes Lawhorn, Vanessa Hazleton, CNM  ranitidine (ZANTAC) 150 MG tablet Take 1 tablet by mouth 2 (two)  times daily. 06/30/18  Yes [provider]  acetaminophen (TYLENOL) 325 MG tablet Take 2 tablets (650 mg total) by mouth every 4 (four) hours as needed (for pain scale < 4). Patient not taking: Reported on 07/02/2018 03/30/18   Gunnar Bulla, CNM  azithromycin (ZITHROMAX) 250 MG tablet Take 1 tablet (250 mg total) by mouth daily. Take first 2 tablets together, then 1 every day until finished. 07/02/18   Florine Sprenkle, PA-C  benzonatate (TESSALON) 100 MG capsule Take 1 capsule (100 mg total) by mouth every 8 (eight) hours. 07/02/18   Deicy Rusk, PA-C    iron polysaccharides (NIFEREX) 150 MG capsule Take 1 capsule (150 mg total) by mouth daily. Patient not taking: Reported on 07/02/2018 03/30/18   Gunnar Bulla, CNM  magnesium oxide (MAG-OX) 400 (241.3 Mg) MG tablet Take 1 tablet (400 mg total) by mouth daily. Patient not taking: Reported on 07/02/2018 03/30/18   Gunnar Bulla, CNM    Family History Family History  Problem Relation Age of Onset  . Hypertension Mother   . Cancer Mother        uterus  . Hypertension Paternal Uncle   . Hypothyroidism Sister   . Other Neg Hx   . Hearing loss Neg Hx   . Alcohol abuse Neg Hx   . Arthritis Neg Hx   . Asthma Neg Hx   . Birth defects Neg Hx   . COPD Neg Hx   . Depression Neg Hx   . Diabetes Neg Hx   . Drug abuse Neg Hx   . Early death Neg Hx   . Heart disease Neg Hx   . Hyperlipidemia Neg Hx   . Kidney disease Neg Hx   . Learning disabilities Neg Hx   . Mental illness Neg Hx   . Mental retardation Neg Hx   . Miscarriages / Stillbirths Neg Hx   . Stroke Neg Hx   . Vision loss Neg Hx   . Seizures Neg Hx     Social History Social History   Tobacco Use  . Smoking status: Never Smoker  . Smokeless tobacco: Never Used  Substance Use Topics  . Alcohol use: No  . Drug use: No     Allergies   Patient has no known allergies.   Review of Systems Review of Systems  Constitutional: Positive for fever. Negative for appetite change and chills.  HENT: Negative for ear pain, rhinorrhea, sneezing and sore throat.   Eyes: Negative for photophobia and visual disturbance.  Respiratory: Positive for cough. Negative for chest tightness, shortness of breath and wheezing.   Cardiovascular: Negative for chest pain and palpitations.  Gastrointestinal: Positive for abdominal pain and nausea. Negative for blood in stool, constipation, diarrhea and vomiting.  Genitourinary: Negative for dysuria, hematuria and urgency.  Musculoskeletal: Positive for myalgias.  Skin:  Negative for rash.  Neurological: Negative for dizziness, weakness and light-headedness.     Physical Exam Updated Vital Signs BP 111/77   Pulse 93   Temp 100.3 F (37.9 C) Comment: Pt states she took 600mg  Ibuprofen at 4pm today because her temp was 102F  Resp 18   Ht 5\' 3"  (1.6 m)   Wt 68 kg   SpO2 98%   Breastfeeding? Yes Comment: IUD placed in october 2019  BMI 26.57 kg/m   Physical Exam  Constitutional: She appears well-developed and well-nourished. No distress.  HENT:  Head: Normocephalic and atraumatic.  Nose: Nose normal.  Eyes: Conjunctivae and EOM are normal.  Left eye exhibits no discharge. No scleral icterus.  Neck: Normal range of motion. Neck supple.  Cardiovascular: Normal rate, regular rhythm, normal heart sounds and intact distal pulses. Exam reveals no gallop and no friction rub.  No murmur heard. Pulmonary/Chest: Effort normal and breath sounds normal. No respiratory distress.  Abdominal: Soft. Bowel sounds are normal. She exhibits no distension. There is tenderness in the right upper quadrant, epigastric area and left upper quadrant. There is no guarding.  Musculoskeletal: Normal range of motion. She exhibits no edema.  Neurological: She is alert. She exhibits normal muscle tone. Coordination normal.  Skin: Skin is warm and dry. No rash noted.  Psychiatric: She has a normal mood and affect.  Nursing note and vitals reviewed.    ED Treatments / Results  Labs (all labs ordered are listed, but only abnormal results are displayed) Labs Reviewed  CBC WITH DIFFERENTIAL/PLATELET - Abnormal; Notable for the following components:      Result Value   RBC 3.79 (*)    Hemoglobin 10.9 (*)    HCT 33.8 (*)    All other components within normal limits  COMPREHENSIVE METABOLIC PANEL - Abnormal; Notable for the following components:   Potassium 3.3 (*)    Glucose, Bld 126 (*)    Calcium 8.7 (*)    AST 53 (*)    ALT 69 (*)    Alkaline Phosphatase 159 (*)    All  other components within normal limits  URINALYSIS, ROUTINE W REFLEX MICROSCOPIC - Abnormal; Notable for the following components:   Specific Gravity, Urine 1.004 (*)    Hgb urine dipstick LARGE (*)    Nitrite POSITIVE (*)    Leukocytes, UA SMALL (*)    Bacteria, UA RARE (*)    All other components within normal limits  LIPASE, BLOOD  I-STAT BETA HCG BLOOD, ED ([facility], [facility], [facility] ONLY)    EKG None  Radiology Dg Chest 2 View  Result Date: 07/02/2018 CLINICAL DATA:  Initial evaluation for acute cough, fever. EXAM: CHEST - 2 VIEW COMPARISON:  Prior radiograph from 03/28/2018. FINDINGS: Cardiac and mediastinal silhouettes are stable in size and contour, and remain within normal limits. Lungs normally inflated. Patchy multifocal right perihilar and upper lobe opacities, consistent with pneumonia. Lungs are otherwise clear. No pulmonary edema or pleural effusion. No pneumothorax. No acute osseus abnormality. IMPRESSION: Patchy multifocal right perihilar and upper lobe opacities, consistent with pneumonia. Electronically Signed   By: Rise MuBenjamin  McClintock M.D.   On: 07/02/2018 19:50   [place] Abdomen Limited Ruq  Result Date: 07/02/2018 CLINICAL DATA:  Right upper quadrant abdominal pain EXAM: ULTRASOUND ABDOMEN LIMITED RIGHT UPPER QUADRANT COMPARISON:  None. FINDINGS: Gallbladder: Contracted gallbladder. Gallbladder wall thickness 3 mm, probably related to the degree of contraction. Sonographic Murphy's sign absent. No gallstones identified. Common bile duct: Diameter: 3 mm Liver: No focal lesion identified. Within normal limits in parenchymal echogenicity. Portal vein is patent on color Doppler imaging with normal direction of blood flow towards the liver. IMPRESSION: 1. Borderline gallbladder wall thickening is attributable to the contracted state of the gallbladder. No gallstones or sonographic Murphy sign. 2. The liver and CBD appear unremarkable. Electronically Signed   By: Gaylyn RongWalter  Liebkemann M.D.   On:  07/02/2018 21:28    Procedures Procedures (including critical care time)  Medications Ordered in ED Medications  potassium chloride SA (K-DUR,KLOR-CON) CR tablet 40 mEq (has no administration in time range)  sodium chloride 0.9 % bolus 1,000 mL (0 mLs Intravenous Stopped 07/02/18 2049)  acetaminophen (TYLENOL) tablet 650 mg (650 mg Oral Given 07/02/18 2003)     Initial Impression / Assessment and Plan / ED Course  I have reviewed the triage vital signs and the nursing notes.  Pertinent labs & imaging results that were available during my care of the patient were reviewed by me and considered in my medical decision making (see chart for details).     37 year old female with past medical history of IDA, cholestasis during pregnancy presents to ED for 5-day history of right upper quadrant abdominal pain, epigastric pain, generalized body aches, nausea and fever with T-max 102.  She was seen by her PCP 2 days ago and had negative flu test.  She is concerned because she continues to have symptoms, despite the use of her antitussive as prescribed by her PCP.  On exam there is some tenderness palpation of the right upper quadrant, and epigastric area.  No rebound or guarding noted.  She is afebrile.  Lab work significant for increase in LFTs, mild hypokalemia 3.3.  This was repleted orally.  CBC unremarkable.  hCG is negative.  Urine shows positive nitrites, leukocytes and rare bacteria.  She denies any dysuria or urinary frequency.  Right upper quadrant ultrasound is negative.  Chest x-ray shows possible right-sided pneumonia.  Patient given fluids here with improvement in her slight tachycardia.  Suspect that her symptoms are due to pneumonia.  I did advise her to have her LFTs rechecked at her PCPs office.  She reports improvement in her symptoms since arrival in the ED.  Will give her antitussives, antibiotics for pneumonia.  She is currently breast-feeding and her child is 65 months old so I  advised her to pre-cautious.  Will advise her to return to ED for any severe worsening symptoms.  Patient is hemodynamically stable, in NAD, and able to ambulate in the ED. Evaluation does not show pathology that would require ongoing emergent intervention or inpatient treatment. I explained the diagnosis to the patient. Pain has been managed and has no complaints prior to discharge. Patient is comfortable with above plan and is stable for discharge at this time. All questions were answered prior to disposition. Strict return precautions for returning to the ED were discussed. Encouraged follow up with PCP.    Portions of this note were generated with Scientist, clinical (histocompatibility and immunogenetics). Dictation errors may occur despite best attempts at proofreading.   Final Clinical Impressions(s) / ED Diagnoses   Final diagnoses:  RUQ pain  Community acquired pneumonia of right lung, unspecified part of lung  Elevated liver enzymes    ED Discharge Orders         Ordered    azithromycin (ZITHROMAX) 250 MG tablet  Daily     07/02/18 2210    benzonatate (TESSALON) 100 MG capsule  Every 8 hours     07/02/18 2210           Dietrich Pates, PA-C 07/02/18 2215    Margarita Grizzle, MD 07/02/18 2325

## 2018-07-02 NOTE — ED Triage Notes (Signed)
Pt endorses upper abdominal pain and back pain rated 8/10 since Saturday. Pt reports some nausea, but denies vomiting or diarrhea. Pt reports fever of 102 on Saturday and a non-productive cough.

## 2018-07-02 NOTE — Discharge Instructions (Signed)
Please take the antibiotics for pneumonia.  You can take Tylenol as needed for fever and body aches. Please make sure you are drinking water and eating enough food to stay hydrated. Take Zofran as needed for nausea. You will need to have your liver test rechecked at your doctor's office within the next week.  These were high today. Return to ED for worsening symptoms, chest pain, vomiting or coughing up blood, lightheadedness, loss of consciousness or leg swelling.

## 2019-03-11 ENCOUNTER — Emergency Department (HOSPITAL_COMMUNITY)
Admission: EM | Admit: 2019-03-11 | Discharge: 2019-03-11 | Disposition: A | Discharge status: Home / Self Care | Attending: Emergency Medicine | Admitting: Emergency Medicine

## 2019-03-11 ENCOUNTER — Emergency Department (HOSPITAL_COMMUNITY)

## 2019-03-11 ENCOUNTER — Encounter (HOSPITAL_COMMUNITY): Payer: Self-pay | Admitting: *Deleted

## 2019-03-11 ENCOUNTER — Other Ambulatory Visit: Payer: Self-pay

## 2019-03-11 DIAGNOSIS — Z79899 Other long term (current) drug therapy: Secondary | ICD-10-CM | POA: Insufficient documentation

## 2019-03-11 DIAGNOSIS — F430 Acute stress reaction: Secondary | ICD-10-CM | POA: Insufficient documentation

## 2019-03-11 DIAGNOSIS — F410 Panic disorder [episodic paroxysmal anxiety] without agoraphobia: Secondary | ICD-10-CM

## 2019-03-11 DIAGNOSIS — R55 Syncope and collapse: Secondary | ICD-10-CM | POA: Insufficient documentation

## 2019-03-11 LAB — CBC
HCT: 37.2 % (ref 36.0–46.0)
Hemoglobin: 12.4 g/dL (ref 12.0–15.0)
MCH: 28.7 pg (ref 26.0–34.0)
MCHC: 33.3 g/dL (ref 30.0–36.0)
MCV: 86.1 fL (ref 80.0–100.0)
Platelets: 280 10*3/uL (ref 150–400)
RBC: 4.32 MIL/uL (ref 3.87–5.11)
RDW: 12.6 % (ref 11.5–15.5)
WBC: 5.9 10*3/uL (ref 4.0–10.5)
nRBC: 0 % (ref 0.0–0.2)

## 2019-03-11 LAB — URINALYSIS, ROUTINE W REFLEX MICROSCOPIC
Bacteria, UA: NONE SEEN
Bilirubin Urine: NEGATIVE
Glucose, UA: NEGATIVE mg/dL
Ketones, ur: NEGATIVE mg/dL
Leukocytes,Ua: NEGATIVE
Nitrite: NEGATIVE
Protein, ur: NEGATIVE mg/dL
Specific Gravity, Urine: 1.015 (ref 1.005–1.030)
pH: 7 (ref 5.0–8.0)

## 2019-03-11 LAB — BASIC METABOLIC PANEL
Anion gap: 8 (ref 5–15)
BUN: 13 mg/dL (ref 6–20)
CO2: 25 mmol/L (ref 22–32)
Calcium: 9.4 mg/dL (ref 8.9–10.3)
Chloride: 105 mmol/L (ref 98–111)
Creatinine, Ser: 0.47 mg/dL (ref 0.44–1.00)
GFR calc Af Amer: >60 mL/min (ref >60)
GFR calc non Af Amer: >60 mL/min (ref >60)
Glucose, Bld: 96 mg/dL (ref 70–99)
Potassium: 3.5 mmol/L (ref 3.5–5.1)
Sodium: 138 mmol/L (ref 135–145)

## 2019-03-11 LAB — I-STAT BETA HCG BLOOD, ED (MC, WL, AP ONLY): I-stat hCG, quantitative: <5 m[IU]/mL (ref <5)

## 2019-03-11 LAB — CBG MONITORING, ED: Glucose-Capillary: 95 mg/dL (ref 70–99)

## 2019-03-11 MED ORDER — LORAZEPAM 1 MG PO TABS
1.0000 mg | ORAL_TABLET | Freq: Once | ORAL | Status: DC
  Filled 2019-03-11: qty 1

## 2019-03-11 MED ORDER — KETOROLAC TROMETHAMINE 15 MG/ML IJ SOLN
15.0000 mg | Freq: Once | INTRAMUSCULAR | Status: AC
  Administered 2019-03-11: 15 mg via INTRAVENOUS
  Filled 2019-03-11: qty 1

## 2019-03-11 MED ORDER — ACETAMINOPHEN 325 MG PO TABS
650.0000 mg | ORAL_TABLET | Freq: Once | ORAL | Status: AC
  Administered 2019-03-11: 650 mg via ORAL
  Filled 2019-03-11: qty 2

## 2019-03-11 MED ORDER — METOCLOPRAMIDE HCL 5 MG/ML IJ SOLN
10.0000 mg | Freq: Once | INTRAMUSCULAR | Status: AC
  Administered 2019-03-11: 10 mg via INTRAVENOUS
  Filled 2019-03-11: qty 2

## 2019-03-11 MED ORDER — SODIUM CHLORIDE 0.9% FLUSH
3.0000 mL | Freq: Once | INTRAVENOUS | Status: AC
  Administered 2019-03-11: 3 mL via INTRAVENOUS

## 2019-03-11 MED ORDER — SODIUM CHLORIDE 0.9 % IV BOLUS
500.0000 mL | Freq: Once | INTRAVENOUS | Status: AC
  Administered 2019-03-11: 500 mL via INTRAVENOUS

## 2019-03-11 NOTE — Discharge Instructions (Addendum)
We saw in the ER after you had a fainting spell.  Evaluation in the ER did not reveal any abnormalities. CT scan of your brain and neck does not reveal any evidence of fracture or internal bleeding.  We recommend that you follow-up with your primary care doctor for further evaluation. It appears to [place] that you might have had a panic attack is a reaction to the stress. Your primary care physician can decide if you need further evaluation for fainting, such as cardiac monitoring device.

## 2019-03-11 NOTE — ED Provider Notes (Signed)
[facility] Provider Note   CSN: [id number] Arrival date & time: 03/11/19  1359     History   Chief Complaint Chief Complaint  Patient presents with  . Loss of Consciousness    HPI Kristine [name] is a 38 y.o. female.     HPI  38 year old female comes in a chief complaint of fainting. Patient's husband at bedside.  Patient has history of anemia and anxiety.  She is not on any medication for anxiety or depression.  She reports that she was in an argument with her 38-year-old who was not listening to her.  Patient started getting very upset and within few minutes started noticing that she was getting dizzy.  Subsequently she passed out in her garage.  Mother-in-law started CPR and called 911.  Next thing patient recalls the EMS team was helping her.  Patient states that before she fainted she was having tingling sensation all over her body along with heaviness type feeling and headache.  She does not have any history of syncope but reports that after she delivered a baby she went into coma, and she had full neurologic work-up including imaging and EEG which were negative.  She has had attacks of anxiety when she gets upset in the past, but it has never led to her passing out.  She denies any blood loss, history of PE, substance abuse.  Pt has no hx of PE, DVT and denies any exogenous hormone (testosterone / estrogen) use, long distance travels or surgery in the past 6 weeks, active cancer, recent immobilization.   Past Medical History:  Diagnosis Date  . Acute blood loss anemia 02/21/2013  . Anemia, iron deficiency 02/21/2013  . Cholestasis during pregnancy in second trimester 01/28/2018  . Constipation, chronic 12/12/2012  . Depression   . Previous cesarean delivery, antepartum condition or complication 11/22/2016  . Vitamin D deficiency, unspecified     Patient Active Problem List   Diagnosis Date Noted  . Syncope 03/28/2018  . PP C/S  (8/16) 03/28/2018  . Cholestasis during pregnancy in second trimester 01/28/2018  . MDD (major depressive disorder), recurrent episode, moderate (HCC) 10/30/2017  . Perinatal depression in first trimester 10/01/2017  . Previous cesarean delivery, antepartum condition or complication 11/22/2016  . Cesarean delivery delivered 11/22/2016  . Postpartum care following cesarean delivery (4/12) 11/22/2016  . Hemorrhoids, external, thrombosed s/p excision 12/12/2012 12/12/2012  . Constipation, chronic 12/12/2012    Past Surgical History:  Procedure Laterality Date  . CESAREAN SECTION N/A 02/20/2013   Procedure: CESAREAN SECTION;  Surgeon: Robley FriesVaishali R Mody, MD;  Location: [facility];  Service: Obstetrics;  Laterality: N/A;  . CESAREAN SECTION N/A 11/22/2016   Procedure: REPEAT CESAREAN SECTION;  Surgeon: Shea EvansVaishali Mody, MD;  [facility];  Service: Obstetrics;  Laterality: N/A;  EDD: 11/29/16  . CESAREAN SECTION N/A 03/28/2018   Procedure: Repeat CESAREAN SECTION;  Surgeon: Shea EvansMody, Vaishali, MD;  [facility];  Service: Obstetrics;  Laterality: N/A;  EDD: 04/16/18     OB History    Gravida  3   Para  3   Term  3   Preterm      AB      Living  3     SAB      TAB      Ectopic      Multiple  0   Live Births  3            Home Medications  Prior to Admission medications   Medication Sig Start Date End Date Taking? Authorizing Provider  acetaminophen (TYLENOL) 325 MG tablet Take 2 tablets (650 mg total) by mouth every 4 (four) hours as needed (for pain scale < 4). Patient not taking: Reported on 07/02/2018 03/30/18   Gunnar BullaLawhorn, Jenkins Michelle, CNM  azithromycin (ZITHROMAX) 250 MG tablet Take 1 tablet (250 mg total) by mouth daily. Take first 2 tablets together, then 1 every day until finished. 07/02/18   Khatri, Hina, PA-C  benzonatate (TESSALON) 100 MG capsule Take 1 capsule (100 mg total) by mouth every 8 (eight) hours. 07/02/18   Khatri, Hina, PA-C   HYDROMET 5-1.5 MG/5ML syrup Take 2.5-5 mLs by mouth every 8 (eight) hours as needed for cough or pain. 06/30/18   [provider]  ibuprofen (ADVIL,MOTRIN) 600 MG tablet Take 1 tablet (600 mg total) by mouth every 6 (six) hours. Patient taking differently: Take 600 mg by mouth every 6 (six) hours as needed for moderate pain.  03/30/18   Gunnar BullaLawhorn, Jenkins Michelle, CNM  iron polysaccharides (NIFEREX) 150 MG capsule Take 1 capsule (150 mg total) by mouth daily. Patient not taking: Reported on 07/02/2018 03/30/18   Gunnar BullaLawhorn, Jenkins Michelle, CNM  magnesium oxide (MAG-OX) 400 (241.3 Mg) MG tablet Take 1 tablet (400 mg total) by mouth daily. Patient not taking: Reported on 07/02/2018 03/30/18   Gunnar BullaLawhorn, Jenkins Michelle, CNM  ranitidine (ZANTAC) 150 MG tablet Take 1 tablet by mouth 2 (two) times daily. 06/30/18   [provider]    Family History Family History  Problem Relation Age of Onset  . Hypertension Mother   . Cancer Mother        uterus  . Hypertension Paternal Uncle   . Hypothyroidism Sister   . Other Neg Hx   . Hearing loss Neg Hx   . Alcohol abuse Neg Hx   . Arthritis Neg Hx   . Asthma Neg Hx   . Birth defects Neg Hx   . COPD Neg Hx   . Depression Neg Hx   . Diabetes Neg Hx   . Drug abuse Neg Hx   . Early death Neg Hx   . Heart disease Neg Hx   . Hyperlipidemia Neg Hx   . Kidney disease Neg Hx   . Learning disabilities Neg Hx   . Mental illness Neg Hx   . Mental retardation Neg Hx   . Miscarriages / Stillbirths Neg Hx   . Stroke Neg Hx   . Vision loss Neg Hx   . Seizures Neg Hx     Social History Social History   Tobacco Use  . Smoking status: Never Smoker  . Smokeless tobacco: Never Used  Substance Use Topics  . Alcohol use: No  . Drug use: No     Allergies   Patient has no known allergies.   Review of Systems Review of Systems  Constitutional: Positive for activity change.  Respiratory: Negative for shortness of breath.    Cardiovascular: Negative for chest pain.  Neurological: Positive for syncope.  All other systems reviewed and are negative.    Physical Exam Updated Vital Signs BP 93/67   Pulse 66   Temp 98 F (36.7 C) (Oral)   Resp (!) 25   SpO2 98%   Physical Exam Vitals signs and nursing note reviewed.  Constitutional:      Appearance: She is well-developed.  HENT:     Head: Normocephalic and atraumatic.  Eyes:     Pupils:  Pupils are equal, round, and reactive to light.  Neck:     Musculoskeletal: Neck supple.  Cardiovascular:     Rate and Rhythm: Normal rate and regular rhythm.     Heart sounds: Normal heart sounds. No murmur.  Pulmonary:     Effort: Pulmonary effort is normal. No respiratory distress.  Abdominal:     General: There is no distension.     Palpations: Abdomen is soft.     Tenderness: There is no abdominal tenderness. There is no guarding or rebound.  Skin:    General: Skin is warm and dry.  Neurological:     Mental Status: She is alert and oriented to person, place, and time.      ED Treatments / Results  Labs (all labs ordered are listed, but only abnormal results are displayed) Labs Reviewed  URINALYSIS, ROUTINE W REFLEX MICROSCOPIC - Abnormal; Notable for the following components:      Result Value   Hgb urine dipstick MODERATE (*)    All other components within normal limits  BASIC METABOLIC PANEL  CBC  CBG MONITORING, ED  CBG MONITORING, ED  I-STAT BETA HCG BLOOD, ED ([facility], [facility], [facility] ONLY)    EKG EKG Interpretation  Date/Time:  Wednesday March 11 2019 14:12:57 EDT Ventricular Rate:  78 PR Interval:  152 QRS Duration: 74 QT Interval:  370 QTC Calculation: 421 R Axis:   46 Text Interpretation:  Normal sinus rhythm Normal ECG No acute changes No significant change since last tracing Confirmed by Varney Biles [phone]) on 03/11/2019 6:33:28 PM   Radiology Ct Head Wo Contrast  Result Date: 03/11/2019 CLINICAL DATA:  Headache, syncope, found  unresponsive EXAM: CT HEAD WITHOUT CONTRAST CT CERVICAL SPINE WITHOUT CONTRAST TECHNIQUE: Multidetector CT imaging of the head and cervical spine was performed following the standard protocol without intravenous contrast. Multiplanar CT image reconstructions of the cervical spine were also generated. COMPARISON:  03/27/2018 FINDINGS: CT HEAD FINDINGS Brain: No evidence of acute infarction, hemorrhage, hydrocephalus, extra-axial collection or mass lesion/mass effect. Vascular: No hyperdense vessel or unexpected calcification. Skull: Normal. Negative for fracture or focal lesion. Sinuses/Orbits: No acute finding. Other: None. CT CERVICAL SPINE FINDINGS Alignment: Normal. Skull base and vertebrae: No acute fracture. No primary bone lesion or focal pathologic process. Soft tissues and spinal canal: No prevertebral fluid or swelling. No visible canal hematoma. Disc levels:  Intact. Upper chest: Negative. Other: None. IMPRESSION: 1.  No acute intracranial pathology. 2.  No fracture or static subluxation of the cervical spine. Electronically Signed   By: Eddie Candle M.D.   On: 03/11/2019 19:10   Ct Cervical Spine Wo Contrast  Result Date: 03/11/2019 CLINICAL DATA:  Headache, syncope, found unresponsive EXAM: CT HEAD WITHOUT CONTRAST CT CERVICAL SPINE WITHOUT CONTRAST TECHNIQUE: Multidetector CT imaging of the head and cervical spine was performed following the standard protocol without intravenous contrast. Multiplanar CT image reconstructions of the cervical spine were also generated. COMPARISON:  03/27/2018 FINDINGS: CT HEAD FINDINGS Brain: No evidence of acute infarction, hemorrhage, hydrocephalus, extra-axial collection or mass lesion/mass effect. Vascular: No hyperdense vessel or unexpected calcification. Skull: Normal. Negative for fracture or focal lesion. Sinuses/Orbits: No acute finding. Other: None. CT CERVICAL SPINE FINDINGS Alignment: Normal. Skull base and vertebrae: No acute fracture. No primary bone  lesion or focal pathologic process. Soft tissues and spinal canal: No prevertebral fluid or swelling. No visible canal hematoma. Disc levels:  Intact. Upper chest: Negative. Other: None. IMPRESSION: 1.  No acute intracranial pathology. 2.  No  fracture or static subluxation of the cervical spine. Electronically Signed   By: Lauralyn PrimesAlex  Bibbey M.D.   On: 03/11/2019 19:10    Procedures Procedures (including critical care time)  Medications Ordered in ED Medications  ketorolac (TORADOL) 15 MG/ML injection 15 mg (has no administration in time range)  LORazepam (ATIVAN) tablet 1 mg (has no administration in time range)  sodium chloride flush (NS) 0.9 % injection 3 mL (3 mLs Intravenous Given 03/11/19 1740)  metoCLOPramide (REGLAN) injection 10 mg (10 mg Intravenous Given 03/11/19 1739)  sodium chloride 0.9 % bolus 500 mL (500 mLs Intravenous New Bag/Given 03/11/19 1739)  acetaminophen (TYLENOL) tablet 650 mg (650 mg Oral Given 03/11/19 1735)     Initial Impression / Assessment and Plan / ED Course  I have reviewed the triage vital signs and the nursing notes.  Pertinent labs & imaging results that were available during my care of the patient were reviewed by me and considered in my medical decision making (see chart for details).        DDx includes: Orthostatic hypotension Stroke Vertebral artery dissection/stenosis Dysrhythmia PE Vasovagal/neurocardiogenic syncope Anemia   Patient comes in after she had a fainting spell.  It appears that the event was evoked by stress.  She has history of anxiety.  She was in a high stress argument, dispute with her son before she started having a prodrome of dizziness, entire body heaviness, tingling sensation and headache.  Allegedly there was CPR done on scene.  Patient is not having any chest pain.  She is not hypoxic and lungs are clear with O2 sats 99%.  She is having headache and neck pain.  I will get a CT head and C-spine is she was found on the garage  floor and might have struck her head.   Final Clinical Impressions(s) / ED Diagnoses   Final diagnoses:  Syncope and collapse  Panic attack as reaction to stress    ED Discharge Orders    None       Derwood KaplanNanavati, Cayne Yom, MD 03/11/19 1946

## 2019-03-11 NOTE — ED Triage Notes (Signed)
Pt arrived by [facility]. Pt reports having an argument and then onset of tingling to bilateral arms and hands. Pt had syncopal episode and found unresponsive by family. Family then began chest compressions for very short period of time. Pt awake, a&ox4 on arrival and no complaints other than tingling to her arms/hands.

## 2019-10-17 ENCOUNTER — Encounter (HOSPITAL_COMMUNITY): Payer: Self-pay | Admitting: *Deleted

## 2019-10-17 ENCOUNTER — Emergency Department (HOSPITAL_COMMUNITY)
Admission: EM | Admit: 2019-10-17 | Discharge: 2019-10-17 | Disposition: A | Discharge status: Home / Self Care | Attending: Emergency Medicine | Admitting: Emergency Medicine

## 2019-10-17 ENCOUNTER — Other Ambulatory Visit: Payer: Self-pay

## 2019-10-17 DIAGNOSIS — M545 Low back pain, unspecified: Secondary | ICD-10-CM

## 2019-10-17 MED ORDER — LIDOCAINE 5 % EX PTCH
2.0000 | MEDICATED_PATCH | CUTANEOUS | Status: DC
  Administered 2019-10-17: 2 via TRANSDERMAL
  Filled 2019-10-17: qty 2

## 2019-10-17 MED ORDER — KETOROLAC TROMETHAMINE 30 MG/ML IJ SOLN
30.0000 mg | Freq: Once | INTRAMUSCULAR | Status: AC
  Administered 2019-10-17: 30 mg via INTRAMUSCULAR
  Filled 2019-10-17: qty 1

## 2019-10-17 MED ORDER — METHOCARBAMOL 500 MG PO TABS: 500.0000 mg | ORAL_TABLET | Freq: Two times a day (BID) | ORAL | 0 refills | Status: DC

## 2019-10-17 MED ORDER — NAPROXEN 500 MG PO TABS: 500.0000 mg | ORAL_TABLET | Freq: Two times a day (BID) | ORAL | 0 refills | Status: DC

## 2019-10-17 NOTE — ED Provider Notes (Signed)
[facility] EMERGENCY DEPARTMENT Provider Note   CSN: [id number] Arrival date & time: 10/17/19  1919     History Chief Complaint  Patient presents with  . Back Pain    Kristine [name] is a 39 y.o. female with a past medical history significant for depression and vitamin D deficiency who presents to the ED due to gradual onset of worsening low back pain x2 days.  Patient admits to chronic low back pain which has worsened over the past few days.  Patient describes the pain as a shooting sensation with any movement.  Pain starts in the middle of her low back and radiates out to her sides.  Pain is worse with bending over and ambulation.  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  Denies direct injury to low back.  Patient denies abdominal pain and urinary symptoms.  Denies IV drug use, history of cancer, saddle paresthesias, fever/chills, bowel/bladder incontinence, and lower extremity weakness.  She has tried ibuprofen with no relief.  Pain is relieved when sitting still.      Past Medical History:  Diagnosis Date  . Acute blood loss anemia 02/21/2013  . Anemia, iron deficiency 02/21/2013  . Cholestasis during pregnancy in second trimester 01/28/2018  . Constipation, chronic 12/12/2012  . Depression   . Previous cesarean delivery, antepartum condition or complication 4/96/7591  . Vitamin D deficiency, unspecified     Patient Active Problem List   Diagnosis Date Noted  . Syncope 03/28/2018  . PP C/S (8/16) 03/28/2018  . Cholestasis during pregnancy in second trimester 01/28/2018  . MDD (major depressive disorder), recurrent episode, moderate ([place]) 10/30/2017  . Perinatal depression in first trimester 10/01/2017  . Previous cesarean delivery, antepartum condition or complication 63/84/6659  . Cesarean delivery delivered 11/22/2016  . Postpartum care following cesarean delivery (4/12) 11/22/2016  . Hemorrhoids, external,  thrombosed s/p excision 12/12/2012 12/12/2012  . Constipation, chronic 12/12/2012    Past Surgical History:  Procedure Laterality Date  . CESAREAN SECTION N/A 02/20/2013   Procedure: CESAREAN SECTION;  Surgeon: Elveria Royals, MD;  Location: [facility];  Service: Obstetrics;  Laterality: N/A;  . CESAREAN SECTION N/A 11/22/2016   Procedure: REPEAT CESAREAN SECTION;  Surgeon: Azucena Fallen, MD;  Location: [facility];  Service: Obstetrics;  Laterality: N/A;  EDD: 11/29/16  . CESAREAN SECTION N/A 03/28/2018   Procedure: Repeat CESAREAN SECTION;  Surgeon: Azucena Fallen, MD;  Location: [facility];  Service: Obstetrics;  Laterality: N/A;  EDD: 04/16/18     OB History    Gravida  3   Para  3   Term  3   Preterm      AB      Living  3     SAB      TAB      Ectopic      Multiple  0   Live Births  3           Family History  Problem Relation Age of Onset  . Hypertension Mother   . Cancer Mother        uterus  . Hypertension Paternal Uncle   . Hypothyroidism Sister   . Other Neg Hx   . Hearing loss Neg Hx   . Alcohol abuse Neg Hx   . Arthritis Neg Hx   . Asthma Neg Hx   . Birth defects Neg Hx   . COPD Neg Hx   . Depression Neg  Hx   . Diabetes Neg Hx   . Drug abuse Neg Hx   . Early death Neg Hx   . Heart disease Neg Hx   . Hyperlipidemia Neg Hx   . Kidney disease Neg Hx   . Learning disabilities Neg Hx   . Mental illness Neg Hx   . Mental retardation Neg Hx   . Miscarriages / Stillbirths Neg Hx   . Stroke Neg Hx   . Vision loss Neg Hx   . Seizures Neg Hx     Social History   Tobacco Use  . Smoking status: Never Smoker  . Smokeless tobacco: Never Used  Substance Use Topics  . Alcohol use: No  . Drug use: No    Home Medications Prior to Admission medications   Medication Sig Start Date End Date Taking? Authorizing Provider  acetaminophen (TYLENOL) 325 MG tablet Take 2 tablets (650 mg total) by mouth every 4 (four) hours as needed (for pain  scale < 4). Patient not taking: Reported on 07/02/2018 03/30/18   Gunnar Bulla, CNM  azithromycin (ZITHROMAX) 250 MG tablet Take 1 tablet (250 mg total) by mouth daily. Take first 2 tablets together, then 1 every day until finished. 07/02/18   Khatri, Hina, PA-C  benzonatate (TESSALON) 100 MG capsule Take 1 capsule (100 mg total) by mouth every 8 (eight) hours. 07/02/18   Khatri, Hina, PA-C  HYDROMET 5-1.5 MG/5ML syrup Take 2.5-5 mLs by mouth every 8 (eight) hours as needed for cough or pain. 06/30/18   [provider]  ibuprofen (ADVIL,MOTRIN) 600 MG tablet Take 1 tablet (600 mg total) by mouth every 6 (six) hours. Patient taking differently: Take 600 mg by mouth every 6 (six) hours as needed for moderate pain.  03/30/18   Gunnar Bulla, CNM  iron polysaccharides (NIFEREX) 150 MG capsule Take 1 capsule (150 mg total) by mouth daily. Patient not taking: Reported on 07/02/2018 03/30/18   Gunnar Bulla, CNM  magnesium oxide (MAG-OX) 400 (241.3 Mg) MG tablet Take 1 tablet (400 mg total) by mouth daily. Patient not taking: Reported on 07/02/2018 03/30/18   Gunnar Bulla, CNM  methocarbamol (ROBAXIN) 500 MG tablet Take 1 tablet (500 mg total) by mouth 2 (two) times daily. 10/17/19   Mannie Stabile, PA-C  naproxen (NAPROSYN) 500 MG tablet Take 1 tablet (500 mg total) by mouth 2 (two) times daily. 10/17/19   Mannie Stabile, PA-C  ranitidine (ZANTAC) 150 MG tablet Take 1 tablet by mouth 2 (two) times daily. 06/30/18   [provider]    Allergies    Patient has no known allergies.  Review of Systems   Review of Systems  Constitutional: Negative for chills and fever.  Gastrointestinal: Negative for abdominal pain.  Genitourinary: Negative for dysuria.  Musculoskeletal: Positive for back pain and gait problem.  Neurological: Negative for numbness.    Physical Exam Updated Vital Signs BP 121/85 (BP Location: Right Arm)   Pulse  71   Temp 98.2 F (36.8 C) (Oral)   Resp 16   SpO2 100%   Physical Exam Vitals and nursing note reviewed.  Constitutional:      General: She is not in acute distress.    Appearance: She is not ill-appearing.  HENT:     Head: Normocephalic.  Eyes:     Conjunctiva/sclera: Conjunctivae normal.  Cardiovascular:     Rate and Rhythm: Normal rate and regular rhythm.     Pulses: Normal pulses.  Heart sounds: Normal heart sounds. No murmur. No friction rub. No gallop.   Pulmonary:     Effort: Pulmonary effort is normal.     Breath sounds: Normal breath sounds.  Abdominal:     General: Abdomen is flat. There is no distension.     Palpations: Abdomen is soft.     Tenderness: There is no abdominal tenderness. There is no right CVA tenderness, left CVA tenderness, guarding or rebound.  Musculoskeletal:     Cervical back: Neck supple.     Comments: No T-spine and L-spine midline tenderness, no stepoff or deformity, no paraspinal tenderness. No leg edema bilaterally Patient moves all extremities without difficulty. DP/PT pulses 2+ and equal bilaterally Sensation grossly intact bilaterally Strength of knee flexion and extension is 5/5 Plantar and dorsiflexion of ankle 5/5 Able to ambulate without difficulty   Skin:    General: Skin is warm and dry.  Neurological:     General: No focal deficit present.     Mental Status: She is alert.  Psychiatric:        Mood and Affect: Mood normal.        Behavior: Behavior normal.     ED Results / Procedures / Treatments   Labs (all labs ordered are listed, but only abnormal results are displayed) Labs Reviewed - No data to display  EKG None  Radiology No results found.  Procedures Procedures (including critical care time)  Medications Ordered in ED Medications  lidocaine (LIDODERM) 5 % 2 patch (2 patches Transdermal Patch Applied 10/17/19 2140)  ketorolac (TORADOL) 30 MG/ML injection 30 mg (30 mg Intramuscular Given 10/17/19 2140)     ED Course  I have reviewed the triage vital signs and the nursing notes.  Pertinent labs & imaging results that were available during my care of the patient were reviewed by me and considered in my medical decision making (see chart for details).    MDM Rules/Calculators/A&P                     39 year old female presents to the ED due to acute on chronic low back pain that is worsened over the past 2 days.  She is afebrile, not tachycardic or hypoxic.  Patient in no acute distress and non-ill-appearing.  No reproducible paraspinal tenderness.  No thoracic or lumbar midline tenderness. Broad differential for back pain considered includes malignancy, disc herniation, spinal epidural abscess, spinal fracture, cauda equina, pyelonephritis, kidney stone, AAA, AD, pancreatitis, PE and PTX.  History without red flags (cancer, IVDU, weakness, saddle anesthesia, trauma, weight loss) and physical exam most consistent with muscular strain. Doubt cauda equina or disc herniation due to lack of saddle anesthesia/bowel or bladder incontinence or urinary retention, normal gait and reassuring physical examination without neurologic deficits.   History is not supportive of kidney stone, AAA, AD, pancreatitis, PE or PTX. Patient has no CVA tenderness or urinary symptoms to suggest pyelonephritis or kidney stone.   Will manage patient conservatively at this time. NSAIDs, back exercises/stretches, heat therapy and follow up with PCP if symptoms do not resolve in 3-4 weeks. Patient offered muscle relaxer for comfort at night. Counseled on need to return to ED for fever, worsening or concerning symptoms. Patient agreeable to plan and states understanding of follow up plans and return precautions.   Discussed case with Dr. Jodi Mourning who evaluated patient at bedside and agrees with assessment and plan.  Final Clinical Impression(s) / ED Diagnoses Final diagnoses:  Acute bilateral low back  pain without sciatica    Rx /  DC Orders ED Discharge Orders         Ordered    naproxen (NAPROSYN) 500 MG tablet  2 times daily     10/17/19 2239    methocarbamol (ROBAXIN) 500 MG tablet  2 times daily     10/17/19 2239           Jesusita Oka 10/17/19 2247    Blane Ohara, MD 10/19/19 [phone]

## 2019-10-17 NOTE — Discharge Instructions (Signed)
As discussed, your back pain is most likely muscular in nature. I am sending you home with a pain medication called naproxen. You can take twice a day as needed for pain. I am also sending you home with a muscle relaxer. Medication can cause drowsiness, so do not drive or operate machinery while on the medication. I have included the number of the orthopedic doctor. Call within the next week to schedule an appointment if symptoms do not improve. Return to the ER for new or worsening symptoms. You may also purchase over the counter lidoderm patches and voltaren gel as needed for pain.

## 2019-10-17 NOTE — ED Triage Notes (Signed)
Pt arrives ambulatory to triage with c/o pain in the lower back area, in the middle of her back. Pain worse when she bends over.

## 2019-11-04 IMAGING — CT CT HEAD CODE STROKE
4 series · 17 of 47 positions shown, 19 images · non-contrast
Comparison: None.

CLINICAL DATA: Code stroke. Postop right facial droop with
left-sided gaze.

EXAM:
CT HEAD WITHOUT CONTRAST
TECHNIQUE: Contiguous axial images were obtained from the base of the skull
through the vertex without intravenous contrast.

[Series 3: head without · axial · non-contrast · 0.41mm/px · z∈[-131,-6]mm · 7 of 35 slices shown, 9 images]
[im 5/35  brain]
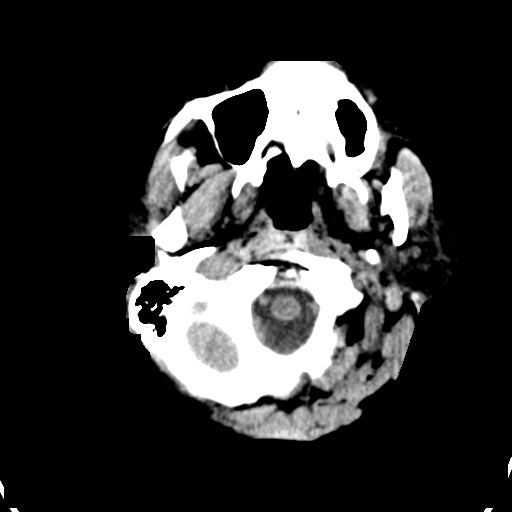
[im 5/35  bone]
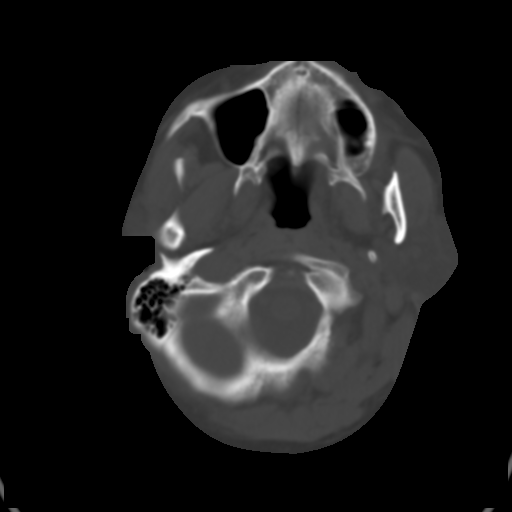
[im 9/35  brain]
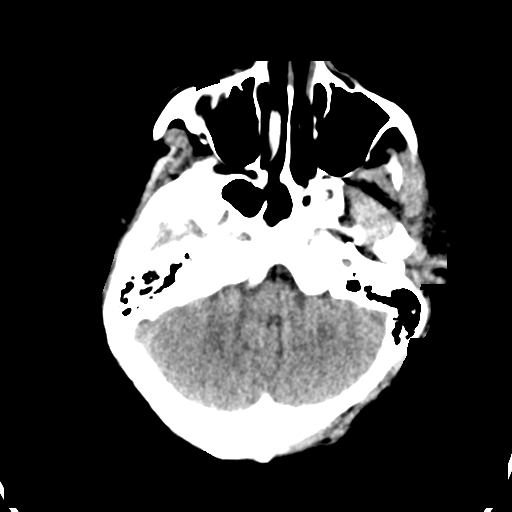
[im 13/35  brain]
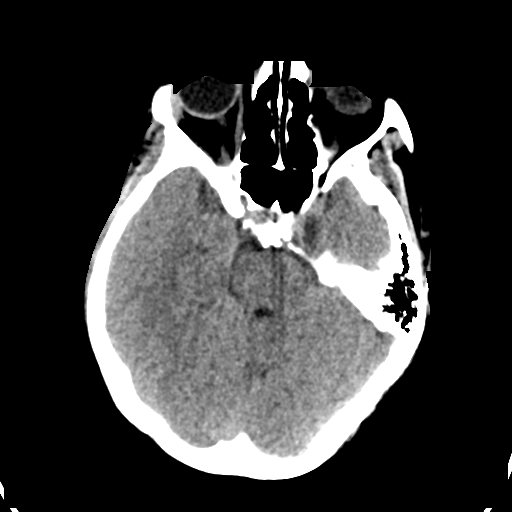
[im 18/35  brain]
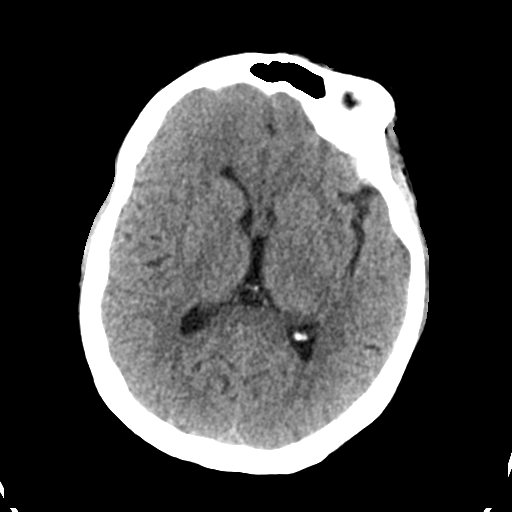
[im 22/35  brain]
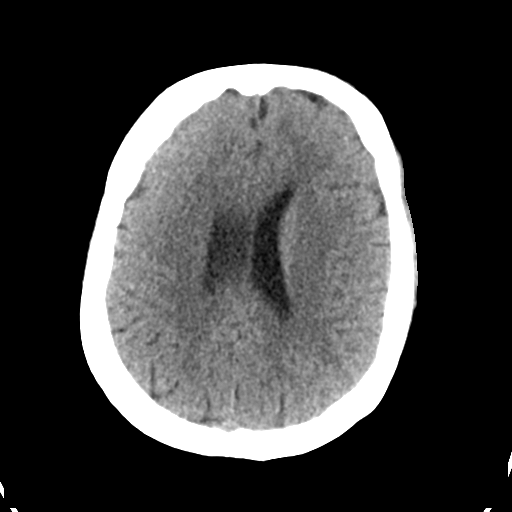
[im 22/35  bone]
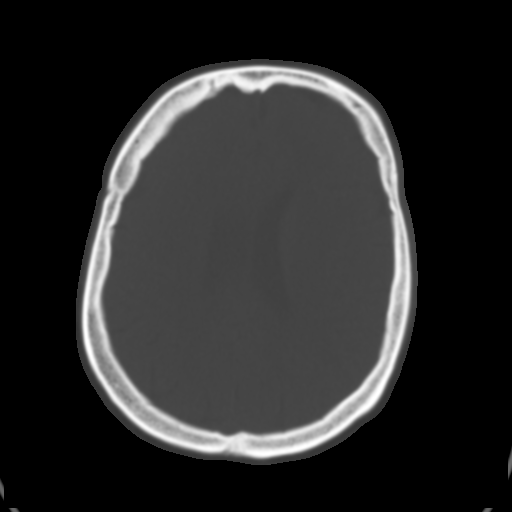
[im 26/35  brain]
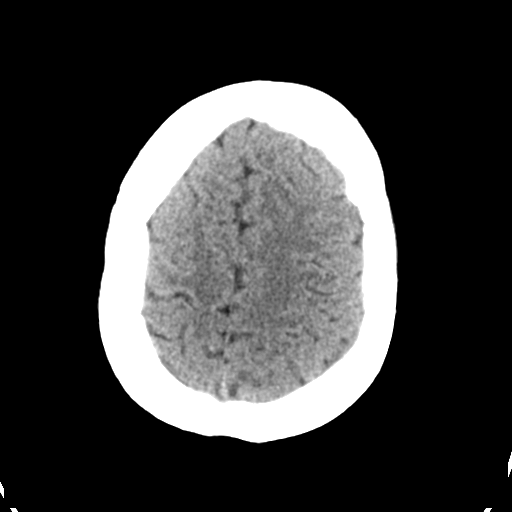
[im 30/35  brain]
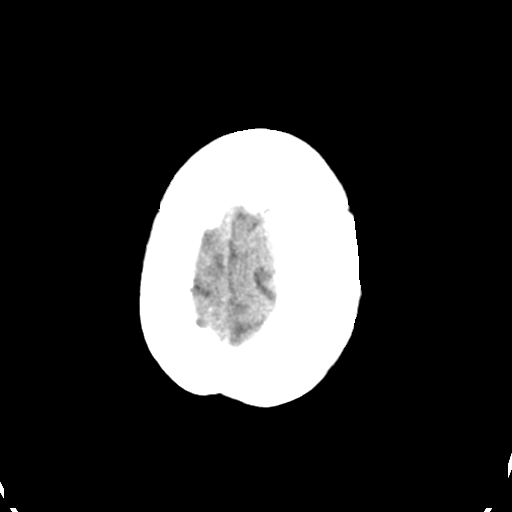

[Series 4: head bone · axial · 0.41mm/px · z∈[-135,-75]mm · 4 of 86 slices shown]
[im 9/86  bone]
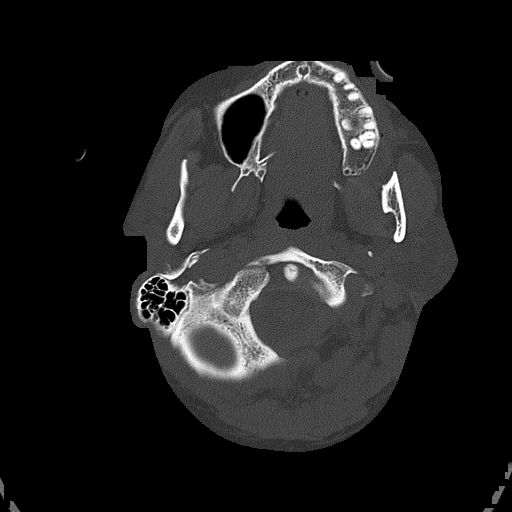
[im 18/86  bone]
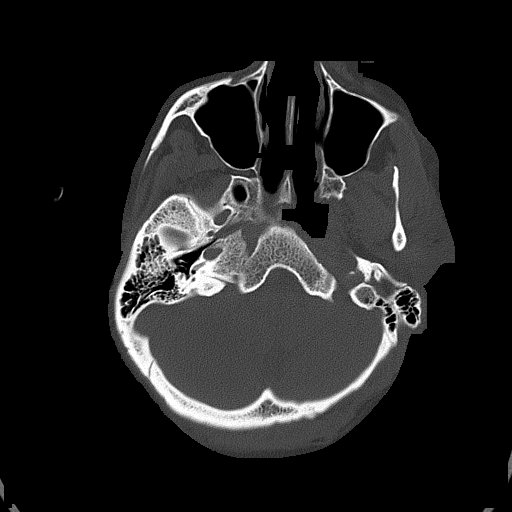
[im 26/86  bone]
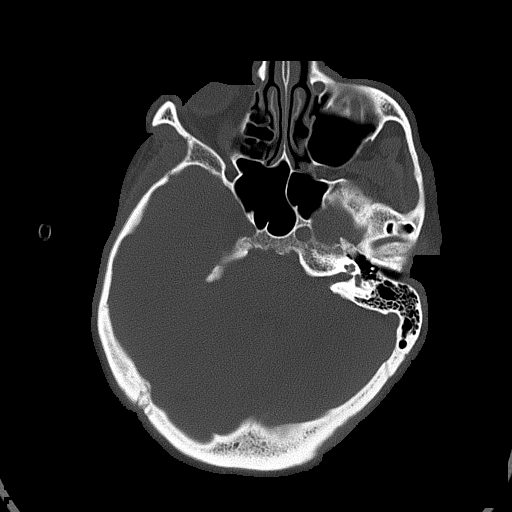
[im 39/86  bone]
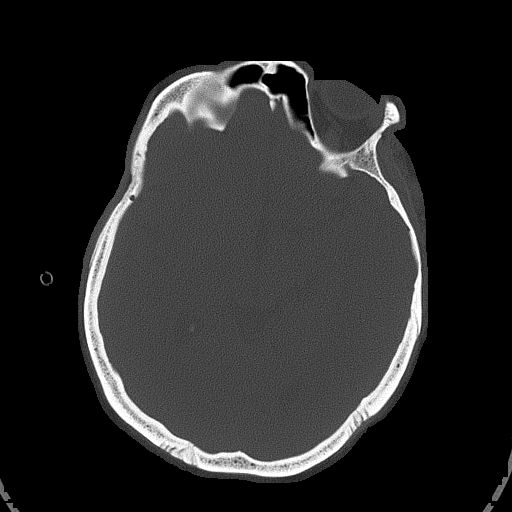

[Series 5: head without cor · coronal · non-contrast · 0.33mm/px · 3 of 67 slices shown]
[im 23/67  brain]
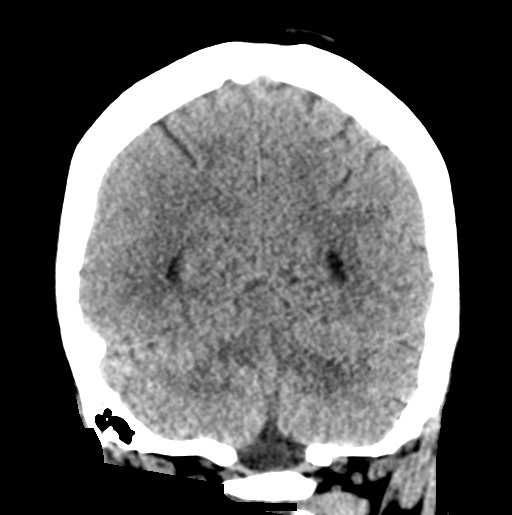
[im 30/67  brain]
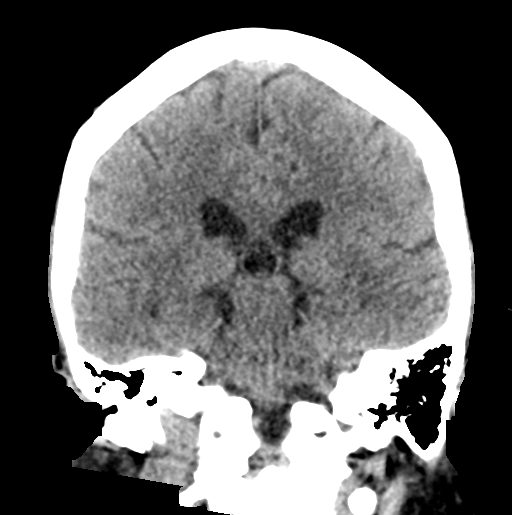
[im 37/67  brain]
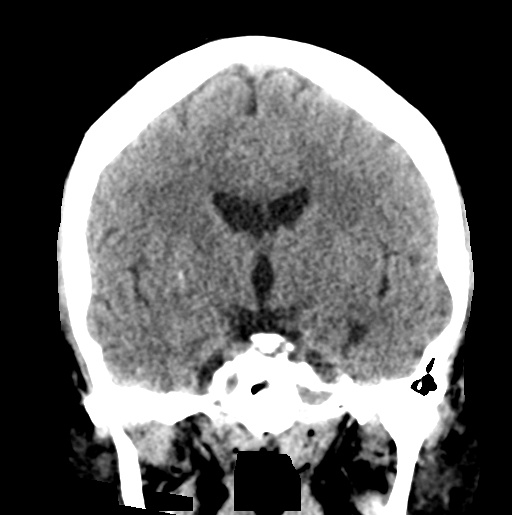

[Series 6: head without sag · sagittal · non-contrast · 0.33mm/px · 3 of 56 slices shown]
[im 19/56  brain]
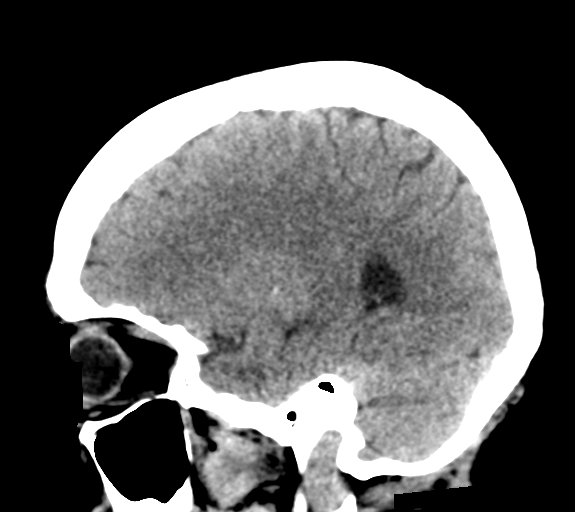
[im 27/56  brain]
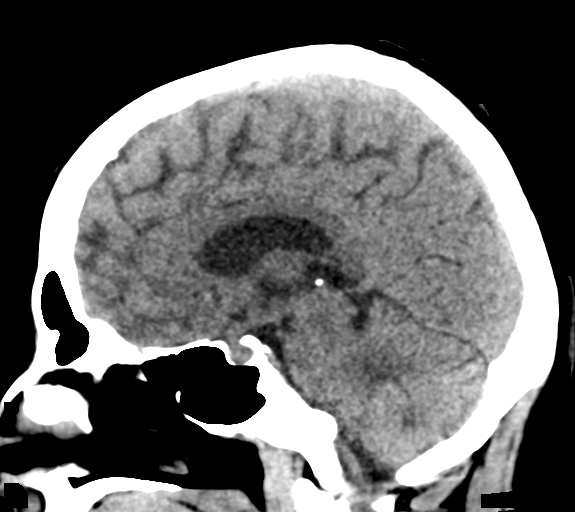
[im 35/56  brain]
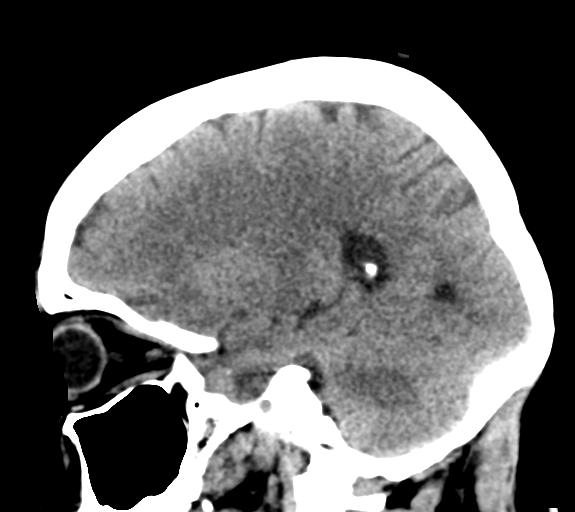

[17 of 47 positions shown; findings below may reference images not displayed]

FINDINGS: Brain: No evidence of acute infarction, hemorrhage, hydrocephalus,
extra-axial collection or mass lesion/mass effect.

Vascular: No hyperdense vessel or unexpected calcification.

Skull: Normal. Negative for fracture or focal lesion.

Sinuses/Orbits: No acute finding.

Other: These results were communicated to Dr. Montoya Huaman at [DATE] [place]
03/28/2018by text page via the AMION messaging system.

ASPECTS (Alberta Stroke Program Early CT Score)

- Ganglionic level infarction (caudate, lentiform nuclei, internal
capsule, insula, M1-M3 cortex): 7

- Supraganglionic infarction (M4-M6 cortex): 3

Total score (0-10 with 10 being normal): 10
IMPRESSION: Negative head CT.  ASPECTS is 10.

## 2019-11-04 IMAGING — CT CT ANGIO HEAD
3 of 7 series · 10 of 36 positions shown · IV contrast (iopamidol)
Comparison: Head CT without contrast 7228 hours today.

CLINICAL DATA: 37-year-old female code stroke. Right facial droop,
altered mental status status post C-section.

EXAM:
CT ANGIOGRAPHY HEAD AND NECK
TECHNIQUE: Multidetector CT imaging of the head and neck was performed using
the standard protocol during bolus administration of intravenous
contrast. Multiplanar CT image reconstructions and MIPs were
obtained to evaluate the vascular anatomy. Carotid stenosis
measurements (when applicable) are obtained utilizing NASCET
criteria, using the distal internal carotid diameter as the
denominator.
CONTRAST:  50mL WGZU2B-GFM IOPAMIDOL (WGZU2B-GFM) INJECTION 76%

[Series 5: cta neck · axial · 0.39mm/px · z∈[-206,-94]mm · 2 of 170 slices shown]
[im 57/170  soft-tissue]
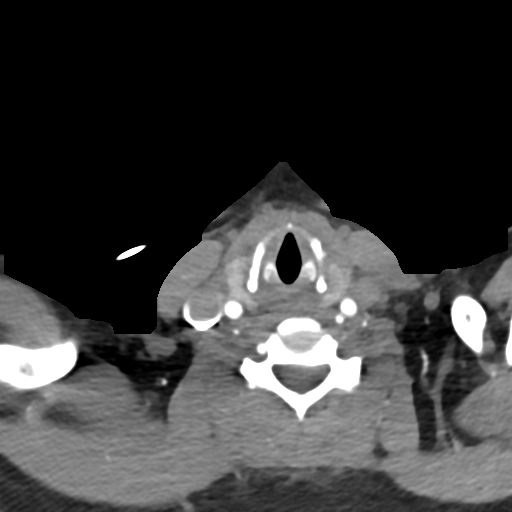
[im 113/170  soft-tissue]
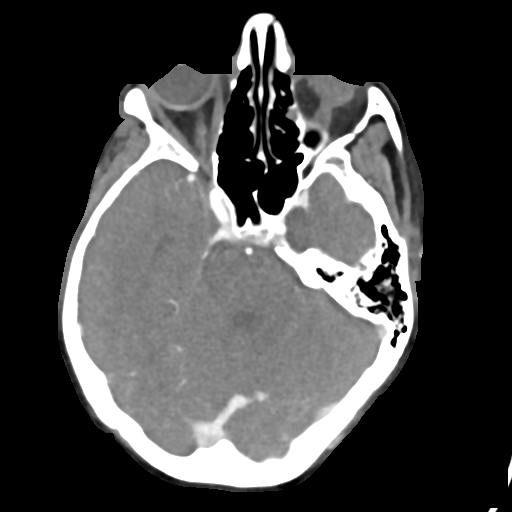

[Series 7: cta neck axial · axial · 0.39mm/px · z∈[-276,-37]mm · 6 of 338 slices shown]
[im 49/338  soft-tissue]
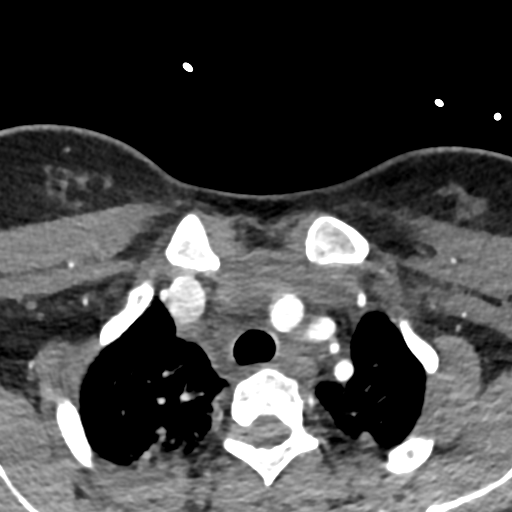
[im 97/338  bone]
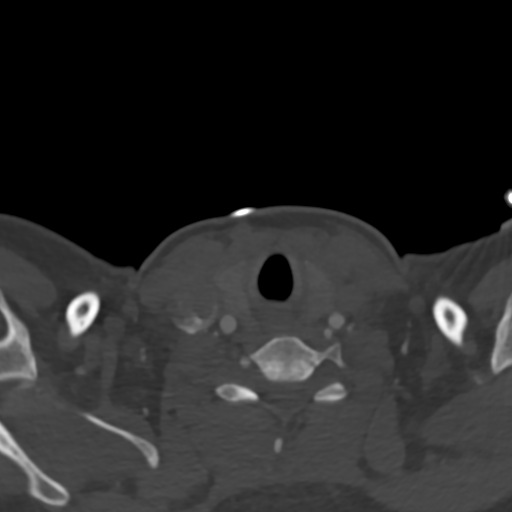
[im 145/338  soft-tissue]
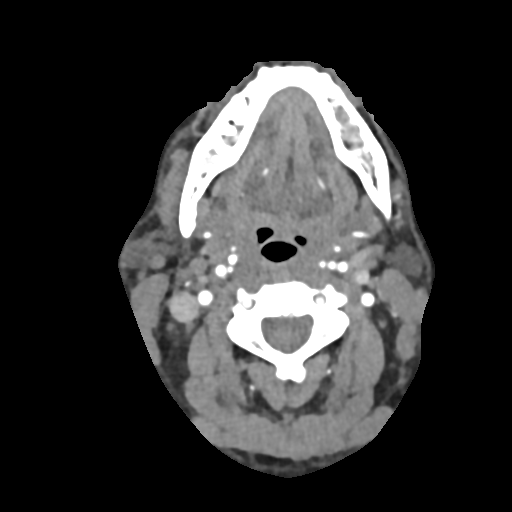
[im 193/338  bone]
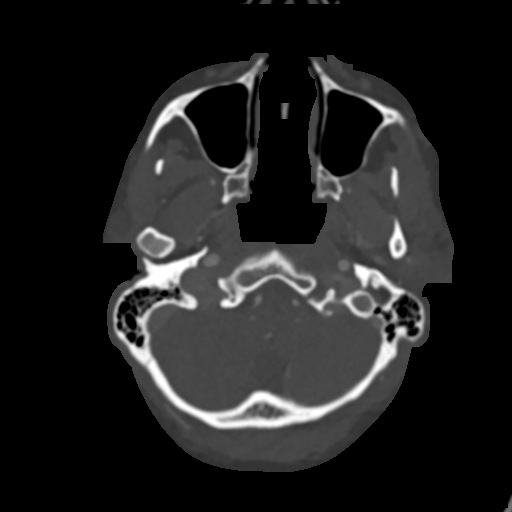
[im 241/338  soft-tissue]
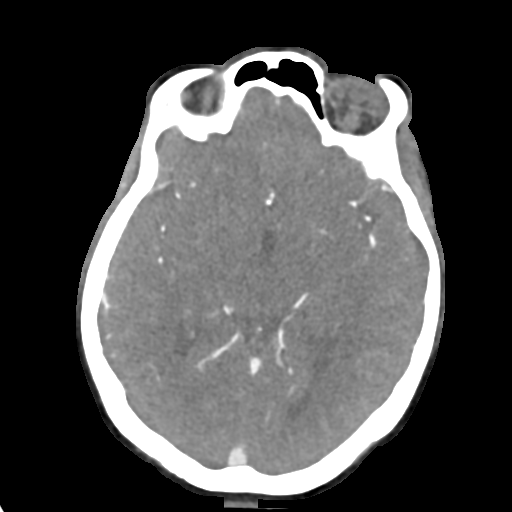
[im 289/338  bone]
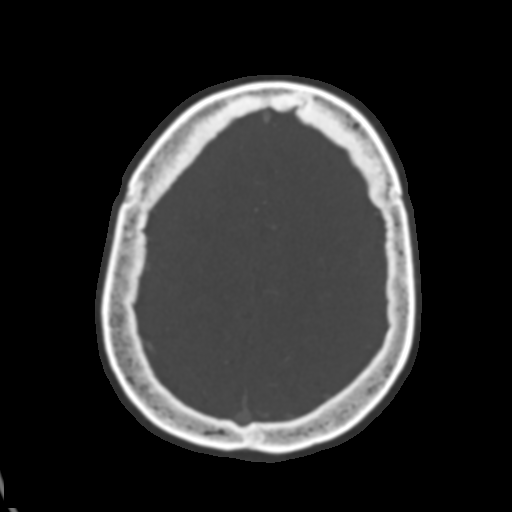

[Series 9: cta neck sagittal · sagittal · 0.44mm/px · 2 of 189 slices shown]
[im 54/189  soft-tissue]
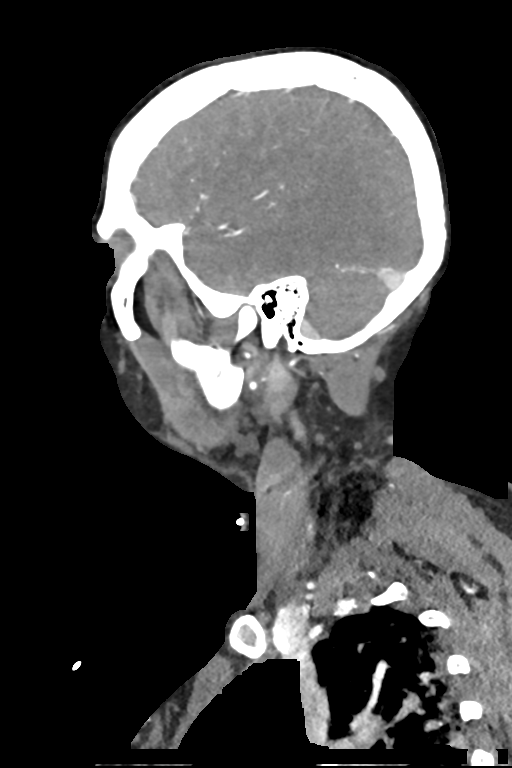
[im 136/189  soft-tissue]
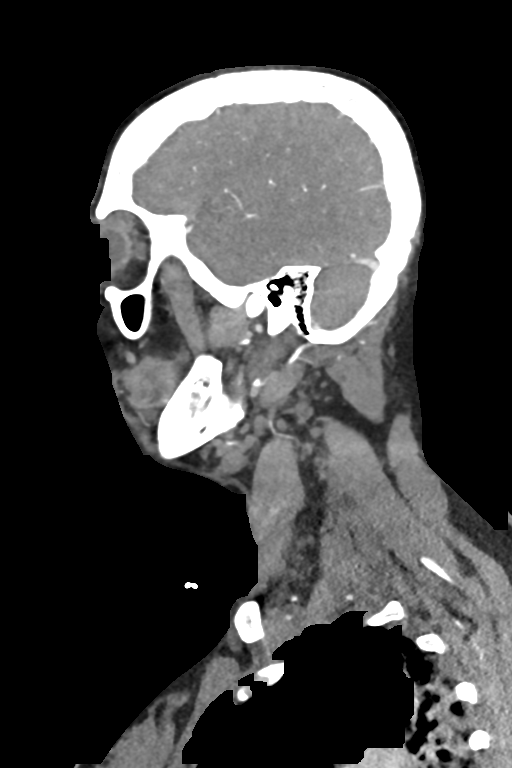

[10 of 36 positions shown; findings below may reference images not displayed]

FINDINGS: CTA NECK

Skeleton: No acute osseous abnormality identified. Paranasal sinuses
and mastoids are stable and well pneumatized.

Upper chest: Confluent dependent bilateral upper lung opacity with
superimposed diffuse pulmonary septal thickening. The visible Major
airways are patent. No pleural effusion. No superior mediastinal
lymphadenopathy.

Other neck: Negative.  No neck mass or lymphadenopathy.

Aortic arch: 4 vessel arch configuration, the left vertebral artery
arises directly from the arch. No arch or great vessel origin
atherosclerosis.

Right carotid system: Negative.

Left carotid system: Negative.

Vertebral arteries:
No proximal right subclavian artery or right vertebral artery origin
plaque or stenosis. The right vertebral artery appears mildly
dominant and is patent, normal to the skull base.

The left vertebral artery arises directly from the arch. The origin
and left V1 segment are patent and normal. The vessel has a late
entry into the cervical transverse foramen and is mildly non
dominant. The left vertebral artery is patent to the skull base
without abnormality.

CTA HEAD

Posterior circulation: Mildly dominant right vertebral artery.
Normal V4 segments, PICA origins, and vertebrobasilar junction.
Patent basilar artery without stenosis. Patent AICA, SCA and PCA
origins. The left posterior communicating artery is present,
diminutive. The right is smaller and/or absent. Bilateral PCA
branches are within normal limits.

Anterior circulation: Both ICA siphons are patent with no
atherosclerosis or stenosis. Normal ophthalmic and left posterior
communicating artery origins. Patent carotid termini. Normal MCA and
ACA origins.

The distal right A1 segment is fenestrated, normal variant. The left
A1 and anterior communicating arteries are within normal limits. The
right A2 segment is mildly dominant. Bilateral ACA branches are
within normal limits. Right MCA M1 segment, bifurcation, and right
MCA branches are within normal limits.

Left MCA M1 segment, bifurcation, and left MCA branches are patent
and appear normal.

Venous sinuses: Patent.

Anatomic variants:

Left vertebral artery arises directly from the arch.

Mildly dominant right vertebral artery.

Fenestrated right ACA A1 segment.

Review of the MIP images confirms the above findings
IMPRESSION: 1. Negative CTA for large vessel occlusion, atherosclerosis, or
arterial abnormality in the head and neck.
2. Abnormal lungs with dependent opacity and septal thickening which
may reflect combination of pulmonary edema, aspiration and/or
atelectasis.
3. These results were communicated to Dr. Databex at [DATE] [place]
03/28/2018by text page via the AMION messaging system.

## 2021-01-01 ENCOUNTER — Emergency Department (HOSPITAL_COMMUNITY)
Admission: EM | Admit: 2021-01-01 | Discharge: 2021-01-02 | Disposition: A | Discharge status: Other Acute Inpatient Hospital | Attending: Emergency Medicine | Admitting: Emergency Medicine

## 2021-01-01 DIAGNOSIS — F332 Major depressive disorder, recurrent severe without psychotic features: Secondary | ICD-10-CM | POA: Insufficient documentation

## 2021-01-01 DIAGNOSIS — Y900 Blood alcohol level of less than 20 mg/100 ml: Secondary | ICD-10-CM | POA: Diagnosis not present

## 2021-01-01 DIAGNOSIS — T43221A Poisoning by selective serotonin reuptake inhibitors, accidental (unintentional), initial encounter: Secondary | ICD-10-CM | POA: Diagnosis not present

## 2021-01-01 DIAGNOSIS — T6594XA Toxic effect of unspecified substance, undetermined, initial encounter: Secondary | ICD-10-CM

## 2021-01-01 DIAGNOSIS — F4310 Post-traumatic stress disorder, unspecified: Secondary | ICD-10-CM | POA: Insufficient documentation

## 2021-01-01 DIAGNOSIS — Z20822 Contact with and (suspected) exposure to covid-19: Secondary | ICD-10-CM | POA: Diagnosis not present

## 2021-01-01 LAB — RAPID URINE DRUG SCREEN, HOSP PERFORMED
Amphetamines: NOT DETECTED
Barbiturates: NOT DETECTED
Benzodiazepines: NOT DETECTED
Cocaine: NOT DETECTED
Opiates: NOT DETECTED
Tetrahydrocannabinol: NOT DETECTED

## 2021-01-01 LAB — COMPREHENSIVE METABOLIC PANEL
ALT: 24 U/L (ref 0–44)
AST: 19 U/L (ref 15–41)
Albumin: 4.2 g/dL (ref 3.5–5.0)
Alkaline Phosphatase: 112 U/L (ref 38–126)
Anion gap: 7 (ref 5–15)
BUN: 13 mg/dL (ref 6–20)
CO2: 24 mmol/L (ref 22–32)
Calcium: 9.2 mg/dL (ref 8.9–10.3)
Chloride: 105 mmol/L (ref 98–111)
Creatinine, Ser: 0.53 mg/dL (ref 0.44–1.00)
GFR, Estimated: >60 mL/min (ref >60)
Glucose, Bld: 109 mg/dL — ABNORMAL HIGH (ref 70–99)
Potassium: 3.8 mmol/L (ref 3.5–5.1)
Sodium: 136 mmol/L (ref 135–145)
Total Bilirubin: 1.2 mg/dL (ref 0.3–1.2)
Total Protein: 7.8 g/dL (ref 6.5–8.1)

## 2021-01-01 LAB — CBC
HCT: 37 % (ref 36.0–46.0)
Hemoglobin: 12.3 g/dL (ref 12.0–15.0)
MCH: 29.4 pg (ref 26.0–34.0)
MCHC: 33.2 g/dL (ref 30.0–36.0)
MCV: 88.3 fL (ref 80.0–100.0)
Platelets: 297 10*3/uL (ref 150–400)
RBC: 4.19 MIL/uL (ref 3.87–5.11)
RDW: 13.5 % (ref 11.5–15.5)
WBC: 7.7 10*3/uL (ref 4.0–10.5)
nRBC: 0 % (ref 0.0–0.2)

## 2021-01-01 LAB — I-STAT BETA HCG BLOOD, ED (MC, WL, AP ONLY): I-stat hCG, quantitative: <5 m[IU]/mL (ref <5)

## 2021-01-01 LAB — ACETAMINOPHEN LEVEL
Acetaminophen (Tylenol), Serum: <10 ug/mL — ABNORMAL LOW (ref 10–30)
Acetaminophen (Tylenol), Serum: <10 ug/mL — ABNORMAL LOW (ref 10–30)

## 2021-01-01 LAB — ETHANOL: Alcohol, Ethyl (B): <10 mg/dL (ref <10)

## 2021-01-01 LAB — SALICYLATE LEVEL: Salicylate Lvl: <7 mg/dL — ABNORMAL LOW (ref 7.0–30.0)

## 2021-01-01 LAB — MAGNESIUM: Magnesium: 1.9 mg/dL (ref 1.7–2.4)

## 2021-01-01 MED ORDER — ACETAMINOPHEN 500 MG PO TABS
1000.0000 mg | ORAL_TABLET | Freq: Once | ORAL | Status: AC
  Administered 2021-01-01: 1000 mg via ORAL
  Filled 2021-01-01: qty 2

## 2021-01-01 MED ORDER — ONDANSETRON HCL 4 MG/2ML IJ SOLN
4.0000 mg | Freq: Once | INTRAMUSCULAR | Status: AC
  Administered 2021-01-01: 4 mg via INTRAVENOUS
  Filled 2021-01-01: qty 2

## 2021-01-01 MED ORDER — IBUPROFEN 200 MG PO TABS
400.0000 mg | ORAL_TABLET | Freq: Once | ORAL | Status: AC
  Administered 2021-01-01: 400 mg via ORAL
  Filled 2021-01-01: qty 2

## 2021-01-01 MED ORDER — SODIUM CHLORIDE 0.9 % IV BOLUS
1000.0000 mL | Freq: Once | INTRAVENOUS | Status: AC
  Administered 2021-01-01: 1000 mL via INTRAVENOUS

## 2021-01-01 MED ORDER — FAMOTIDINE 20 MG PO TABS
40.0000 mg | ORAL_TABLET | Freq: Once | ORAL | Status: AC
  Administered 2021-01-01: 40 mg via ORAL
  Filled 2021-01-01: qty 2

## 2021-01-01 NOTE — BH Assessment (Signed)
This counselor contacted [facility] ED for tele-psych. Per RN patient still incredibly drowsy from overdose and not able to participate at this time. Will call back at a later time to assess.

## 2021-01-01 NOTE — ED Provider Notes (Addendum)
[facility]-EMERGENCY DEPT Provider Note   CSN: [id number] Arrival date & time: 01/01/21  1248     History Chief Complaint  Patient presents with  . Ingestion    Kristine [name] is a 40 y.o. female with past medical history of depression that presents to the emergency department today for ingestion.  Patient states that she took about 10-20 of her sertraline, over 25 mg tablets at 11:!5.  Patient states that she was extremely anxious, has a lot of things going on and just wanted all the thoughts and anxiety to stop.  Denies SI or HI, states that she has a lot going on.  Patient states that her father just died of cancer and her sister just died, patient is from [place] and she had just been murdered.  Patient states that she feels as if she is still with all the stress.  States that she was not intending on harming herself, states that she was just under a lot of pressure and stress and therefore she wanted to take all these medications.  Patient states that she did not take any other medications.  Currently feels nauseous, has been vomiting since then.  Has never done anything like this before.  Patient states that she has 3 kids at home that she has to take care of.  Denies chest pain, shortness of breath. Did not take any other medications.   HPI     Past Medical History:  Diagnosis Date  . Acute blood loss anemia 02/21/2013  . Anemia, iron deficiency 02/21/2013  . Cholestasis during pregnancy in second trimester 01/28/2018  . Constipation, chronic 12/12/2012  . Depression   . Previous cesarean delivery, antepartum condition or complication 11/22/2016  . Vitamin D deficiency, unspecified     Patient Active Problem List   Diagnosis Date Noted  . Syncope 03/28/2018  . PP C/S (8/16) 03/28/2018  . Cholestasis during pregnancy in second trimester 01/28/2018  . MDD (major depressive disorder), recurrent episode, moderate (HCC) 10/30/2017  . Perinatal depression  in first trimester 10/01/2017  . Previous cesarean delivery, antepartum condition or complication 11/22/2016  . Cesarean delivery delivered 11/22/2016  . Postpartum care following cesarean delivery (4/12) 11/22/2016  . Hemorrhoids, external, thrombosed s/p excision 12/12/2012 12/12/2012  . Constipation, chronic 12/12/2012    Past Surgical History:  Procedure Laterality Date  . CESAREAN SECTION N/A 02/20/2013   Procedure: CESAREAN SECTION;  Surgeon: Robley Fries, MD;  Location: [facility] ORS;  Service: Obstetrics;  Laterality: N/A;  . CESAREAN SECTION N/A 11/22/2016   Procedure: REPEAT CESAREAN SECTION;  Surgeon: Shea Evans, MD;  Location: [facility] BIRTHING SUITES;  Service: Obstetrics;  Laterality: N/A;  EDD: 11/29/16  . CESAREAN SECTION N/A 03/28/2018   Procedure: Repeat CESAREAN SECTION;  Surgeon: Shea Evans, MD;  Location: [facility] BIRTHING SUITES;  Service: Obstetrics;  Laterality: N/A;  EDD: 04/16/18     OB History    Gravida  3   Para  3   Term  3   Preterm      AB      Living  3     SAB      IAB      Ectopic      Multiple  0   Live Births  3           Family History  Problem Relation Age of Onset  . Hypertension Mother   . Cancer Mother        uterus  . Hypertension Paternal  Uncle   . Hypothyroidism Sister   . Other Neg Hx   . Hearing loss Neg Hx   . Alcohol abuse Neg Hx   . Arthritis Neg Hx   . Asthma Neg Hx   . Birth defects Neg Hx   . COPD Neg Hx   . Depression Neg Hx   . Diabetes Neg Hx   . Drug abuse Neg Hx   . Early death Neg Hx   . Heart disease Neg Hx   . Hyperlipidemia Neg Hx   . Kidney disease Neg Hx   . Learning disabilities Neg Hx   . Mental illness Neg Hx   . Mental retardation Neg Hx   . Miscarriages / Stillbirths Neg Hx   . Stroke Neg Hx   . Vision loss Neg Hx   . Seizures Neg Hx     Social History   Tobacco Use  . Smoking status: Never Smoker  . Smokeless tobacco: Never Used  Vaping Use  . Vaping Use: Never used  Substance Use  Topics  . Alcohol use: No  . Drug use: No    Home Medications Prior to Admission medications   Medication Sig Start Date End Date Taking? Authorizing Provider  acetaminophen (TYLENOL) 325 MG tablet Take 2 tablets (650 mg total) by mouth every 4 (four) hours as needed (for pain scale < 4). Patient not taking: Reported on 07/02/2018 03/30/18   Gunnar Bulla, CNM  azithromycin (ZITHROMAX) 250 MG tablet Take 1 tablet (250 mg total) by mouth daily. Take first 2 tablets together, then 1 every day until finished. 07/02/18   Khatri, Hina, PA-C  benzonatate (TESSALON) 100 MG capsule Take 1 capsule (100 mg total) by mouth every 8 (eight) hours. 07/02/18   Khatri, Hina, PA-C  HYDROMET 5-1.5 MG/5ML syrup Take 2.5-5 mLs by mouth every 8 (eight) hours as needed for cough or pain. 06/30/18   [provider]  ibuprofen (ADVIL,MOTRIN) 600 MG tablet Take 1 tablet (600 mg total) by mouth every 6 (six) hours. Patient taking differently: Take 600 mg by mouth every 6 (six) hours as needed for moderate pain.  03/30/18   Gunnar Bulla, CNM  iron polysaccharides (NIFEREX) 150 MG capsule Take 1 capsule (150 mg total) by mouth daily. Patient not taking: Reported on 07/02/2018 03/30/18   Gunnar Bulla, CNM  magnesium oxide (MAG-OX) 400 (241.3 Mg) MG tablet Take 1 tablet (400 mg total) by mouth daily. Patient not taking: Reported on 07/02/2018 03/30/18   Gunnar Bulla, CNM  methocarbamol (ROBAXIN) 500 MG tablet Take 1 tablet (500 mg total) by mouth 2 (two) times daily. 10/17/19   Mannie Stabile, PA-C  naproxen (NAPROSYN) 500 MG tablet Take 1 tablet (500 mg total) by mouth 2 (two) times daily. 10/17/19   Mannie Stabile, PA-C  ranitidine (ZANTAC) 150 MG tablet Take 1 tablet by mouth 2 (two) times daily. 06/30/18   [provider]    Allergies    Patient has no known allergies.  Review of Systems   Review of Systems  Constitutional: Negative for  chills, diaphoresis, fatigue and fever.  HENT: Negative for congestion, sore throat and trouble swallowing.   Eyes: Negative for pain and visual disturbance.  Respiratory: Negative for cough, shortness of breath and wheezing.   Cardiovascular: Negative for chest pain, palpitations and leg swelling.  Gastrointestinal: Positive for nausea and vomiting. Negative for abdominal distention, abdominal pain and diarrhea.  Genitourinary: Negative for difficulty urinating.  Musculoskeletal: Negative  for back pain, neck pain and neck stiffness.  Skin: Negative for pallor.  Neurological: Negative for dizziness, speech difficulty, weakness and headaches.  Psychiatric/Behavioral: Positive for agitation. Negative for confusion.    Physical Exam Updated Vital Signs BP 110/82   Pulse 84   Temp 98 F (36.7 C) (Oral)   Resp (!) 27   Ht  (1.6 m)   Wt 68 kg   SpO2 96%   BMI 26.56 kg/m   Physical Exam Constitutional:      General: She is not in acute distress.    Appearance: Normal appearance. She is not ill-appearing, toxic-appearing or diaphoretic.     Comments: Pt is vomiting, following commands, alert and oriented  HENT:     Mouth/Throat:     Mouth: Mucous membranes are moist.     Pharynx: Oropharynx is clear.  Eyes:     General: No scleral icterus.    Extraocular Movements: Extraocular movements intact.     Pupils: Pupils are equal, round, and reactive to light.  Cardiovascular:     Rate and Rhythm: Normal rate and regular rhythm.     Pulses: Normal pulses.     Heart sounds: Normal heart sounds.  Pulmonary:     Effort: Pulmonary effort is normal. No respiratory distress.     Breath sounds: Normal breath sounds. No stridor. No wheezing, rhonchi or rales.  Chest:     Chest wall: No tenderness.  Abdominal:     General: Abdomen is flat. There is no distension.     Palpations: Abdomen is soft.     Tenderness: There is no abdominal tenderness. There is no guarding or rebound.   Musculoskeletal:        General: No swelling or tenderness. Normal range of motion.     Cervical back: Normal range of motion and neck supple. No rigidity.     Right lower leg: No edema.     Left lower leg: No edema.  Skin:    General: Skin is warm and dry.     Capillary Refill: Capillary refill takes less than 2 seconds.     Coloration: Skin is not pale.  Neurological:     General: No focal deficit present.     Mental Status: She is alert and oriented to person, place, and time.     Cranial Nerves: No cranial nerve deficit.     Sensory: No sensory deficit.     Motor: No weakness.     Coordination: Coordination normal.     Gait: Gait normal.  Psychiatric:        Mood and Affect: Mood normal.        Behavior: Behavior normal.     Comments: Patient is tearful, anxious, denies SI or HI.     ED Results / Procedures / Treatments   Labs (all labs ordered are listed, but only abnormal results are displayed) Labs Reviewed  COMPREHENSIVE METABOLIC PANEL - Abnormal; Notable for the following components:      Result Value   Glucose, Bld 109 (*)    All other components within normal limits  SALICYLATE LEVEL - Abnormal; Notable for the following components:   Salicylate Lvl <7.0 (*)    All other components within normal limits  ACETAMINOPHEN LEVEL - Abnormal; Notable for the following components:   Acetaminophen (Tylenol), Serum <10 (*)    All other components within normal limits  ACETAMINOPHEN LEVEL - Abnormal; Notable for the following components:   Acetaminophen (Tylenol), Serum <10 (*)  All other components within normal limits  ETHANOL  CBC  RAPID URINE DRUG SCREEN, HOSP PERFORMED  MAGNESIUM  I-STAT BETA HCG BLOOD, ED ([facility], [facility], [facility] ONLY)    EKG EKG Interpretation  Date/Time:  Sunday Jan 01 2021 13:36:29 EDT Ventricular Rate:  68 PR Interval:  156 QRS Duration: 78 QT Interval:  362 QTC Calculation: 384 R Axis:   58 Text Interpretation: Normal sinus rhythm Normal  ECG since last tracing no significant change Confirmed by Mancel Bale [phone]) on 01/01/2021 1:43:44 PM   Radiology No results found.  Procedures Procedures   Medications Ordered in ED Medications  sodium chloride 0.9 % bolus 1,000 mL (1,000 mLs Intravenous New Bag/Given 01/01/21 1416)  ondansetron (ZOFRAN) injection 4 mg (4 mg Intravenous Given 01/01/21 1414)  ibuprofen (ADVIL) tablet 400 mg (400 mg Oral Given 01/01/21 1702)    ED Course  I have reviewed the triage vital signs and the nursing notes.  Pertinent labs & imaging results that were available during my care of the patient were reviewed by me and considered in my medical decision making (see chart for details).    MDM Rules/Calculators/A&P                          Kristine [name] is a 40 y.o. female with past medical history of depression that presents to the emergency department today for ingestion.  Patient took about ~15 sertraline, 25 mg.  Patient states that she was not intending to harm her self, however was having a lot of anxiety and she wanted the anxiety and her thoughts to stop.  Patient denies SI to me, however with ingestion I think patient will need to the TTS at this time.  Patient is anxious and vomiting on exam, will give fluids and Zofran at this time.  Did immediately speak to poison control, RN May, who recommends 6 hours of observation and no other treatment at this time.  Did speak to my attending, Dr. Clarice Pole who does recommend TTS evaluation, will not need to be placed under IVC.  If patient does attempt to leave she will need to be placed under IVC, sitter has been placed for patient.  Nursing aware of situation.  Do not think that patient will attempt to elope at this time, patient is vomiting and tearful.  Work-up today unremarkable, vital signs stable.  Upon reevaluation 5 hours later, patient still appears slightly drowsy, however is arousable and able to speak to me, normal neuro exam. No longer  nauseous or vomiting.  Patient has been medically cleared at this time, psych team did try to attempt speaking to patient, however at that time patient was too drowsy per [facility] note.  Did discuss with patient that patient needs to stay until psych evaluation otherwise patient will need to be placed under IVC, patient agreeable and states that she will stay because she wants help.  However patient does have a sitter in case she attempts to leave.   Patient has not been observed for 6 hours postingestion,Dr. Clarice Pole will take over pt and shift change.  Complete history and physical and current plan have been communicated.  Please refer to their note for the remainder of ED care and ultimate disposition.   Final Clinical Impression(s) / ED Diagnoses Final diagnoses:  Ingestion of substance, undetermined intent, initial encounter    Rx / DC Orders ED Discharge Orders    None  Farrel Gordonatel, Tyara Dassow, PA-C 01/01/21 Donavan [id number]    Pfeiffer, Marcy, MD 01/01/21 [id number]    Arby BarrettePfeiffer, Marcy, MD 01/01/21 2211

## 2021-01-01 NOTE — BH Assessment (Signed)
Clinician made contact with pt's nurse, Lurena Joiner RN, in an attempt to complete pt's MH Assessment. Pt's nurse explained she was unable to move the Tele-Assessment machine at this time due to working with another pt. Clinician and pt's nurse agreed pt's nurse would contact clinician when she is able to move the cart for pt's assessment.

## 2021-01-01 NOTE — ED Notes (Addendum)
Contacted poison control ; recommends 4 hr post ingestion tylenol level (315) and magnesium ; fluids and benzos as needed ; watch for at least 6 hours post ingestion. Spoke with Almira Coaster at poison control. Notified PA Shalyn of her recommendations.

## 2021-01-01 NOTE — BH Assessment (Addendum)
Comprehensive Clinical Assessment (CCA) Note  01/01/2021 Kristine [name] [id number]  Recommendations for Services/Supports/Treatments: Kristine Abtsody Taylor, PA, reviewed pt's chart and information and determined pt meets inpatient criteria. Pt's referral information will be [facility] to [facility], RN, for admission review at [name]. If there is no appropriate bed for pt at [name], pt's referral information will be faxed out to multiple hospitals for potential placement. This information was relayed to pt's providers at 2149.  The patient demonstrates the following risk factors for suicide: Chronic risk factors for suicide include: psychiatric disorder of MDD and PTSD. Acute risk factors for suicide include: family or marital conflict, social withdrawal/isolation and loss (financial, interpersonal, professional). Protective factors for this patient include: responsibility to others (children, family). Considering these factors, the overall suicide risk at this point appears to be none. Patient is not appropriate for outpatient follow up.  Therefore, no sitter is recommended for suicide precautions.  Flowsheet Row [facility]  C-SSRS RISK CATEGORY No Risk     Chief Complaint:  Chief Complaint  Patient presents with  . Ingestion   Visit Diagnosis: F33.2, Major depressive disorder, Recurrent episode, Severe; F43.10, Posttraumatic stress disorder   CCA Screening, Triage and Referral (STR) Kristine [name] is a 40 year old patient who was brought to the [name] after pt intentionally ingested 10 - 20 of her Sertraline in an attempt to stop thinking about her stressors. Pt states, "I took all my Zoloft pills. I was just overwhelmed and I just wanted to relax my brain. I feel hopeless that I wish I would die but I would never kill myself because of my children." Pt denies she was trying to kill herself.  Pt acknowledges having thoughts of dying in the past but  denies she has ever attempted to kill herself. She denies she has ever been hospitalized for mental health concerns or that she has a plan to kill herself. Pt denies HI, AVH, NSSIB, access to guns/weapons, engagement with the legal system, or SA.  Pt currently lives with her husband, three children, as well as her mother-in-law, her brother-in-law, his wife, and their child. Pt shares she is responsible for cooking and cleaning up after everyone, which is exhausting. Pt grew up in [place] during the war and shares growing up with rockets, bombs, and guns was normal, though she shares a schoolmate was hit by a stray bullet and died next to her. She shares her sister lives in [place] with her 6 children; her husband was just murdered. She shares her young niece is being forced to marry, which she and her sister are devastated about.  Pt shares she is prescribed Zolofy and Sertraline. She states she does not take them daily and instead takes them PRN. Pt shares she does not take the Zoloft daily because it works for her when she feels stressed but it also makes her drowsy, which makes it impossible for her to watch the children and clean the home.  Pt is oriented x5. Her recent/remote memory is intact. Pt was cooperative throughout the assessment process. Pt's insight, judgement, and impulse control is fair at this time.   Patient Reported Information How did you hear about us? Self  Referral name: Self  Referral phone number: 0 (N/A)   Whom do you see for routine medical problems? Other (Comment) (Various providers in Neurology, OBGYN, etc)  Practice/Facility Name: No data recorded Practice/Facility Phone Number: No data recorded Name of Contact: No data recorded Contact Number: No  data recorded Contact Fax Number: No data recorded Prescriber Name: No data recorded Prescriber Address (if known): No data recorded  What Is the Reason for Your Visit/Call Today? Pt shares she was  overwhelemed with her children, her home situation, and her sister being stuck in [place] with her 6 children. Pt shares she took Sertraline to shut down her brain and she wouldn't have to think.  How Long Has This Been Causing You Problems? > than 6 months  What Do You Feel Would Help You the Most Today? Treatment for Depression or other mood problem; Medication(s)   Have You Recently Been in Any Inpatient Treatment (Hospital/Detox/Crisis Center/28-Day Program)? No  Name/Location of Program/Hospital:No data recorded How Long Were You There? No data recorded When Were You Discharged? No data recorded  Have You Ever Received Services From [facility] Before? Yes  Who Do You See at [facility]? Various providers in Neurology and in OBGYN   Have You Recently Had Any Thoughts About Hurting Yourself? Yes  Are You Planning to Commit Suicide/Harm Yourself At This time? No   Have you Recently Had Thoughts About Hurting Someone [name]? No  Explanation: No data recorded  Have You Used Any Alcohol or Drugs in the Past 24 Hours? No  How Long Ago Did You Use Drugs or Alcohol? No data recorded What Did You Use and How Much? No data recorded  Do You Currently Have a Therapist/Psychiatrist? Yes  Name of Therapist/Psychiatrist: Not obtained   Have You Been Recently Discharged From Any Office Practice or Programs? No  Explanation of Discharge From Practice/Program: No data recorded    CCA Screening Triage Referral Assessment Type of Contact: Tele-Assessment  Is this Initial or Reassessment? Initial Assessment  Date Telepsych consult ordered in [facility]:  01/01/2021  Time Telepsych consult ordered in [facility]:  1406   Patient Reported Information Reviewed? Yes  Patient Left Without Being Seen? No data recorded Reason for Not Completing Assessment: No data recorded  Collateral Involvement: Not obtained at this time   Does Patient Have a Court Appointed Legal Guardian? No data  recorded Name and Contact of Legal Guardian: No data recorded If Minor and Not Living with Parent(s), Who has Custody? N/A  Is CPS involved or ever been involved? Never  Is APS involved or ever been involved? Never   Patient Determined To Be At Risk for Harm To Self or Others Based on Review of Patient Reported Information or Presenting Complaint? Yes, for Self-Harm  Method: No data recorded Availability of Means: No data recorded Intent: No data recorded Notification Required: No data recorded Additional Information for Danger to Others Potential: No data recorded Additional Comments for Danger to Others Potential: No data recorded Are There Guns or Other Weapons in Your Home? No data recorded Types of Guns/Weapons: No data recorded Are These Weapons Safely Secured?                            No data recorded Who Could Verify You Are Able To Have These Secured: No data recorded Do You Have any Outstanding Charges, Pending Court Dates, Parole/Probation? No data recorded Contacted To Inform of Risk of Harm To Self or Others: Family/Significant Other: (Pt's family is aware)   Location of Assessment: [facility] ED   Does Patient Present under Involuntary Commitment? No  IVC Papers Initial File Date: No data recorded  [place] of Residence: [place]   Patient Currently Receiving the Following Services: Medication  Management   Determination of Need: Emergent (2 hours)   Options For Referral: Medication Management; Outpatient Therapy; Inpatient Hospitalization     CCA Biopsychosocial Intake/Chief Complaint:  Pt shares she was overwhelemed with her children, her home situation, and her sister being stuck in [place] with her 6 children. Pt shares she took Sertraline to shut down her brain and she wouldn't have to think.  Current Symptoms/Problems: Pt has been feeling stressed and overwhelmed   Patient Reported Schizophrenia/Schizoaffective Diagnosis in Past: No   Strengths:  Not assessed  Preferences: Not assessed  Abilities: Not assessed   Type of Services Patient Feels are Needed: Not assessed   Initial Clinical Notes/Concerns: Pt would like to be d/c home so she can care for her children.   Mental Health Symptoms Depression:  Difficulty Concentrating; Change in energy/activity; Fatigue; Hopelessness; Increase/decrease in appetite; Sleep (too much or little); Tearfulness; Weight gain/loss   Duration of Depressive symptoms: Kristine than two weeks   Mania:  None   Anxiety:   Worrying; Tension; Sleep; Fatigue; Difficulty concentrating   Psychosis:  None   Duration of Psychotic symptoms: No data recorded  Trauma:  None   Obsessions:  None   Compulsions:  None   Inattention:  None   Hyperactivity/Impulsivity:  N/A   Oppositional/Defiant Behaviors:  None   Emotional Irregularity:  Potentially harmful impulsivity   Other Mood/Personality Symptoms:  None noted    Mental Status Exam Appearance and self-care  Stature:  Average   Weight:  Average weight   Clothing:  -- (Pt is dressed in scrubs)   Grooming:  Normal   Cosmetic use:  None   Posture/gait:  Normal   Motor activity:  Not Remarkable   Sensorium  Attention:  Normal   Concentration:  Normal   Orientation:  X5   Recall/memory:  Normal   Affect and Mood  Affect:  Depressed   Mood:  Depressed   Relating  Eye contact:  Normal   Facial expression:  Depressed   Attitude toward examiner:  Cooperative   Thought and Language  Speech flow: Clear and Coherent   Thought content:  Appropriate to Mood and Circumstances   Preoccupation:  None   Hallucinations:  None   Organization:  No data recorded  [employer] of Knowledge:  Average   Intelligence:  Average   Abstraction:  Normal   Judgement:  Fair   Dance movement psychotherapist:  Realistic   Insight:  Fair   Decision Making:  Impulsive   Social Functioning  Social Maturity:  Responsible    Social Judgement:  Normal   Stress  Stressors:  Family conflict; Grief/losses; Relationship; Housing   Coping Ability:  Exhausted; Overwhelmed   Skill Deficits:  Self-control   Supports:  Support needed     Religion: Religion/Spirituality Are You A Religious Person?:  (Not assessed) How Might This Affect Treatment?: Not assessed  Leisure/Recreation: Leisure / Recreation Do You Have Hobbies?:  (Not assessed)  Exercise/Diet: Exercise/Diet Do You Exercise?:  (Not assessed) Have You Gained or Lost A Significant Amount of Weight in the Past Six Months?:  (Not assessed) Do You Follow a Special Diet?:  (Not assessed) Do You Have Any Trouble Sleeping?:  (Not assessed)   CCA Employment/Education Employment/Work Situation: Employment / Work Situation Employment situation: Employed Where is patient currently employed?: Pt works PT at [employer] and at another facility one day a week each, 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 How long has  patient been employed?: Not assessed Patient's job has been impacted by current illness: No What is the longest time patient has a held a job?: Not assessed Where was the patient employed at that time?: Not assessed Has patient ever been in the [employer]?: No  Education: Education Is Patient Currently Attending School?: No Last Grade Completed:  (Not assessed) Name of High School: Not assessed Did Garment/textile technologist From [employer]?:  (Not assessed) Did You Attend College?: Yes What Type of College Degree Do you Have?: Pt has a BS in computers and is working to get her BS in nursing Did You Attend Doctor, general practice?:  (Not assessed) What Was Your Major?: Computer Did You Have Any Special Interests In School?: Not assessed Did You Have An Individualized Education Program (IIEP):  (Not assessed) Did You Have Any Difficulty At School?:  (Not assessed) Patient's Education Has Been Impacted by Current Illness:  (Not assessed)   CCA  Family/Childhood History Family and Relationship History: Family history Marital status: Married Number of Years Married:  (Not assessed) What types of issues is patient dealing with in the relationship?: Not assessed Additional relationship information: Not assessed Are you sexually active?:  (Not assessed) What is your sexual orientation?: Not assessed Has your sexual activity been affected by drugs, alcohol, medication, or emotional stress?: Not assessed Does patient have children?: Yes How many children?: 3 How is patient's relationship with their children?: Pt shares her children do not listen to her or follow her directions  Childhood History:  Childhood History By whom was/is the patient raised?: Both parents Additional childhood history information: Pt grew up in [place] during the war and shares that rockets and guns were normal for her. Description of patient's relationship with caregiver when they were a child: Not assessed Patient's description of current relationship with people who raised him/her: Not assessed How were you disciplined when you got in trouble as a child/adolescent?: Not assessed Does patient have siblings?: Yes Number of Siblings:  (At least one) Description of patient's current relationship with siblings: Pt has a sister in [place] that she is close with Did patient suffer any verbal/emotional/physical/sexual abuse as a child?: No Did patient suffer from severe childhood neglect?: No Has patient ever been sexually abused/assaulted/raped as an adolescent or adult?: No Was the patient ever a victim of a crime or a disaster?: Yes Patient description of being a victim of a crime or disaster: Pt grew up in [place] during the war Witnessed domestic violence?: No Has patient been affected by domestic violence as an adult?: No  Child/Adolescent Assessment:     CCA Substance Use Alcohol/Drug Use: Alcohol / Drug Use Pain Medications: See  MAR Prescriptions: See MAR Over the Counter: See MAR History of alcohol / drug use?: No history of alcohol / drug abuse Longest period of sobriety (when/how long): N/A Negative Consequences of Use:  (N/A) Withdrawal Symptoms:  (N/A)                         ASAM's:  Six Dimensions of Multidimensional Assessment  Dimension 1:  Acute Intoxication and/or Withdrawal Potential:      Dimension 2:  Biomedical Conditions and Complications:      Dimension 3:  Emotional, Behavioral, or Cognitive Conditions and Complications:     Dimension 4:  Readiness to Change:     Dimension 5:  Relapse, Continued use, or Continued Problem Potential:     Dimension 6:  Recovery/Living Environment:  ASAM Severity Score:    ASAM Recommended Level of Treatment: ASAM Recommended Level of Treatment:  (N/A)   Substance use Disorder (SUD) Substance Use Disorder (SUD)  Checklist Symptoms of Substance Use:  (N/A)  Recommendations for Services/Supports/Treatments: Recommendations for Services/Supports/Treatments Recommendations For Services/Supports/Treatments: Individual Therapy,Medication Management,Inpatient Hospitalization  Kristine Abts, [place], reviewed pt's chart and information and determined pt meets inpatient criteria. Pt's referral information will be provided to [facility], RN, for admission review at [facility]. If there is no appropriate bed for pt at [facility], pt's referral information will be faxed out to multiple hospitals for potential placement. This information was relayed to pt's providers at 2149.   DSM5 Diagnoses: Patient Active Problem List   Diagnosis Date Noted  . Syncope 03/28/2018  . PP C/S (8/16) 03/28/2018  . Cholestasis during pregnancy in second trimester 01/28/2018  . MDD (major depressive disorder), recurrent episode, moderate (HCC) 10/30/2017  . Perinatal depression in first trimester 10/01/2017  . Previous cesarean delivery, antepartum condition or complication 11/22/2016  .  Cesarean delivery delivered 11/22/2016  . Postpartum care following cesarean delivery (4/12) 11/22/2016  . Hemorrhoids, external, thrombosed s/p excision 12/12/2012 12/12/2012  . Constipation, chronic 12/12/2012    Patient Centered Plan: Patient is on the following Treatment Plan(s):  Anxiety and Depression   Referrals to Alternative Service(s): Referred to Alternative Service(s):   Place:   Date:   Time:    Referred to Alternative Service(s):   Place:   Date:   Time:    Referred to Alternative Service(s):   Place:   Date:   Time:    Referred to Alternative Service(s):   Place:   Date:   Time:     Ralph Dowdy, LMFT

## 2021-01-01 NOTE — ED Notes (Addendum)
Pt changed into purple scrubs, belongings including iphone and charger locked in 5-8 pt belongings cabinet. Pt displays understanding. Pt unable to take off 3 metal bracelets and her wedding band, states "I can never get them off". Pt vomiting. Notified MD Pfeiffer

## 2021-01-01 NOTE — ED Triage Notes (Addendum)
Pt was feeling overwhelmed, anxious and tired so took approx 1/4 bottle of 25 mg tablets of Sertraline @1115 . Pt does not know how many, estimates 10-20. Pt denies any suicidal thoughts or thoughts of self harm. Just states "I wanted my anxiety to go away". Tearful in triage. Pt vomited x1 since ingestion.

## 2021-01-01 NOTE — [facility] Counselor (Signed)
Patient's RN alerted this counselor that patient is now awake and able to participate in assessment. TTS to seen as soon as next counselor is available.

## 2021-01-02 ENCOUNTER — Ambulatory Visit (HOSPITAL_COMMUNITY)
Admission: EM | Admit: 2021-01-02 | Discharge: 2021-01-02 | Disposition: A | Discharge status: Home / Self Care | Attending: Urology | Admitting: Urology

## 2021-01-02 DIAGNOSIS — T43201A Poisoning by unspecified antidepressants, accidental (unintentional), initial encounter: Secondary | ICD-10-CM | POA: Diagnosis not present

## 2021-01-02 DIAGNOSIS — F331 Major depressive disorder, recurrent, moderate: Secondary | ICD-10-CM

## 2021-01-02 LAB — RESP PANEL BY RT-PCR (FLU A&B, COVID) ARPGX2
Influenza A by PCR: NEGATIVE
Influenza B by PCR: NEGATIVE
SARS Coronavirus 2 by RT PCR: NEGATIVE

## 2021-01-02 MED ORDER — MAGNESIUM HYDROXIDE 400 MG/5ML PO SUSP: 30.0000 mL | Freq: Every day | ORAL | Status: DC | PRN

## 2021-01-02 MED ORDER — ACETAMINOPHEN 325 MG PO TABS: 650.0000 mg | ORAL_TABLET | Freq: Four times a day (QID) | ORAL | Status: DC | PRN

## 2021-01-02 MED ORDER — ALUM & MAG HYDROXIDE-SIMETH 200-200-20 MG/5ML PO SUSP: 30.0000 mL | ORAL | Status: DC | PRN

## 2021-01-02 MED ORDER — HYDROXYZINE HCL 25 MG PO TABS: 25.0000 mg | ORAL_TABLET | Freq: Three times a day (TID) | ORAL | Status: DC | PRN

## 2021-01-02 NOTE — ED Notes (Signed)
Meal given

## 2021-01-02 NOTE — Progress Notes (Signed)
Kristine [name] received her AVS, questions answered and retrieved her personal belongings. She was escorted to the lobby where her transport ride was waiting.

## 2021-01-02 NOTE — ED Notes (Signed)
Pt has been brought onto unit and familiarized with unit, patient is lying in bed, breathing unlabored, environment secured, nutrition offered. Will continue to monitor patient.

## 2021-01-02 NOTE — ED Notes (Signed)
Pt states she had an upset stomach earlier. I advised the patient that we are out of ginger ale but offered her some lemonade. She states she was given medication at the emergency room

## 2021-01-02 NOTE — ED Notes (Signed)
Attempted call to safe transport. Left VM

## 2021-01-02 NOTE — Discharge Instructions (Addendum)

## 2021-01-02 NOTE — ED Notes (Signed)
Report given to [facility] 

## 2021-01-02 NOTE — ED Provider Notes (Signed)
Behavioral Health Admission [facility] & OBS)  Date: 01/02/21 Patient Name: Kristine [name] MRN: [id number] Chief Complaint: No chief complaint on file.     Diagnoses:  Final diagnoses:  None    HPI: Kristine [name] is a 40y/o female. Patient presented voluntarily to [facility] with unintentional overdose.  [facility] was transfer [facility] for continuous assessment. Patient reported that she ingested ~10-20 sertraline 25mg  in attempt to relieve anxiety and for sleep. Patient adamently denied that is was a suicide attempt. She report that she is experiencing a lot of stress. patient report that her main stressors her: father dying from cancer, financially supporting her sister since the loss of brother inlaw in , and increased demand in maintaining her household.   Patient report that she lives at home with her husband, mother-inlaw, and brother inlaw and his family. She reports that she does all the household chores and has little to no help from the adults in the home. She reports that she is constantly under stress due to her mother-inlaw demanding that she maintains a clean home, cooks, care for the children, all while working as a [place]. She report that she was exhausted and took excess amount of her sertraline to help relieved anxiety.   She denies SI, HI, AVH, paranoia, and no delusional thought content noted. She works as a Engineer, materials. She denies alcohol and illicit drug use. She denies access to gun/firarm.     PHQ 2-9:  Flowsheet Row ED from 01/01/2021 in [facility]ENCY DE[facility]  Thoughts that you would be better off dead, or of hurting yourself in some way Not at all  PHQ-9 Total Score 14      Flowsheet Row ED from 01/01/2021 in [facility]  C-SSRS RISK CATEGORY No Risk       Total Time spent with patient: 20 minutes  Musculoskeletal  Strength & Muscle Tone: within normal limits Gait & Station:  normal Patient leans: Right  Psychiatric Specialty Exam  Presentation General Appearance: Appropriate for Environment  Eye Contact:Good  Speech:Clear and Coherent  Speech Volume:Normal  Handedness:Right   Mood and Affect  Mood:Euthymic  Affect:Congruent   Thought Process  Thought Processes:Coherent  Descriptions of Associations:Intact  Orientation:Full (Time, Place and Person)  Thought Content:WDL  Diagnosis of Schizophrenia or Schizoaffective disorder in past: No   Hallucinations:No data recorded Ideas of Reference:No data recorded Suicidal Thoughts:Suicidal Thoughts: No  Homicidal Thoughts:Homicidal Thoughts: No   Sensorium  Memory:Immediate Good; Recent Good; Remote Good  Judgment:Good  Insight:Good   Executive Functions  Concentration:Good  Attention Span:Good  Recall:Good  Fund of Knowledge:Good  Language:No data recorded  Psychomotor Activity  Psychomotor Activity:Psychomotor Activity: Normal   Assets  Assets:Communication Skills; Desire for Improvement; Housing; Transportation; Vocational/Educational   Sleep  Sleep:Sleep: Fair   Nutritional Assessment (For OBS and [facility] admissions only) Has the patient had a weight loss or gain of 10 pounds or more in the last 3 months?: No Has the patient had a decrease in food intake/or appetite?: No Does the patient have dental problems?: No Does the patient have eating habits or behaviors that may be indicators of an eating disorder including binging or inducing vomiting?: No Has the patient recently lost weight without trying?: No Has the patient been eating poorly because of a decreased appetite?: No Malnutrition Screening Tool Score: 0    Physical Exam Constitutional:      General: She is not in acute distress.    Appearance: She  is not toxic-appearing.  HENT:     Head: Normocephalic.     Nose: Nose normal.  Eyes:     General:        Right eye: No discharge.        Left eye: No  discharge.  Cardiovascular:     Rate and Rhythm: Normal rate.  Pulmonary:     Effort: Pulmonary effort is normal. No respiratory distress.  Musculoskeletal:        General: Normal range of motion.     Cervical back: Normal range of motion.  Skin:    General: Skin is warm.  Neurological:     Mental Status: She is alert and oriented to person, place, and time.  Psychiatric:        Attention and Perception: She is attentive. She does not perceive visual hallucinations.        Mood and Affect: Mood and affect normal.        Speech: Speech normal.        Behavior: Behavior normal. Behavior is cooperative.        Thought Content: Thought content normal. Thought content is not paranoid or delusional. Thought content does not include homicidal or suicidal ideation. Thought content does not include homicidal plan.        Cognition and Memory: Cognition normal.    Review of Systems  Constitutional: Negative.   HENT: Negative.   Eyes: Negative.   Respiratory: Negative.   Cardiovascular: Negative.   Gastrointestinal: Negative.   Genitourinary: Negative.   Musculoskeletal: Negative.   Skin: Negative.   Neurological: Negative.   Endo/Heme/Allergies: Negative.   Psychiatric/Behavioral: Negative.     Blood pressure 116/87, pulse 72, temperature 98.6 F (37 C), temperature source Oral, resp. rate 16, SpO2 99 %, currently breastfeeding. There is no height or weight on file to calculate BMI.  Past Psychiatric History:  MDD  Is the patient at risk to self? No  Has the patient been a risk to self in the past 6 months? No .    Has the patient been a risk to self within the distant past? No   Is the patient a risk to others? No   Has the patient been a risk to others in the past 6 months? No   Has the patient been a risk to others within the distant past? No   Past Medical History:  Past Medical History:  Diagnosis Date  . Acute blood loss anemia 02/21/2013  . Anemia, iron deficiency  02/21/2013  . Cholestasis during pregnancy in second trimester 01/28/2018  . Constipation, chronic 12/12/2012  . Depression   . Previous cesarean delivery, antepartum condition or complication 11/22/2016  . Vitamin D deficiency, unspecified     Past Surgical History:  Procedure Laterality Date  . CESAREAN SECTION N/A 02/20/2013   Procedure: CESAREAN SECTION;  Surgeon: Robley FriesVaishali R Mody, MD;  Location: [facility];  Service: Obstetrics;  Laterality: N/A;  . CESAREAN SECTION N/A 11/22/2016   Procedure: REPEAT CESAREAN SECTION;  Surgeon: Shea EvansVaishali Mody, MD;  [facility];  Service: Obstetrics;  Laterality: N/A;  EDD: 11/29/16  . CESAREAN SECTION N/A 03/28/2018   Procedure: Repeat CESAREAN SECTION;  Surgeon: Shea EvansMody, Vaishali, MD;  [facility];  Service: Obstetrics;  Laterality: N/A;  EDD: 04/16/18    Family History:  Family History  Problem Relation Age of Onset  . Hypertension Mother   . Cancer Mother        uterus  . Hypertension  Paternal Uncle   . Hypothyroidism Sister   . Other Neg Hx   . Hearing loss Neg Hx   . Alcohol abuse Neg Hx   . Arthritis Neg Hx   . Asthma Neg Hx   . Birth defects Neg Hx   . COPD Neg Hx   . Depression Neg Hx   . Diabetes Neg Hx   . Drug abuse Neg Hx   . Early death Neg Hx   . Heart disease Neg Hx   . Hyperlipidemia Neg Hx   . Kidney disease Neg Hx   . Learning disabilities Neg Hx   . Mental illness Neg Hx   . Mental retardation Neg Hx   . Miscarriages / Stillbirths Neg Hx   . Stroke Neg Hx   . Vision loss Neg Hx   . Seizures Neg Hx     Social History:  Social History   Socioeconomic History  . Marital status: Married    Spouse name: Not on file  . Number of children: 3  . Years of education: Not on file  . Highest education level: Bachelor's degree (e.g., BA, AB, BS)  Occupational History  . Not on file  Tobacco Use  . Smoking status: Never Smoker  . Smokeless tobacco: Never Used  Vaping Use  . Vaping Use: Never used   Substance and Sexual Activity  . Alcohol use: No  . Drug use: No  . Sexual activity: Yes    Birth control/protection: None  Other Topics Concern  . Not on file  Social History Narrative   Lives at home with her husband and children   Right handed   Caffeine: tea, >10 cups daily   Social Determinants of Health   Financial Resource Strain: Not on file  Food Insecurity: Not on file  Transportation Needs: Not on file  Physical Activity: Not on file  Stress: Not on file  Social Connections: Not on file  Intimate Partner Violence: Not on file    SDOH:  SDOH Screenings   Alcohol Screen: Not on file  Depression (PHQ2-9): Medium Risk  . PHQ-2 Score: 14  Financial Resource Strain: Not on file  Food Insecurity: Not on file  Housing: Not on file  Physical Activity: Not on file  Social Connections: Not on file  Stress: Not on file  Tobacco Use: Not on file  Transportation Needs: Not on file    Last Labs:  Admission on 01/01/2021, Discharged on 01/02/2021  Component Date Value Ref Range Status  . Sodium 01/01/2021 136  135 - 145 mmol/L Final  . Potassium 01/01/2021 3.8  3.5 - 5.1 mmol/L Final  . Chloride 01/01/2021 105  98 - 111 mmol/L Final  . CO2 01/01/2021 24  22 - 32 mmol/L Final  . Glucose, Bld 01/01/2021 109* 70 - 99 mg/dL Final   Glucose reference range applies only to samples taken after fasting for at least 8 hours.  . BUN 01/01/2021 13  6 - 20 mg/dL Final  . Creatinine, Ser 01/01/2021 0.53  0.44 - 1.00 mg/dL Final  . Calcium 95/62/1308 9.2  8.9 - 10.3 mg/dL Final  . Total Protein 01/01/2021 7.8  6.5 - 8.1 g/dL Final  . Albumin 65/78/4696 4.2  3.5 - 5.0 g/dL Final  . AST 29/52/8413 19  15 - 41 U/L Final  . ALT 01/01/2021 24  0 - 44 U/L Final  . Alkaline Phosphatase 01/01/2021 112  38 - 126 U/L Final  . Total Bilirubin 01/01/2021 1.2  0.3 - 1.2 mg/dL Final  . GFR, Estimated 01/01/2021 >60  >60 mL/min Final   Comment: (NOTE) Calculated using the CKD-EPI  Creatinine Equation (2021)   . Anion gap 01/01/2021 7  5 - 15 Final   Performed at [facility], [address], [place], [place] [address]  . Alcohol, Ethyl (B) 01/01/2021 <10  <10 mg/dL Final   Comment: (NOTE) Lowest detectable limit for serum alcohol is 10 mg/dL.  For medical purposes only. Performed at [facility], [address] [place], [place] [address]   . WBC 01/01/2021 7.7  4.0 - 10.5 K/uL Final  . RBC 01/01/2021 4.19  3.87 - 5.11 MIL/uL Final  . Hemoglobin 01/01/2021 12.3  12.0 - 15.0 g/dL Final  . HCT 29/52/8413 37.0  36.0 - 46.0 % Final  . MCV 01/01/2021 88.3  80.0 - 100.0 fL Final  . MCH 01/01/2021 29.4  26.0 - 34.0 pg Final  . MCHC 01/01/2021 33.2  30.0 - 36.0 g/dL Final  . RDW 24/40/1027 13.5  11.5 - 15.5 % Final  . Platelets 01/01/2021 297  150 - 400 K/uL Final  . nRBC 01/01/2021 0.0  0.0 - 0.2 % Final   Performed at [facility], [address], [place], [place] [address]  . Opiates 01/01/2021 NONE DETECTED  NONE DETECTED Final  . Cocaine 01/01/2021 NONE DETECTED  NONE DETECTED Final  . Benzodiazepines 01/01/2021 NONE DETECTED  NONE DETECTED Final  . Amphetamines 01/01/2021 NONE DETECTED  NONE DETECTED Final  . Tetrahydrocannabinol 01/01/2021 NONE DETECTED  NONE DETECTED Final  . Barbiturates 01/01/2021 NONE DETECTED  NONE DETECTED Final   Comment: (NOTE) DRUG SCREEN FOR MEDICAL PURPOSES ONLY.  IF CONFIRMATION IS NEEDED FOR ANY PURPOSE, NOTIFY LAB WITHIN 5 DAYS.  LOWEST DETECTABLE LIMITS FOR URINE DRUG SCREEN Drug Class                     Cutoff (ng/mL) Amphetamine and metabolites    1000 Barbiturate and metabolites    200 Benzodiazepine                 200 Tricyclics and metabolites     300 Opiates and metabolites        300 Cocaine and metabolites        300 THC                            50 Performed at [facility], [address], [place], [place] [address]   . I-stat  hCG, quantitative 01/01/2021 <5.0  <5 mIU/mL Final  . Comment 3 01/01/2021          Final   Comment:   GEST. AGE      CONC.  (mIU/mL)   <=1 WEEK        5 - 50     2 WEEKS       50 - 500     3 WEEKS       100 - 10,000     4 WEEKS     1,000 - 30,000        FEMALE AND NON-PREGNANT FEMALE:     LESS THAN 5 mIU/mL   . Magnesium 01/01/2021 1.9  1.7 - 2.4 mg/dL Final   Performed at [facility], [address], [place], [place] [address]  . Salicylate Lvl 01/01/2021 <7.0* 7.0 - 30.0 mg/dL Final  Performed at [facility], [address], [place], [place] [address]  . Acetaminophen (Tylenol), Serum 01/01/2021 <10* 10 - 30 ug/mL Final   Comment: (NOTE) Therapeutic concentrations vary significantly. A range of 10-30 ug/mL  may be an effective concentration for many patients. However, some  are best treated at concentrations outside of this range. Acetaminophen concentrations >150 ug/mL at 4 hours after ingestion  and >50 ug/mL at 12 hours after ingestion are often associated with  toxic reactions.  Performed at [facility], [address], [place], [place] [address]   . Acetaminophen (Tylenol), Serum 01/01/2021 <10* 10 - 30 ug/mL Final   Comment: (NOTE) Therapeutic concentrations vary significantly. A range of 10-30 ug/mL  may be an effective concentration for many patients. However, some  are best treated at concentrations outside of this range. Acetaminophen concentrations >150 ug/mL at 4 hours after ingestion  and >50 ug/mL at 12 hours after ingestion are often associated with  toxic reactions.  Performed at [facility], [address] [place], [place] [address]   . SARS Coronavirus 2 by RT PCR 01/02/2021 NEGATIVE  NEGATIVE Final   Comment: (NOTE) SARS-CoV-2 target nucleic acids are NOT DETECTED.  The SARS-CoV-2 RNA is generally detectable in upper respiratory specimens during the acute phase of  infection. The lowest concentration of SARS-CoV-2 viral copies this assay can detect is 138 copies/mL. A negative result does not preclude SARS-Cov-2 infection and should not be used as the sole basis for treatment or other patient management decisions. A negative result may occur with  improper specimen collection/handling, submission of specimen other than nasopharyngeal swab, presence of viral mutation(s) within the areas targeted by this assay, and inadequate number of viral copies(<138 copies/mL). A negative result must be combined with clinical observations, patient history, and epidemiological information. The expected result is Negative.  Fact Sheet for Patients:  BloggerCourse.com  Fact Sheet for Healthcare Providers:  SeriousBroker.it  This test is no                          t yet approved or cleared by the [place] FDA and  has been authorized for detection and/or diagnosis of SARS-CoV-2 by FDA under an Emergency Use Authorization (EUA). This EUA will remain  in effect (meaning this test can be used) for the duration of the COVID-19 declaration under Section 564(b)(1) of the Act, 21 U.S.C.section 360bbb-3(b)(1), unless the authorization is terminated  or revoked sooner.      . Influenza A by PCR 01/02/2021 NEGATIVE  NEGATIVE Final  . Influenza B by PCR 01/02/2021 NEGATIVE  NEGATIVE Final   Comment: (NOTE) The Xpert Xpress SARS-CoV-2/FLU/RSV plus assay is intended as an aid in the diagnosis of influenza from Nasopharyngeal swab specimens and should not be used as a sole basis for treatment. Nasal washings and aspirates are unacceptable for Xpert Xpress SARS-CoV-2/FLU/RSV testing.  Fact Sheet for Patients: BloggerCourse.com  Fact Sheet for Healthcare Providers: SeriousBroker.it  This test is not yet approved or cleared by the [place] FDA and has been  authorized for detection and/or diagnosis of SARS-CoV-2 by FDA under an Emergency Use Authorization (EUA). This EUA will remain in effect (meaning this test can be used) for the duration of the COVID-19 declaration under Section 564(b)(1) of the Act, 21 U.S.C. section 360bbb-3(b)(1), unless the authorization is terminated or revoked.  Performed at [facility], [address] [place], [place] [address]  Allergies: Patient has no known allergies.  PTA Medications: (Not in a hospital admission)   Medical Decision Making  TTS Counselor was unable to get collateral; will [facility] to [facility] for [facility] observation and continuous assessment pending collateral information. Psychiatry will reassess patient on 01/02/21 and obtain collateral information. -reviewed labs. -hold off on restarting SSRI due to recent overdose     Recommendations  Based on my evaluation the patient does not appear to have an emergency medical condition.  Maricela Bo, NP 01/02/21  5:35 AM

## 2021-01-02 NOTE — ED Provider Notes (Signed)
FBC/OBS ASAP Discharge Summary  Date and Time: 01/02/2021 10:26 AM  Name: Kristine [name]  MRN:  [id number]   Discharge Diagnoses:  Final diagnoses:  MDD (major depressive disorder), recurrent episode, moderate (HCC)  Antidepressant overdose, accidental or unintentional, initial encounter    Subjective: Patient reports that she is feeling good today.  Patient denies any suicidal or homicidal ideations and denies any hallucinations.  Patient reports that she had never intended to harm herself and that she only took the medication because she was told it was for anxiety so she took extra to try to get rid of her anxiety.  She stated that when she started vomiting due to how many she took she came to the emergency room.  Patient reports that she does have a lot of stress in her life because she has 3 kids and a dog and takes care of her home.  She reports that she feels that she is ready to discharge home and that I can contact her husband, Kristine [name]. The patient's husband was contacted at [phone].  He reports that he does not feel that this was an intentional overdose and that this was not a suicide attempt.  He states that he feels safe with the patient discharging home and he has no safety concerns at this time.  He states that he will come and pick patient up today.  Stay Summary: Patient is a 40 year old female presented to [facility] after an unintentional overdose of 10-20 Zoloft 25 mg.  She states that she was taking extra pills to try to get rid of her anxiety and to get some sleep.  She denied that this was a suicide attempt but reported starting to vomit and became concerned so she came to the hospital to be evaluated.  Patient was transferred to the [facility] she admitted to continuous observation unit for overnight assessment.  Today the patient had continued denying any suicidal or homicidal ideations and denying hallucinations.  Patient does report numerous stressors at home.  Patient's  husband provided collateral and safety planning and had no safety concerns with the patient discharging home.  The patient was provided with outpatient resources for therapy as well as psychiatry.  Patient's husband will come pick the patient up.  Total Time spent with patient: 20 minutes  Past Psychiatric History: depression Past Medical History:  Past Medical History:  Diagnosis Date  . Acute blood loss anemia 02/21/2013  . Anemia, iron deficiency 02/21/2013  . Cholestasis during pregnancy in second trimester 01/28/2018  . Constipation, chronic 12/12/2012  . Depression   . Previous cesarean delivery, antepartum condition or complication 11/22/2016  . Vitamin D deficiency, unspecified     Past Surgical History:  Procedure Laterality Date  . CESAREAN SECTION N/A 02/20/2013   Procedure: CESAREAN SECTION;  Surgeon: Robley Fries, MD;  Location: [facility] ORS;  Service: Obstetrics;  Laterality: N/A;  . CESAREAN SECTION N/A 11/22/2016   Procedure: REPEAT CESAREAN SECTION;  Surgeon: Shea Evans, MD;  Location: [facility] BIRTHING SUITES;  Service: Obstetrics;  Laterality: N/A;  EDD: 11/29/16  . CESAREAN SECTION N/A 03/28/2018   Procedure: Repeat CESAREAN SECTION;  Surgeon: Shea Evans, MD;  Location: [facility] BIRTHING SUITES;  Service: Obstetrics;  Laterality: N/A;  EDD: 04/16/18   Family History:  Family History  Problem Relation Age of Onset  . Hypertension Mother   . Cancer Mother        uterus  . Hypertension Paternal Uncle   . Hypothyroidism Sister   .  Other Neg Hx   . Hearing loss Neg Hx   . Alcohol abuse Neg Hx   . Arthritis Neg Hx   . Asthma Neg Hx   . Birth defects Neg Hx   . COPD Neg Hx   . Depression Neg Hx   . Diabetes Neg Hx   . Drug abuse Neg Hx   . Early death Neg Hx   . Heart disease Neg Hx   . Hyperlipidemia Neg Hx   . Kidney disease Neg Hx   . Learning disabilities Neg Hx   . Mental illness Neg Hx   . Mental retardation Neg Hx   . Miscarriages / Stillbirths Neg Hx   . Stroke Neg  Hx   . Vision loss Neg Hx   . Seizures Neg Hx    Family Psychiatric History: None reported Social History:  Social History   Substance and Sexual Activity  Alcohol Use No     Social History   Substance and Sexual Activity  Drug Use No    Social History   Socioeconomic History  . Marital status: Married    Spouse name: Not on file  . Number of children: 3  . Years of education: Not on file  . Highest education level: Bachelor's degree (e.g., BA, AB, BS)  Occupational History  . Not on file  Tobacco Use  . Smoking status: Never Smoker  . Smokeless tobacco: Never Used  Vaping Use  . Vaping Use: Never used  Substance and Sexual Activity  . Alcohol use: No  . Drug use: No  . Sexual activity: Yes    Birth control/protection: None  Other Topics Concern  . Not on file  Social History Narrative   Lives at home with her husband and children   Right handed   Caffeine: tea, >10 cups daily   Social Determinants of Health   Financial Resource Strain: Not on file  Food Insecurity: Not on file  Transportation Needs: Not on file  Physical Activity: Not on file  Stress: Not on file  Social Connections: Not on file   SDOH:  SDOH Screenings   Alcohol Screen: Not on file  Depression (PHQ2-9): Medium Risk  . PHQ-2 Score: 14  Financial Resource Strain: Not on file  Food Insecurity: Not on file  Housing: Not on file  Physical Activity: Not on file  Social Connections: Not on file  Stress: Not on file  Tobacco Use: Not on file  Transportation Needs: Not on file    Has this patient used any form of tobacco in the last 30 days? (Cigarettes, Smokeless Tobacco, Cigars, and/or Pipes) A prescription for an FDA-approved tobacco cessation medication was offered at discharge and the patient refused  Current Medications:  Current Facility-Administered Medications  Medication Dose Route Frequency Provider Last Rate Last Admin  . acetaminophen (TYLENOL) tablet 650 mg  650 mg Oral  Q6H PRN Ajibola, Ene A, NP      . alum & mag hydroxide-simeth (MAALOX/MYLANTA) 200-200-20 MG/5ML suspension 30 mL  30 mL Oral Q4H PRN Ajibola, Ene A, NP      . hydrOXYzine (ATARAX/VISTARIL) tablet 25 mg  25 mg Oral TID PRN Ajibola, Ene A, NP      . magnesium hydroxide (MILK OF MAGNESIA) suspension 30 mL  30 mL Oral Daily PRN Ajibola, Ene A, NP       Current Outpatient Medications  Medication Sig Dispense Refill  . levothyroxine (SYNTHROID) 100 MCG tablet Take 100 mcg by mouth daily.  PTA Medications: (Not in a hospital admission)   Musculoskeletal  Strength & Muscle Tone: within normal limits Gait & Station: normal Patient leans: N/A  Psychiatric Specialty Exam  Presentation  General Appearance: Appropriate for Environment; Casual  Eye Contact:Good  Speech:Clear and Coherent; Normal Rate  Speech Volume:Normal  Handedness:Right   Mood and Affect  Mood:Euthymic  Affect:Appropriate; Congruent   Thought Process  Thought Processes:Coherent  Descriptions of Associations:Intact  Orientation:Full (Time, Place and Person)  Thought Content:WDL  Diagnosis of Schizophrenia or Schizoaffective disorder in past: No    Hallucinations:Hallucinations: None  Ideas of Reference:None  Suicidal Thoughts:Suicidal Thoughts: No  Homicidal Thoughts:Homicidal Thoughts: No   Sensorium  Memory:Immediate Good; Recent Good; Remote Good  Judgment:Good  Insight:Good   Executive Functions  Concentration:Good  Attention Span:Good  Recall:Good  Fund of Knowledge:Good  Language:Good   Psychomotor Activity  Psychomotor Activity:Psychomotor Activity: Normal   Assets  Assets:Communication Skills; Desire for Improvement; Financial Resources/Insurance; Housing; Physical Health; Social Support; Transportation   Sleep  Sleep:Sleep: Good   Nutritional Assessment (For OBS and FBC admissions only) Has the patient had a weight loss or gain of 10 pounds or more in the last  3 months?: No Has the patient had a decrease in food intake/or appetite?: No Does the patient have dental problems?: No Does the patient have eating habits or behaviors that may be indicators of an eating disorder including binging or inducing vomiting?: No Has the patient recently lost weight without trying?: No Has the patient been eating poorly because of a decreased appetite?: No Malnutrition Screening Tool Score: 0    Physical Exam  Physical Exam Vitals and nursing note reviewed.  Constitutional:      Appearance: She is well-developed.  HENT:     Head: Normocephalic.  Eyes:     Pupils: Pupils are equal, round, and reactive to light.  Cardiovascular:     Rate and Rhythm: Normal rate.  Pulmonary:     Effort: Pulmonary effort is normal.  Musculoskeletal:        General: Normal range of motion.  Neurological:     Mental Status: She is alert and oriented to person, place, and time.    Review of Systems  Constitutional: Negative.   HENT: Negative.   Eyes: Negative.   Respiratory: Negative.   Cardiovascular: Negative.   Gastrointestinal: Negative.   Genitourinary: Negative.   Musculoskeletal: Negative.   Skin: Negative.   Neurological: Negative.   Endo/Heme/Allergies: Negative.   Psychiatric/Behavioral: Negative.    Blood pressure 115/77, pulse 68, temperature 98.6 F (37 C), temperature source Oral, resp. rate 18, SpO2 100 %, currently breastfeeding. There is no height or weight on file to calculate BMI.  Demographic Factors:  NA  Loss Factors: NA  Historical Factors: NA  Risk Reduction Factors:   Responsible for children under 88 years of age, Sense of responsibility to family, Living with another person, especially a relative, Positive social support and Positive therapeutic relationship  Continued Clinical Symptoms:  None  Cognitive Features That Contribute To Risk:  None    Suicide Risk:  Minimal: No identifiable suicidal ideation.  Patients  presenting with no risk factors but with morbid ruminations; may be classified as minimal risk based on the severity of the depressive symptoms  Plan Of Care/Follow-up recommendations:  Continue activity as tolerated. Continue diet as recommended by your PCP. Ensure to keep all appointments with outpatient providers.  Disposition: Discharge home with husband  Maryfrances Bunnell, FNP 01/02/2021, 10:26 AM

## 2021-01-06 ENCOUNTER — Telehealth (HOSPITAL_COMMUNITY): Payer: Self-pay | Admitting: Physician Assistant

## 2021-01-06 NOTE — BH Assessment (Signed)
Care Management - Follow Up [facility] Discharges   Writer made contact with the patient.  Patient reports that she will be receiving her psychiatric medication from her established PCP.

## 2022-08-01 ENCOUNTER — Other Ambulatory Visit: Payer: Self-pay

## 2022-08-01 ENCOUNTER — Encounter (HOSPITAL_BASED_OUTPATIENT_CLINIC_OR_DEPARTMENT_OTHER): Payer: Self-pay

## 2022-08-01 ENCOUNTER — Emergency Department (HOSPITAL_BASED_OUTPATIENT_CLINIC_OR_DEPARTMENT_OTHER)
Admission: EM | Admit: 2022-08-01 | Discharge: 2022-08-01 | Disposition: A | Discharge status: Home / Self Care | Attending: Emergency Medicine | Admitting: Emergency Medicine

## 2022-08-01 DIAGNOSIS — Z1152 Encounter for screening for COVID-19: Secondary | ICD-10-CM | POA: Insufficient documentation

## 2022-08-01 DIAGNOSIS — J101 Influenza due to other identified influenza virus with other respiratory manifestations: Secondary | ICD-10-CM | POA: Diagnosis not present

## 2022-08-01 DIAGNOSIS — R059 Cough, unspecified: Secondary | ICD-10-CM | POA: Diagnosis present

## 2022-08-01 LAB — RESP PANEL BY RT-PCR (RSV, FLU A&B, COVID)  RVPGX2
Influenza A by PCR: POSITIVE — AB
Influenza B by PCR: NEGATIVE
Resp Syncytial Virus by PCR: NEGATIVE
SARS Coronavirus 2 by RT PCR: NEGATIVE

## 2022-08-01 LAB — GROUP A STREP BY PCR: Group A Strep by PCR: NOT DETECTED

## 2022-08-01 MED ORDER — BENZONATATE 100 MG PO CAPS: 100.0000 mg | ORAL_CAPSULE | Freq: Three times a day (TID) | ORAL | 0 refills | Status: AC

## 2022-08-01 MED ORDER — OSELTAMIVIR PHOSPHATE 75 MG PO CAPS: 75.0000 mg | ORAL_CAPSULE | Freq: Two times a day (BID) | ORAL | 0 refills | Status: AC

## 2022-08-01 NOTE — ED Triage Notes (Signed)
Patient here POV from Home.  Endorses Cough, Fever, Body Aches that began Yesterday.  NAD Noted during Triage. A&Ox4. GCS 15. Ambulatory.

## 2022-08-01 NOTE — ED Provider Notes (Signed)
[facility] EMERGENCY DEPT Provider Note   CSN: [id number] Arrival date & time: 08/01/22  1048     History  Chief Complaint  Patient presents with   Cough    Kristine [name] is a 41 y.o. female who presents to the ED with concern for cough onset yesterday.  Denies sick contacts.  Has associated fever, generalized bodyaches, rhinorrhea, nasal congestion, sore throat.  Has tried over-the-counter medications for her symptoms.  Denies trouble swallowing or trouble breathing.  The history is provided by the patient. No language interpreter was used.       Home Medications Prior to Admission medications   Medication Sig Start Date End Date Taking? Authorizing Provider  benzonatate (TESSALON) 100 MG capsule Take 1 capsule (100 mg total) by mouth every 8 (eight) hours. 08/01/22  Yes Jamilett Ferrante A, PA-C  oseltamivir (TAMIFLU) 75 MG capsule Take 1 capsule (75 mg total) by mouth every 12 (twelve) hours. 08/01/22  Yes Adabelle Griffiths A, PA-C  levothyroxine (SYNTHROID) 100 MCG tablet Take 100 mcg by mouth daily. 07/29/20   [provider]  iron polysaccharides (NIFEREX) 150 MG capsule Take 1 capsule (150 mg total) by mouth daily. Patient not taking: Reported on 07/02/2018 03/30/18 01/02/21  Gunnar Bulla, CNM      Allergies    Patient has no known allergies.    Review of Systems   Review of Systems  Respiratory:  Positive for cough.   All other systems reviewed and are negative.   Physical Exam Updated Vital Signs BP 117/77 (BP Location: Right Arm)   Pulse 96   Temp 98.9 F (37.2 C) (Oral)   Resp 20   Ht 5\' 3"  (1.6 m)   Wt 68 kg   SpO2 100%   BMI 26.56 kg/m  Physical Exam Vitals and nursing note reviewed.  Constitutional:      General: She is not in acute distress.    Appearance: She is not diaphoretic.  HENT:     Head: Normocephalic and atraumatic.     Mouth/Throat:     Mouth: Mucous membranes are moist.     Pharynx: Oropharynx is  clear. Uvula midline. No oropharyngeal exudate or posterior oropharyngeal erythema.     Tonsils: No tonsillar exudate.     Comments: Uvula midline without swelling. No posterior pharyngeal erythema or tonsillar exudate noted. Patent airway. Pt able to speak in clear complete sentences. Tolerating oral secretions. Eyes:     General: No scleral icterus.    Conjunctiva/sclera: Conjunctivae normal.  Cardiovascular:     Rate and Rhythm: Normal rate and regular rhythm.     Pulses: Normal pulses.     Heart sounds: Normal heart sounds.  Pulmonary:     Effort: Pulmonary effort is normal. No respiratory distress.     Breath sounds: Normal breath sounds. No wheezing.  Abdominal:     General: Bowel sounds are normal.     Palpations: Abdomen is soft. There is no mass.     Tenderness: There is no abdominal tenderness. There is no guarding or rebound.  Musculoskeletal:        General: Normal range of motion.     Cervical back: Normal range of motion and neck supple.  Skin:    General: Skin is warm and dry.  Neurological:     Mental Status: She is alert.  Psychiatric:        Behavior: Behavior normal.     ED Results / Procedures / Treatments   Labs (all  labs ordered are listed, but only abnormal results are displayed) Labs Reviewed  RESP PANEL BY RT-PCR (RSV, FLU A&B, COVID)  RVPGX2 - Abnormal; Notable for the following components:      Result Value   Influenza A by PCR POSITIVE (*)    All other components within normal limits  GROUP A STREP BY PCR    EKG None  Radiology No results found.  Procedures Procedures    Medications Ordered in ED Medications - No data to display  ED Course/ Medical Decision Making/ A&P Clinical Course as of 08/01/22 1210  Wed Aug 01, 2022  1204 Influenza A By PCR(!): POSITIVE [SB]    Clinical Course User Index [SB] Lita Flynn A, PA-C                           Medical Decision Making  Pt presents with cough, fever, generalized bodyaches  onset yesterday.  No sick contacts.  Tried over-the-counter medications. Vital signs, patient afebrile, not tachycardic or hypoxic. On exam, pt with uvula midline without swelling.  No posterior pharyngeal erythema or tonsillar exudate noted.  Patent airway.  Patient able to speak in clear complete sentences and tolerating oral secretions. No acute cardiovascular, respiratory exam findings. Differential diagnosis includes COVID, flu, viral URI with cough, RSV, strep pharyngitis, viral pharyngitis.   Labs:  I ordered, and personally interpreted labs.  The pertinent results include:   Negative COVID, RSV, strep swab. Flu swab positive for flu A   Disposition: Presentation suspicious for influenza A as cause of patient's symptoms.  Doubt COVID, RSV, strep pharyngitis. Discussed with patient regarding use of Tamiflu and discussed thoroughly on its risks and benefits. Follow discussion, patient opted for treatment with Tamiflu at this time.  Work note provided.  After consideration of the diagnostic results and the patients response to treatment, I feel that the patient would benefit from Discharge home.  Prescription for Tamiflu and Tessalon Perles sent to patient pharmacy.  Supportive care measures and strict return precautions discussed with patient at bedside. Pt acknowledges and verbalizes understanding. Pt appears safe for discharge. Follow up as indicated in discharge paperwork.    This chart was dictated using voice recognition software, Dragon. Despite the best efforts of this provider to proofread and correct errors, errors may still occur which can change documentation meaning.   Final Clinical Impression(s) / ED Diagnoses Final diagnoses:  Influenza A    Rx / DC Orders ED Discharge Orders          Ordered    benzonatate (TESSALON) 100 MG capsule  Every 8 hours        08/01/22 1209    oseltamivir (TAMIFLU) 75 MG capsule  Every 12 hours        08/01/22 1209               Tattiana Fakhouri A, PA-C 08/01/22 1212    Lonell Grandchild, MD 08/01/22 1326

## 2022-08-01 NOTE — Discharge Instructions (Addendum)
It was a pleasure taking care of you today!   Your COVID and RSV swabs were negative today. Your flu swab was positive for Flu A.  You will be sent a prescription for Tamiflu as well as Tessalon Perles, take as directed.  You may continue with over the counter cough and cold medications. Ensure to maintain fluid intake. Follow up with your primary care provider regarding todays ED visit. Ensure that you are wearing your mask and practicing good hand hygiene. Return to the ED if you are experiencing increasing/worsening symptoms.  

## 2022-10-02 DIAGNOSIS — Z1330 Encounter for screening examination for mental health and behavioral disorders, unspecified: Secondary | ICD-10-CM | POA: Diagnosis not present

## 2022-10-02 DIAGNOSIS — K6289 Other specified diseases of anus and rectum: Secondary | ICD-10-CM | POA: Diagnosis not present

## 2022-11-06 DIAGNOSIS — E782 Mixed hyperlipidemia: Secondary | ICD-10-CM | POA: Diagnosis not present

## 2022-11-06 DIAGNOSIS — R6889 Other general symptoms and signs: Secondary | ICD-10-CM | POA: Diagnosis not present

## 2022-11-06 DIAGNOSIS — D508 Other iron deficiency anemias: Secondary | ICD-10-CM | POA: Diagnosis not present

## 2022-11-06 DIAGNOSIS — E559 Vitamin D deficiency, unspecified: Secondary | ICD-10-CM | POA: Diagnosis not present

## 2022-11-06 DIAGNOSIS — K219 Gastro-esophageal reflux disease without esophagitis: Secondary | ICD-10-CM | POA: Diagnosis not present

## 2022-11-06 DIAGNOSIS — R1310 Dysphagia, unspecified: Secondary | ICD-10-CM | POA: Diagnosis not present

## 2022-11-06 DIAGNOSIS — K649 Unspecified hemorrhoids: Secondary | ICD-10-CM | POA: Diagnosis not present

## 2022-11-06 DIAGNOSIS — R1013 Epigastric pain: Secondary | ICD-10-CM | POA: Diagnosis not present

## 2022-11-19 DIAGNOSIS — R0789 Other chest pain: Secondary | ICD-10-CM | POA: Diagnosis not present

## 2022-11-19 DIAGNOSIS — R001 Bradycardia, unspecified: Secondary | ICD-10-CM | POA: Diagnosis not present

## 2022-11-19 DIAGNOSIS — R030 Elevated blood-pressure reading, without diagnosis of hypertension: Secondary | ICD-10-CM | POA: Diagnosis not present

## 2022-11-19 DIAGNOSIS — Z87898 Personal history of other specified conditions: Secondary | ICD-10-CM | POA: Diagnosis not present

## 2022-11-19 DIAGNOSIS — R42 Dizziness and giddiness: Secondary | ICD-10-CM | POA: Diagnosis not present

## 2022-11-21 DIAGNOSIS — K644 Residual hemorrhoidal skin tags: Secondary | ICD-10-CM | POA: Diagnosis not present

## 2022-12-26 ENCOUNTER — Encounter (HOSPITAL_COMMUNITY): Payer: Self-pay

## 2022-12-26 ENCOUNTER — Emergency Department (HOSPITAL_COMMUNITY)
Admission: EM | Admit: 2022-12-26 | Discharge: 2022-12-27 | Disposition: A | Discharge status: Home / Self Care | Attending: Student | Admitting: Student

## 2022-12-26 ENCOUNTER — Other Ambulatory Visit: Payer: Self-pay

## 2022-12-26 DIAGNOSIS — D72829 Elevated white blood cell count, unspecified: Secondary | ICD-10-CM | POA: Insufficient documentation

## 2022-12-26 DIAGNOSIS — R319 Hematuria, unspecified: Secondary | ICD-10-CM | POA: Diagnosis not present

## 2022-12-26 DIAGNOSIS — R300 Dysuria: Secondary | ICD-10-CM | POA: Insufficient documentation

## 2022-12-26 DIAGNOSIS — N898 Other specified noninflammatory disorders of vagina: Secondary | ICD-10-CM | POA: Insufficient documentation

## 2022-12-26 NOTE — ED Triage Notes (Signed)
Pt arrives with c/o hematuria and dysuria that started a couple of days ago. Pt also endorse lower ABD pain and bilateral flank pain. Pt denies fevers.

## 2022-12-27 ENCOUNTER — Telehealth (HOSPITAL_COMMUNITY): Payer: Self-pay | Admitting: Student

## 2022-12-27 ENCOUNTER — Emergency Department (HOSPITAL_COMMUNITY)

## 2022-12-27 LAB — COMPREHENSIVE METABOLIC PANEL
ALT: 18 U/L (ref 0–44)
AST: 16 U/L (ref 15–41)
Albumin: 3.7 g/dL (ref 3.5–5.0)
Alkaline Phosphatase: 86 U/L (ref 38–126)
Anion gap: 8 (ref 5–15)
BUN: 10 mg/dL (ref 6–20)
CO2: 24 mmol/L (ref 22–32)
Calcium: 9.2 mg/dL (ref 8.9–10.3)
Chloride: 103 mmol/L (ref 98–111)
Creatinine, Ser: 0.63 mg/dL (ref 0.44–1.00)
GFR, Estimated: >60 mL/min (ref >60)
Glucose, Bld: 101 mg/dL — ABNORMAL HIGH (ref 70–99)
Potassium: 3.5 mmol/L (ref 3.5–5.1)
Sodium: 135 mmol/L (ref 135–145)
Total Bilirubin: 0.5 mg/dL (ref 0.3–1.2)
Total Protein: 6.8 g/dL (ref 6.5–8.1)

## 2022-12-27 LAB — URINALYSIS, ROUTINE W REFLEX MICROSCOPIC
Bilirubin Urine: NEGATIVE
Glucose, UA: NEGATIVE mg/dL
Ketones, ur: NEGATIVE mg/dL
Nitrite: NEGATIVE
Protein, ur: >300 mg/dL — AB
Specific Gravity, Urine: 1.012 (ref 1.005–1.030)
pH: 7 (ref 5.0–8.0)

## 2022-12-27 LAB — WET PREP, GENITAL
Clue Cells Wet Prep HPF POC: NONE SEEN
Sperm: NONE SEEN
Trich, Wet Prep: NONE SEEN
WBC, Wet Prep HPF POC: <10 (ref <10)
Yeast Wet Prep HPF POC: NONE SEEN

## 2022-12-27 LAB — LIPASE, BLOOD: Lipase: 26 U/L (ref 11–51)

## 2022-12-27 LAB — CBC
HCT: 35.3 % — ABNORMAL LOW (ref 36.0–46.0)
Hemoglobin: 11.7 g/dL — ABNORMAL LOW (ref 12.0–15.0)
MCH: 28.8 pg (ref 26.0–34.0)
MCHC: 33.1 g/dL (ref 30.0–36.0)
MCV: 86.9 fL (ref 80.0–100.0)
Platelets: 307 10*3/uL (ref 150–400)
RBC: 4.06 MIL/uL (ref 3.87–5.11)
RDW: 13.3 % (ref 11.5–15.5)
WBC: 10.8 10*3/uL — ABNORMAL HIGH (ref 4.0–10.5)
nRBC: 0 % (ref 0.0–0.2)

## 2022-12-27 LAB — URINALYSIS, MICROSCOPIC (REFLEX)
Bacteria, UA: NONE SEEN
RBC / HPF: >50 RBC/hpf (ref 0–5)
WBC, UA: >50 WBC/hpf (ref 0–5)

## 2022-12-27 LAB — PREGNANCY, URINE: Preg Test, Ur: NEGATIVE

## 2022-12-27 LAB — I-STAT BETA HCG BLOOD, ED (MC, WL, AP ONLY): I-stat hCG, quantitative: 32.9 m[IU]/mL — ABNORMAL HIGH (ref <5)

## 2022-12-27 LAB — HCG, QUANTITATIVE, PREGNANCY: hCG, Beta Chain, Quant, S: <1 m[IU]/mL (ref <5)

## 2022-12-27 MED ORDER — MORPHINE SULFATE (PF) 4 MG/ML IV SOLN
4.0000 mg | Freq: Once | INTRAVENOUS | Status: AC
  Administered 2022-12-27: 4 mg via INTRAVENOUS
  Filled 2022-12-27: qty 1

## 2022-12-27 MED ORDER — CEFTRIAXONE SODIUM 500 MG IJ SOLR
500.0000 mg | Freq: Once | INTRAMUSCULAR | Status: AC
  Administered 2022-12-27: 500 mg via INTRAMUSCULAR
  Filled 2022-12-27: qty 500

## 2022-12-27 MED ORDER — CEFADROXIL 500 MG PO CAPS: 500.0000 mg | ORAL_CAPSULE | Freq: Two times a day (BID) | ORAL | Status: DC

## 2022-12-27 MED ORDER — CEFADROXIL 500 MG PO CAPS: 500.0000 mg | ORAL_CAPSULE | Freq: Two times a day (BID) | ORAL | 0 refills | Status: AC

## 2022-12-27 MED ORDER — NAPROXEN 375 MG PO TABS: 375.0000 mg | ORAL_TABLET | Freq: Two times a day (BID) | ORAL | 0 refills | Status: AC

## 2022-12-27 MED ORDER — DOXYCYCLINE HYCLATE 100 MG PO CAPS: 100.0000 mg | ORAL_CAPSULE | Freq: Two times a day (BID) | ORAL | 0 refills | Status: DC

## 2022-12-27 MED ORDER — LACTATED RINGERS IV BOLUS
1000.0000 mL | Freq: Once | INTRAVENOUS | Status: AC
  Administered 2022-12-27: 1000 mL via INTRAVENOUS

## 2022-12-27 MED ORDER — LIDOCAINE HCL (PF) 1 % IJ SOLN
1.0000 mL | Freq: Once | INTRAMUSCULAR | Status: AC
  Administered 2022-12-27: 1 mL
  Filled 2022-12-27: qty 5

## 2022-12-27 MED ORDER — KETOROLAC TROMETHAMINE 15 MG/ML IJ SOLN
15.0000 mg | Freq: Once | INTRAMUSCULAR | Status: AC
  Administered 2022-12-27: 15 mg via INTRAVENOUS
  Filled 2022-12-27: qty 1

## 2022-12-27 MED ORDER — FLUCONAZOLE 150 MG PO TABS: 150.0000 mg | ORAL_TABLET | Freq: Once | ORAL | 0 refills | Status: AC | PRN

## 2022-12-27 MED ORDER — SODIUM CHLORIDE 0.9 % IV SOLN: 2.0000 g | Freq: Once | INTRAVENOUS | Status: DC

## 2022-12-27 NOTE — ED Provider Notes (Signed)
[place] EMERGENCY DEPARTMENT AT [facility] Provider Note  CSN: [id number] Arrival date & time: 12/26/22 2338  Chief Complaint(s) Hematuria and Dysuria  HPI Kristine [name] is a 42 y.o. female with PMH iron deficiency anemia who presents emergency department for evaluation of hematuria and dysuria.  She states that symptoms have been present for the last 2 to 3 days and have progressed to involve the suprapubic region and up into bilateral flanks.  She denies fever, chest pain, shortness of breath, headache or other systemic symptoms.  She does endorse some white vaginal discharge that she has not seen before.  She states that she does get intermittent yeast infections but this discharge is not of the same consistency as this is thinner than usual.   Past Medical History Past Medical History:  Diagnosis Date   Acute blood loss anemia 02/21/2013   Anemia, iron deficiency 02/21/2013   Cholestasis during pregnancy in second trimester 01/28/2018   Constipation, chronic 12/12/2012   Depression    Previous cesarean delivery, antepartum condition or complication 11/22/2016   Vitamin D deficiency, unspecified    Patient Active Problem List   Diagnosis Date Noted   Syncope 03/28/2018   PP C/S (8/16) 03/28/2018   Cholestasis during pregnancy in second trimester 01/28/2018   MDD (major depressive disorder), recurrent episode, moderate (HCC) 10/30/2017   Perinatal depression in first trimester 10/01/2017   Previous cesarean delivery, antepartum condition or complication 11/22/2016   Cesarean delivery delivered 11/22/2016   Postpartum care following cesarean delivery (4/12) 11/22/2016   Hemorrhoids, external, thrombosed s/p excision 12/12/2012 12/12/2012   Constipation, chronic 12/12/2012   Home Medication(s) Prior to Admission medications   Medication Sig Start Date End Date Taking? Authorizing Provider  benzonatate (TESSALON) 100 MG capsule Take 1 capsule (100 mg total) by mouth  every 8 (eight) hours. 08/01/22   Blue, Soijett A, PA-C  levothyroxine (SYNTHROID) 100 MCG tablet Take 100 mcg by mouth daily. 07/29/20   [provider]  oseltamivir (TAMIFLU) 75 MG capsule Take 1 capsule (75 mg total) by mouth every 12 (twelve) hours. 08/01/22   Blue, Soijett A, PA-C  iron polysaccharides (NIFEREX) 150 MG capsule Take 1 capsule (150 mg total) by mouth daily. Patient not taking: Reported on 07/02/2018 03/30/18 01/02/21  Gunnar Bulla, CNM                                                                                                                                    Past Surgical History Past Surgical History:  Procedure Laterality Date   CESAREAN SECTION N/A 02/20/2013   Procedure: CESAREAN SECTION;  Surgeon: Robley Fries, MD;  Location: [facility];  Service: Obstetrics;  Laterality: N/A;   CESAREAN SECTION N/A 11/22/2016   Procedure: REPEAT CESAREAN SECTION;  Surgeon: Shea Evans, MD;  Location: [facility] BIRTHING SUITES;  Service: Obstetrics;  Laterality: N/A;  EDD: 11/29/16   CESAREAN SECTION N/A 03/28/2018  Procedure: Repeat CESAREAN SECTION;  Surgeon: Shea Evans, MD;  Location: [facility] BIRTHING SUITES;  Service: Obstetrics;  Laterality: N/A;  EDD: 04/16/18   Family History Family History  Problem Relation Age of Onset   Hypertension Mother    Cancer Mother        uterus   Hypertension Paternal Uncle    Hypothyroidism Sister    Other Neg Hx    Hearing loss Neg Hx    Alcohol abuse Neg Hx    Arthritis Neg Hx    Asthma Neg Hx    Birth defects Neg Hx    COPD Neg Hx    Depression Neg Hx    Diabetes Neg Hx    Drug abuse Neg Hx    Early death Neg Hx    Heart disease Neg Hx    Hyperlipidemia Neg Hx    Kidney disease Neg Hx    Learning disabilities Neg Hx    Mental illness Neg Hx    Mental retardation Neg Hx    Miscarriages / Stillbirths Neg Hx    Stroke Neg Hx    Vision loss Neg Hx    Seizures Neg Hx     Social History Social History   Tobacco  Use   Smoking status: Never   Smokeless tobacco: Never  Vaping Use   Vaping Use: Never used  Substance Use Topics   Alcohol use: No   Drug use: No   Allergies Patient has no known allergies.  Review of Systems Review of Systems  Genitourinary:  Positive for dysuria, flank pain, pelvic pain and vaginal discharge.    Physical Exam Vital Signs  I have reviewed the triage vital signs BP (!) 145/89 (BP Location: Right Arm)   Pulse 79   Temp 97.9 F (36.6 C) (Oral)   Resp 20   Wt 68 kg   SpO2 97%   BMI 26.57 kg/m   Physical Exam Vitals and nursing note reviewed.  Constitutional:      General: She is not in acute distress.    Appearance: She is well-developed.  HENT:     Head: Normocephalic and atraumatic.  Eyes:     Conjunctiva/sclera: Conjunctivae normal.  Cardiovascular:     Rate and Rhythm: Normal rate and regular rhythm.     Heart sounds: No murmur heard. Pulmonary:     Effort: Pulmonary effort is normal. No respiratory distress.  Abdominal:     Tenderness: There is abdominal tenderness. There is right CVA tenderness and left CVA tenderness.  Musculoskeletal:        General: No swelling.     Cervical back: Neck supple.  Skin:    General: Skin is warm and dry.     Capillary Refill: Capillary refill takes less than 2 seconds.  Neurological:     Mental Status: She is alert.  Psychiatric:        Mood and Affect: Mood normal.     ED Results and Treatments Labs (all labs ordered are listed, but only abnormal results are displayed) Labs Reviewed  COMPREHENSIVE METABOLIC PANEL - Abnormal; Notable for the following components:      Result Value   Glucose, Bld 101 (*)    All other components within normal limits  CBC - Abnormal; Notable for the following components:   WBC 10.8 (*)    Hemoglobin 11.7 (*)    HCT 35.3 (*)    All other components within normal limits  URINALYSIS, ROUTINE W REFLEX MICROSCOPIC - Abnormal; Notable  for the following components:    Color, Urine RED (*)    APPearance CLOUDY (*)    Hgb urine dipstick LARGE (*)    Protein, ur >300 (*)    Leukocytes,Ua MODERATE (*)    All other components within normal limits  I-STAT BETA HCG BLOOD, ED ([facility], [facility], [facility] ONLY) - Abnormal; Notable for the following components:   I-stat hCG, quantitative 32.9 (*)    All other components within normal limits  LIPASE, BLOOD  URINALYSIS, MICROSCOPIC (REFLEX)  PREGNANCY, URINE  HCG, QUANTITATIVE, PREGNANCY                                                                                                                          Radiology CT Renal Stone Study  Result Date: 12/27/2022 CLINICAL DATA:  Abdominal/flank pain, stone suspected EXAM: CT ABDOMEN AND PELVIS WITHOUT CONTRAST TECHNIQUE: Multidetector CT imaging of the abdomen and pelvis was performed following the standard protocol without IV contrast. RADIATION DOSE REDUCTION: This exam was performed according to the departmental dose-optimization program which includes automated exposure control, adjustment of the mA and/or kV according to patient size and/or use of iterative reconstruction technique. COMPARISON:  None Available. FINDINGS: Lower chest: No acute abnormality Hepatobiliary: No focal hepatic abnormality. Gallbladder unremarkable. Pancreas: No focal abnormality or ductal dilatation. Spleen: No focal abnormality.  Normal size. Adrenals/Urinary Tract: No adrenal abnormality. No focal renal abnormality. No stones or hydronephrosis. Urinary bladder is unremarkable. Stomach/Bowel: Stomach, large and small bowel grossly unremarkable. Normal appendix. Vascular/Lymphatic: No evidence of aneurysm or adenopathy. Reproductive: Uterus and adnexa unremarkable. No mass. IUD in the uterus. Other: No free fluid or free air. Musculoskeletal: No acute bony abnormality. IMPRESSION: No acute findings in the abdomen or pelvis. Electronically Signed   By: Charlett Nose M.D.   On: 12/27/2022 01:32    Pertinent labs  & imaging results that were available during my care of the patient were reviewed by me and considered in my medical decision making (see MDM for details).  Medications Ordered in ED Medications  morphine (PF) 4 MG/ML injection 4 mg (has no administration in time range)  ketorolac (TORADOL) 15 MG/ML injection 15 mg (has no administration in time range)  lactated ringers bolus 1,000 mL (has no administration in time range)  Procedures Procedures  (including critical care time)  Medical Decision Making / ED Course   This patient presents to the ED for concern of hematuria, dysuria, abdominal pain, this involves an extensive number of treatment options, and is a complaint that carries with it a high risk of complications and morbidity.  The differential diagnosis includes UTI, pyelonephritis, STI, yeast infection, nephrolithiasis  MDM: Patient seen emergency room for evaluation of multiple complaints described above.  Physical exam with suprapubic tenderness and bilateral CVA tenderness.  Pelvic exam with copious white discharge in vaginal canal.  Laboratory evaluation with a mild leukocytosis to 10.8, hemoglobin 11.7, urinalysis with moderate leuk esterase, greater than 50 red blood cells, greater than 50 white blood cells but no bacteria.  Urine sent for culture.  CT stone study unremarkable.  Wet prep negative.  Patient pain controlled in the emergency room with Toradol and morphine and on reevaluation symptoms have improved.  I spoke with the patient alone who has low suspicion for STDs, but we will cover for gonorrhea chlamydia with IM ceftriaxone today and send the appropriate swabs to the lab.  Will start the patient empirically on Duricef for UTI and peeled this medication off if urine culture is negative.  Patient then discharged with outpatient  follow-up.   Additional history obtained: -Additional history obtained from husband -External records from outside source obtained and reviewed including: Chart review including previous notes, labs, imaging, consultation notes   Lab Tests: -I ordered, reviewed, and interpreted labs.   The pertinent results include:   Labs Reviewed  COMPREHENSIVE METABOLIC PANEL - Abnormal; Notable for the following components:      Result Value   Glucose, Bld 101 (*)    All other components within normal limits  CBC - Abnormal; Notable for the following components:   WBC 10.8 (*)    Hemoglobin 11.7 (*)    HCT 35.3 (*)    All other components within normal limits  URINALYSIS, ROUTINE W REFLEX MICROSCOPIC - Abnormal; Notable for the following components:   Color, Urine RED (*)    APPearance CLOUDY (*)    Hgb urine dipstick LARGE (*)    Protein, ur >300 (*)    Leukocytes,Ua MODERATE (*)    All other components within normal limits  I-STAT BETA HCG BLOOD, ED ([facility], [facility], [facility] ONLY) - Abnormal; Notable for the following components:   I-stat hCG, quantitative 32.9 (*)    All other components within normal limits  LIPASE, BLOOD  URINALYSIS, MICROSCOPIC (REFLEX)  PREGNANCY, URINE  HCG, QUANTITATIVE, PREGNANCY     Imaging Studies ordered: I ordered imaging studies including CTAP I independently visualized and interpreted imaging. I agree with the radiologist interpretation   Medicines ordered and prescription drug management: Meds ordered this encounter  Medications   morphine (PF) 4 MG/ML injection 4 mg   ketorolac (TORADOL) 15 MG/ML injection 15 mg   lactated ringers bolus 1,000 mL    -I have reviewed the patients home medicines and have made adjustments as needed  Critical interventions none   Cardiac Monitoring: The patient was maintained on a cardiac monitor.  I personally viewed and interpreted the cardiac monitored which showed an underlying rhythm of: NSR  Social Determinants of  Health:  Factors impacting patients care include: none   Reevaluation: After the interventions noted above, I reevaluated the patient and found that they have :improved  Co morbidities that complicate the patient evaluation  Past Medical History:  Diagnosis Date   Acute blood loss  anemia 02/21/2013   Anemia, iron deficiency 02/21/2013   Cholestasis during pregnancy in second trimester 01/28/2018   Constipation, chronic 12/12/2012   Depression    Previous cesarean delivery, antepartum condition or complication 11/22/2016   Vitamin D deficiency, unspecified       Dispostion: I considered admission for this patient, but at this time she does not meet inpatient criteria for admission she is safe for discharge with outpatient follow-up     Final Clinical Impression(s) / ED Diagnoses Final diagnoses:  None     @PCDICTATION @    Glendora Score, MD 12/27/22 1636

## 2022-12-27 NOTE — Telephone Encounter (Signed)
I called the lab today and it appears that they never received the GC chlamydia swab.  Patient's antibiotic coverage will be expanded with doxycycline and I will send this to the pharmacy today.

## 2022-12-28 LAB — URINE CULTURE

## 2023-03-26 ENCOUNTER — Other Ambulatory Visit: Payer: Self-pay

## 2023-03-26 ENCOUNTER — Emergency Department (HOSPITAL_BASED_OUTPATIENT_CLINIC_OR_DEPARTMENT_OTHER)
Admission: EM | Admit: 2023-03-26 | Discharge: 2023-03-26 | Disposition: A | Discharge status: Home / Self Care | Attending: Emergency Medicine | Admitting: Emergency Medicine

## 2023-03-26 ENCOUNTER — Emergency Department (HOSPITAL_BASED_OUTPATIENT_CLINIC_OR_DEPARTMENT_OTHER): Admitting: Radiology

## 2023-03-26 ENCOUNTER — Emergency Department (HOSPITAL_BASED_OUTPATIENT_CLINIC_OR_DEPARTMENT_OTHER)

## 2023-03-26 DIAGNOSIS — R519 Headache, unspecified: Secondary | ICD-10-CM | POA: Diagnosis present

## 2023-03-26 DIAGNOSIS — R110 Nausea: Secondary | ICD-10-CM | POA: Diagnosis not present

## 2023-03-26 DIAGNOSIS — R079 Chest pain, unspecified: Secondary | ICD-10-CM | POA: Insufficient documentation

## 2023-03-26 DIAGNOSIS — H53149 Visual discomfort, unspecified: Secondary | ICD-10-CM | POA: Diagnosis not present

## 2023-03-26 LAB — CBC
HCT: 38.2 % (ref 36.0–46.0)
Hemoglobin: 13 g/dL (ref 12.0–15.0)
MCH: 29.6 pg (ref 26.0–34.0)
MCHC: 34 g/dL (ref 30.0–36.0)
MCV: 87 fL (ref 80.0–100.0)
Platelets: 351 10*3/uL (ref 150–400)
RBC: 4.39 MIL/uL (ref 3.87–5.11)
RDW: 13 % (ref 11.5–15.5)
WBC: 8.2 10*3/uL (ref 4.0–10.5)
nRBC: 0 % (ref 0.0–0.2)

## 2023-03-26 LAB — BASIC METABOLIC PANEL
Anion gap: 9 (ref 5–15)
BUN: 13 mg/dL (ref 6–20)
CO2: 27 mmol/L (ref 22–32)
Calcium: 10.3 mg/dL (ref 8.9–10.3)
Chloride: 103 mmol/L (ref 98–111)
Creatinine, Ser: 0.62 mg/dL (ref 0.44–1.00)
GFR, Estimated: >60 mL/min (ref >60)
Glucose, Bld: 98 mg/dL (ref 70–99)
Potassium: 4.2 mmol/L (ref 3.5–5.1)
Sodium: 139 mmol/L (ref 135–145)

## 2023-03-26 LAB — TROPONIN I (HIGH SENSITIVITY)
Troponin I (High Sensitivity): <2 ng/L (ref <18)
Troponin I (High Sensitivity): <2 ng/L (ref <18)

## 2023-03-26 LAB — PREGNANCY, URINE: Preg Test, Ur: NEGATIVE

## 2023-03-26 MED ORDER — KETOROLAC TROMETHAMINE 15 MG/ML IJ SOLN
15.0000 mg | Freq: Once | INTRAMUSCULAR | Status: AC
  Administered 2023-03-26: 15 mg via INTRAVENOUS
  Filled 2023-03-26: qty 1

## 2023-03-26 MED ORDER — METOCLOPRAMIDE HCL 5 MG/ML IJ SOLN
10.0000 mg | Freq: Once | INTRAMUSCULAR | Status: AC
  Administered 2023-03-26: 10 mg via INTRAVENOUS
  Filled 2023-03-26: qty 2

## 2023-03-26 MED ORDER — LACTATED RINGERS IV BOLUS
1000.0000 mL | Freq: Once | INTRAVENOUS | Status: AC
  Administered 2023-03-26: 1000 mL via INTRAVENOUS

## 2023-03-26 NOTE — ED Triage Notes (Signed)
HA since yesterday AM. Entire head. BP in wnl at home-120/73 HR 75. Advil and tylenol at home with no relief. Vision blurry both eyes-intermittent. +N/-V, upper chest pain. Denies numbness, weakness, confusion.  HX heart murmur. No regular medications.

## 2023-03-26 NOTE — ED Notes (Signed)
Pt returned from head ct.

## 2023-03-26 NOTE — ED Notes (Signed)
Pt being taken back for head ct

## 2023-03-26 NOTE — ED Provider Notes (Signed)
EMERGENCY DEPARTMENT AT [facility] Provider Note   CSN: [id number] Arrival date & time: 03/26/23  1555     History  Chief Complaint  Patient presents with   Headache   Chest Pain    Kristine [name] is a 42 y.o. female.  Patient is a 42 year old female with a history of anemia, cholestasis during pregnancy and depression who is presenting today with complaints of headache.  She reports that yesterday around 8:30 AM while she was at work she started developing a headache which is mostly in the front of her head but also will radiate to the top and back of her neck.  She reports photophobia with the headache but denied any significant visual changes, unilateral numbness, weakness, speech difficulty.  She took some Advil yesterday x 3 with minimal improvement in the headache and went to bed but when she woke up today the headache was still there and has worsened as the day has progressed.  She has had nausea without vomiting but also reports that she feels a heaviness in her chest in addition to the nausea.  She states it also makes her feel anxious.  She has a history of headaches but reports she has not had a headache this severe in the past.  She does not take any OCPs.  No known heart history.  No history of clots.  She is not on any medications at this time other than the over-the-counter medication she tried for the headache.  Last time she had anything was around 8:00 this morning and she had taken some Tylenol.  Currently headache is in the front of her forehead and she feels it radiating into the back of her neck.  No known sick exposures, fevers or tick exposures.  The history is provided by the patient and medical records.  Headache Chest Pain Associated symptoms: headache        Home Medications Prior to Admission medications   Medication Sig Start Date End Date Taking? Authorizing Provider  benzonatate (TESSALON) 100 MG capsule Take 1 capsule (100 mg  total) by mouth every 8 (eight) hours. 08/01/22   Blue, Soijett A, PA-C  doxycycline (VIBRAMYCIN) 100 MG capsule Take 1 capsule (100 mg total) by mouth 2 (two) times daily. 12/27/22   Kommor, Madison, MD  fluconazole (DIFLUCAN) 150 MG tablet Take 1 tablet (150 mg total) by mouth once as needed for up to 1 dose (if having yeast symptoms). 12/27/22   Kommor, Madison, MD  levothyroxine (SYNTHROID) 100 MCG tablet Take 100 mcg by mouth daily. 07/29/20   [provider]  naproxen (NAPROSYN) 375 MG tablet Take 1 tablet (375 mg total) by mouth 2 (two) times daily. 12/27/22   Kommor, Madison, MD  oseltamivir (TAMIFLU) 75 MG capsule Take 1 capsule (75 mg total) by mouth every 12 (twelve) hours. 08/01/22   Blue, Soijett A, PA-C  iron polysaccharides (NIFEREX) 150 MG capsule Take 1 capsule (150 mg total) by mouth daily. Patient not taking: Reported on 07/02/2018 03/30/18 01/02/21  Gunnar Bulla, CNM      Allergies    Patient has no known allergies.    Review of Systems   Review of Systems  Cardiovascular:  Positive for chest pain.  Neurological:  Positive for headaches.    Physical Exam Updated Vital Signs BP 125/88 (BP Location: Right Arm)   Pulse 63   Temp 97.8 F (36.6 C) (Oral)   Resp 16   Ht 5\' 2"  (1.575 m)  Wt 77.1 kg   SpO2 100%   BMI 31.09 kg/m  Physical Exam Vitals and nursing note reviewed.  Constitutional:      General: She is not in acute distress.    Appearance: She is well-developed.  HENT:     Head: Normocephalic and atraumatic.  Eyes:     Pupils: Pupils are equal, round, and reactive to light.     Funduscopic exam:    Right eye: No papilledema.        Left eye: No papilledema.  Neck:     Comments: Full flexion and extension of the neck without any pain Cardiovascular:     Rate and Rhythm: Normal rate and regular rhythm.     Heart sounds: Normal heart sounds. No murmur heard.    No friction rub.  Pulmonary:     Effort: Pulmonary effort is normal.      Breath sounds: Normal breath sounds. No wheezing or rales.  Abdominal:     General: Bowel sounds are normal. There is no distension.     Palpations: Abdomen is soft.     Tenderness: There is no abdominal tenderness. There is no guarding or rebound.  Musculoskeletal:        General: No tenderness. Normal range of motion.     Cervical back: Normal range of motion and neck supple.     Comments: No edema  Lymphadenopathy:     Cervical: No cervical adenopathy.  Skin:    General: Skin is warm and dry.     Findings: No rash.  Neurological:     Mental Status: She is alert and oriented to person, place, and time.     Cranial Nerves: No cranial nerve deficit.     Sensory: No sensory deficit.     Motor: No weakness.     Coordination: Coordination normal.     Gait: Gait normal.     Comments: photophobia  Psychiatric:        Mood and Affect: Mood normal.        Behavior: Behavior normal.     ED Results / Procedures / Treatments   Labs (all labs ordered are listed, but only abnormal results are displayed) Labs Reviewed  BASIC METABOLIC PANEL  CBC  PREGNANCY, URINE  TROPONIN I (HIGH SENSITIVITY)  TROPONIN I (HIGH SENSITIVITY)    EKG EKG Interpretation Date/Time:  Tuesday March 26 2023 16:47:46 EDT Ventricular Rate:  67 PR Interval:  148 QRS Duration:  74 QT Interval:  366 QTC Calculation: 386 R Axis:   43  Text Interpretation: Normal sinus rhythm Cannot rule out Anterior infarct , age undetermined When compared with ECG of 01-Jan-2021 13:36, No significant change was found Confirmed by Gwyneth Sprout ([id number]) on 03/26/2023 6:27:57 PM  Radiology CT Head Wo Contrast  Result Date: 03/26/2023 CLINICAL DATA:  Headaches for 2 days, initial encounter EXAM: CT HEAD WITHOUT CONTRAST TECHNIQUE: Contiguous axial images were obtained from the base of the skull through the vertex without intravenous contrast. RADIATION DOSE REDUCTION: This exam was performed according to the  departmental dose-optimization program which includes automated exposure control, adjustment of the mA and/or kV according to patient size and/or use of iterative reconstruction technique. COMPARISON:  03/28/2018 FINDINGS: Brain: No evidence of acute infarction, hemorrhage, hydrocephalus, extra-axial collection or mass lesion/mass effect. Vascular: No hyperdense vessel or unexpected calcification. Skull: Normal. Negative for fracture or focal lesion. Sinuses/Orbits: No acute finding. Other: None. IMPRESSION: No acute intracranial abnormality noted. Electronically Signed   By: Loraine Leriche  Lukens M.D.   On: 03/26/2023 19:03   DG Chest 2 View  Result Date: 03/26/2023 CLINICAL DATA:  Chest pain EXAM: CHEST - 2 VIEW COMPARISON:  07/02/2018 FINDINGS: Lungs are clear.  No pleural effusion or pneumothorax. The heart is normal in size. Visualized osseous structures are within normal limits. IMPRESSION: Normal chest radiographs. Electronically Signed   By: Charline Bills M.D.   On: 03/26/2023 17:46    Procedures Procedures    Medications Ordered in ED Medications  ketorolac (TORADOL) 15 MG/ML injection 15 mg (15 mg Intravenous Given 03/26/23 1902)  metoCLOPramide (REGLAN) injection 10 mg (10 mg Intravenous Given 03/26/23 1902)  lactated ringers bolus 1,000 mL (0 mLs Intravenous Stopped 03/26/23 1952)    ED Course/ Medical Decision Making/ A&P                                 Medical Decision Making Amount and/or Complexity of Data Reviewed Labs: ordered. Decision-making details documented in ED Course. Radiology: ordered and independent interpretation performed. Decision-making details documented in ED Course. ECG/medicine tests: ordered and independent interpretation performed. Decision-making details documented in ED Course.  Risk Prescription drug management.   Pt with HA that seems most classic for migraine however states she has never had a HA this bad.  No sx suggestive of SAH(sudden onset, worst  of life, or deficits), infection, or cavernous vein thrombosis (no OCP's or hx of clot).  Normal neuro exam and vital signs.   I independently interpreted patient's labs and EKG.  EKG without acute findings today, CBC, troponin, BMP are all within normal limits.  Pregnancy test is negative.  I have independently visualized and interpreted pt's images today.  Chest x-ray without acute findings and head CT was negative.  Will give HA cocktail and on re-eval  pt feeling much better.  Will d/c home with return precautions..         Final Clinical Impression(s) / ED Diagnoses Final diagnoses:  Acute intractable headache, unspecified headache type    Rx / DC Orders ED Discharge Orders     None         Gwyneth Sprout, MD 03/27/23 0002

## 2023-03-26 NOTE — ED Notes (Signed)
Discharge paperwork given and verbally understood. 

## 2023-03-26 NOTE — Discharge Instructions (Addendum)
All the x-rays and blood work are normal.  Everything with your heart and brain look okay.  However if the headache does not resolve you should follow-up with your doctor by the end of the week.  If you start having any difficulty speaking one-sided weakness, numbness, difficulty walking you should return to the emergency room.  Try to stay hydrated and get plenty of rest.

## 2023-05-28 ENCOUNTER — Emergency Department (HOSPITAL_BASED_OUTPATIENT_CLINIC_OR_DEPARTMENT_OTHER): Admitting: Radiology

## 2023-05-28 ENCOUNTER — Emergency Department (HOSPITAL_BASED_OUTPATIENT_CLINIC_OR_DEPARTMENT_OTHER)
Admission: EM | Admit: 2023-05-28 | Discharge: 2023-05-28 | Disposition: A | Discharge status: Home / Self Care | Attending: Emergency Medicine | Admitting: Emergency Medicine

## 2023-05-28 ENCOUNTER — Encounter (HOSPITAL_BASED_OUTPATIENT_CLINIC_OR_DEPARTMENT_OTHER): Payer: Self-pay | Admitting: Emergency Medicine

## 2023-05-28 ENCOUNTER — Other Ambulatory Visit: Payer: Self-pay

## 2023-05-28 ENCOUNTER — Emergency Department (HOSPITAL_BASED_OUTPATIENT_CLINIC_OR_DEPARTMENT_OTHER)

## 2023-05-28 DIAGNOSIS — J189 Pneumonia, unspecified organism: Secondary | ICD-10-CM

## 2023-05-28 DIAGNOSIS — J181 Lobar pneumonia, unspecified organism: Secondary | ICD-10-CM | POA: Diagnosis not present

## 2023-05-28 DIAGNOSIS — Z1152 Encounter for screening for COVID-19: Secondary | ICD-10-CM | POA: Diagnosis not present

## 2023-05-28 DIAGNOSIS — R509 Fever, unspecified: Secondary | ICD-10-CM | POA: Diagnosis present

## 2023-05-28 LAB — URINALYSIS, ROUTINE W REFLEX MICROSCOPIC
Bilirubin Urine: NEGATIVE
Glucose, UA: NEGATIVE mg/dL
Ketones, ur: NEGATIVE mg/dL
Nitrite: NEGATIVE
RBC / HPF: >50 RBC/hpf (ref 0–5)
Specific Gravity, Urine: 1.014 (ref 1.005–1.030)
pH: 7 (ref 5.0–8.0)

## 2023-05-28 LAB — RESP PANEL BY RT-PCR (RSV, FLU A&B, COVID)  RVPGX2
Influenza A by PCR: NEGATIVE
Influenza B by PCR: NEGATIVE
Resp Syncytial Virus by PCR: NEGATIVE
SARS Coronavirus 2 by RT PCR: NEGATIVE

## 2023-05-28 LAB — PREGNANCY, URINE: Preg Test, Ur: NEGATIVE

## 2023-05-28 MED ORDER — AMOXICILLIN 500 MG PO CAPS: 1000.0000 mg | ORAL_CAPSULE | Freq: Three times a day (TID) | ORAL | 0 refills | Status: AC

## 2023-05-28 MED ORDER — IBUPROFEN 800 MG PO TABS
800.0000 mg | ORAL_TABLET | Freq: Once | ORAL | Status: AC
  Administered 2023-05-28: 800 mg via ORAL
  Filled 2023-05-28: qty 1

## 2023-05-28 MED ORDER — AZITHROMYCIN 250 MG PO TABS: 250.0000 mg | ORAL_TABLET | Freq: Every day | ORAL | 0 refills | Status: AC

## 2023-05-28 MED ORDER — AMOXICILLIN 500 MG PO CAPS
1000.0000 mg | ORAL_CAPSULE | Freq: Once | ORAL | Status: AC
  Administered 2023-05-28: 1000 mg via ORAL
  Filled 2023-05-28: qty 2

## 2023-05-28 NOTE — ED Provider Notes (Signed)
[place] [facility] AT [facility] Provider Note   CSN: [id number] Arrival date & time: 05/28/23  1547     History  Chief Complaint  Patient presents with   Fever    Kamorah Nevils is a 42 y.o. female, pertinent past medical history, who presents to the ED secondary to a fever, body aches, chills, and fatigue has been gone for the last day.  She states she just feels weak, has slight nausea, but denies any abdominal pain, shortness of breath, sore throat, or neck pain.  States she does have a mild headache.  Last took Tylenol at 2 PM today.     Home Medications Prior to Admission medications   Medication Sig Start Date End Date Taking? Authorizing Provider  amoxicillin (AMOXIL) 500 MG capsule Take 2 capsules (1,000 mg total) by mouth 3 (three) times daily. 05/28/23  Yes Ruqaya Strauss L, PA  azithromycin (ZITHROMAX) 250 MG tablet Take 1 tablet (250 mg total) by mouth daily. Take first 2 tablets together, then 1 every day until finished. 05/28/23  Yes Shawntina Diffee L, PA  benzonatate (TESSALON) 100 MG capsule Take 1 capsule (100 mg total) by mouth every 8 (eight) hours. 08/01/22   Blue, Soijett A, PA-C  fluconazole (DIFLUCAN) 150 MG tablet Take 1 tablet (150 mg total) by mouth once as needed for up to 1 dose (if having yeast symptoms). 12/27/22   Kommor, Madison, MD  levothyroxine (SYNTHROID) 100 MCG tablet Take 100 mcg by mouth daily. 07/29/20   [provider]  naproxen (NAPROSYN) 375 MG tablet Take 1 tablet (375 mg total) by mouth 2 (two) times daily. 12/27/22   Kommor, Madison, MD  oseltamivir (TAMIFLU) 75 MG capsule Take 1 capsule (75 mg total) by mouth every 12 (twelve) hours. 08/01/22   Blue, Soijett A, PA-C  iron polysaccharides (NIFEREX) 150 MG capsule Take 1 capsule (150 mg total) by mouth daily. Patient not taking: Reported on 07/02/2018 03/30/18 01/02/21  Gunnar Bulla, CNM      Allergies    Patient has no known allergies.     Review of Systems   Review of Systems  Constitutional:  Positive for fever.    Physical Exam Updated Vital Signs BP 134/86   Pulse 87   Temp 99.3 F (37.4 C) (Oral)   Resp 18   SpO2 97%   Breastfeeding No  Physical Exam Vitals and nursing note reviewed.  Constitutional:      General: She is not in acute distress.    Appearance: She is well-developed. She is diaphoretic.  HENT:     Head: Normocephalic and atraumatic.  Eyes:     Conjunctiva/sclera: Conjunctivae normal.  Neck:     Comments: No ttp of cervical spine. Good flexion and extension of neck.  Cardiovascular:     Rate and Rhythm: Normal rate and regular rhythm.     Heart sounds: No murmur heard. Pulmonary:     Effort: Pulmonary effort is normal. No respiratory distress.     Breath sounds: Normal breath sounds.  Abdominal:     Palpations: Abdomen is soft.     Tenderness: There is no abdominal tenderness.  Musculoskeletal:        General: No swelling.     Cervical back: Neck supple. No rigidity or tenderness. Normal range of motion.  Skin:    General: Skin is warm.     Capillary Refill: Capillary refill takes less than 2 seconds.  Neurological:     Mental Status: She  is alert.  Psychiatric:        Mood and Affect: Mood normal.     ED Results / Procedures / Treatments   Labs (all labs ordered are listed, but only abnormal results are displayed) Labs Reviewed  URINALYSIS, ROUTINE W REFLEX MICROSCOPIC - Abnormal; Notable for the following components:      Result Value   APPearance HAZY (*)    Hgb urine dipstick LARGE (*)    Protein, ur TRACE (*)    Leukocytes,Ua TRACE (*)    Bacteria, UA RARE (*)    All other components within normal limits  RESP PANEL BY RT-PCR (RSV, FLU A&B, COVID)  RVPGX2  PREGNANCY, URINE    EKG None  Radiology DG Chest Port 1 View  Result Date: 05/28/2023 CLINICAL DATA:  Fever. EXAM: PORTABLE CHEST 1 VIEW COMPARISON:  Radiograph 03/26/2023 FINDINGS: Patchy opacity in the  left perihilar and infrahilar lung, suspicious for pneumonia. There also minimal ill-defined subsegmental opacities at the right lung base. Lower lung volumes from prior exam. Normal heart size for technique. No pleural effusion, pulmonary edema, or pneumothorax. Stable osseous structures. IMPRESSION: Patchy opacity in the left perihilar and infrahilar lung, suspicious for pneumonia. Minimal ill-defined subsegmental opacities at the right lung base, favor atelectasis. Electronically Signed   By: Narda Rutherford M.D.   On: 05/28/2023 19:22    Procedures Procedures    Medications Ordered in ED Medications  amoxicillin (AMOXIL) capsule 1,000 mg (has no administration in time range)  ibuprofen (ADVIL) tablet 800 mg (800 mg Oral Given 05/28/23 1648)    ED Course/ Medical Decision Making/ A&P                                 Medical Decision Making Patient is a 42 year old female, here for fever, headache, cough, that is been going on for the last day.  She states she just feels very unwell, and is feels very fatigued.  Denies any abdominal pain, shortness of breath or chest pain.  Appears diaphoretic, but is febrile at 102 F.  Given ibuprofen.  We will obtain a chest x-ray, as well as a urinalysis, and COVID/flu for further evaluation  Amount and/or Complexity of Data Reviewed Labs: ordered.    Details: Urinalysis unremarkable, COVID/flu negative Radiology: ordered.    Details: Chest x-ray concerning for left hilar pneumonia Discussion of management or test interpretation with external provider(s): Discussed with patient, she is feeling better after the ibuprofen, her temperature is went down, and she is able to tolerate p.o. intake.  She is overall improved, appearing, and able to take pills.  Will give her first dose of amoxicillin here, and have her follow-up with her primary care doctor.  Sent home on amoxicillin and azithromycin, for further coverage.  We discussed return precautions she  voiced understanding.  She is not requiring any oxygen, and her tachycardia resolved, after the ibuprofen.  Risk Prescription drug management.    Final Clinical Impression(s) / ED Diagnoses Final diagnoses:  Pneumonia of left upper lobe due to infectious organism    Rx / DC Orders ED Discharge Orders          Ordered    amoxicillin (AMOXIL) 500 MG capsule  3 times daily        05/28/23 1945    azithromycin (ZITHROMAX) 250 MG tablet  Daily        05/28/23 1945  Pete Pelt, [place] 05/28/23 1948    Benjiman Core, MD 05/28/23 2236

## 2023-05-28 NOTE — ED Notes (Signed)
 RN reviewed discharge instructions with pt. Pt verbalized understanding and had no further questions. VSS upon discharge.  

## 2023-05-28 NOTE — ED Triage Notes (Signed)
Since yesterday, headache, nausea, weak, ill feeling fever max 104.5 at home. Took tylenol at 2pm.

## 2023-05-28 NOTE — Discharge Instructions (Addendum)
Your chest x-ray is concerning for pneumonia, I have resent you some antibiotics to the pharmacy, please take these.  Return to the ER if you develop severe shortness of breath, chest pain, confusion, or if your symptoms are not improving in the next 48 hours.

## 2023-12-18 DIAGNOSIS — R1030 Lower abdominal pain, unspecified: Secondary | ICD-10-CM | POA: Diagnosis not present

## 2023-12-18 DIAGNOSIS — N390 Urinary tract infection, site not specified: Secondary | ICD-10-CM | POA: Diagnosis not present
